# Patient Record
Sex: Female | Born: 1940 | Race: White | Hispanic: No | Smoking: Never smoker
Health system: Southern US, Community
[De-identification: ages and names within clinical notes are randomized; demographics above are authoritative.]

## PROBLEM LIST (undated history)

## (undated) DIAGNOSIS — F32A Depression, unspecified: Secondary | ICD-10-CM

## (undated) DIAGNOSIS — Z8489 Family history of other specified conditions: Secondary | ICD-10-CM

## (undated) DIAGNOSIS — Z87442 Personal history of urinary calculi: Secondary | ICD-10-CM

## (undated) DIAGNOSIS — R42 Dizziness and giddiness: Secondary | ICD-10-CM

## (undated) DIAGNOSIS — I10 Essential (primary) hypertension: Secondary | ICD-10-CM

## (undated) DIAGNOSIS — F329 Major depressive disorder, single episode, unspecified: Secondary | ICD-10-CM

## (undated) DIAGNOSIS — M1990 Unspecified osteoarthritis, unspecified site: Secondary | ICD-10-CM

## (undated) DIAGNOSIS — E785 Hyperlipidemia, unspecified: Secondary | ICD-10-CM

## (undated) DIAGNOSIS — E039 Hypothyroidism, unspecified: Secondary | ICD-10-CM

## (undated) HISTORY — DX: Essential (primary) hypertension: I10

## (undated) HISTORY — DX: Hyperlipidemia, unspecified: E78.5

---

## 2004-09-11 ENCOUNTER — Ambulatory Visit: Payer: Self-pay | Admitting: Unknown Physician Specialty

## 2005-02-19 LAB — HM COLONOSCOPY: HM Colonoscopy: NORMAL

## 2005-03-08 ENCOUNTER — Ambulatory Visit: Payer: Self-pay | Admitting: Unknown Physician Specialty

## 2005-03-15 ENCOUNTER — Ambulatory Visit: Payer: Self-pay | Admitting: Unknown Physician Specialty

## 2005-12-03 ENCOUNTER — Ambulatory Visit: Payer: Self-pay | Admitting: Unknown Physician Specialty

## 2006-06-07 ENCOUNTER — Ambulatory Visit: Payer: Self-pay | Admitting: Unknown Physician Specialty

## 2006-07-09 HISTORY — PX: HYSTEROSCOPY: SHX211

## 2007-06-18 ENCOUNTER — Ambulatory Visit: Payer: Self-pay | Admitting: Unknown Physician Specialty

## 2007-07-10 HISTORY — PX: BREAST BIOPSY: SHX20

## 2007-07-10 HISTORY — PX: BREAST SURGERY: SHX581

## 2007-07-10 HISTORY — PX: ROTATOR CUFF REPAIR: SHX139

## 2007-07-17 ENCOUNTER — Ambulatory Visit: Payer: Self-pay | Admitting: Surgery

## 2007-12-17 ENCOUNTER — Ambulatory Visit: Payer: Self-pay | Admitting: Surgery

## 2008-06-23 ENCOUNTER — Ambulatory Visit: Payer: Self-pay | Admitting: Unknown Physician Specialty

## 2009-06-29 ENCOUNTER — Ambulatory Visit: Payer: Self-pay | Admitting: Unknown Physician Specialty

## 2009-07-13 ENCOUNTER — Ambulatory Visit: Payer: Self-pay | Admitting: Unknown Physician Specialty

## 2010-07-19 ENCOUNTER — Ambulatory Visit: Payer: Self-pay | Admitting: Unknown Physician Specialty

## 2011-05-11 LAB — HM PAP SMEAR: HM Pap smear: NORMAL

## 2011-05-25 LAB — HM PAP SMEAR: HM Pap smear: NORMAL

## 2011-07-25 ENCOUNTER — Ambulatory Visit: Payer: Self-pay | Admitting: Unknown Physician Specialty

## 2011-07-25 LAB — HM MAMMOGRAPHY: HM Mammogram: NORMAL

## 2011-08-16 ENCOUNTER — Ambulatory Visit: Payer: Self-pay | Admitting: Internal Medicine

## 2012-02-20 ENCOUNTER — Ambulatory Visit (INDEPENDENT_AMBULATORY_CARE_PROVIDER_SITE_OTHER): Payer: BC Managed Care – PPO | Admitting: Internal Medicine

## 2012-02-20 ENCOUNTER — Encounter: Payer: Self-pay | Admitting: Internal Medicine

## 2012-02-20 VITALS — BP 122/66 | HR 66 | Temp 97.9°F | Resp 14 | Ht 61.0 in | Wt 161.0 lb

## 2012-02-20 DIAGNOSIS — E785 Hyperlipidemia, unspecified: Secondary | ICD-10-CM

## 2012-02-20 DIAGNOSIS — K061 Gingival enlargement: Secondary | ICD-10-CM

## 2012-02-20 DIAGNOSIS — I10 Essential (primary) hypertension: Secondary | ICD-10-CM

## 2012-02-20 DIAGNOSIS — K055 Other periodontal diseases: Secondary | ICD-10-CM

## 2012-02-20 NOTE — Progress Notes (Signed)
Patient ID: Mary Vasquez, female   DOB: December 14, 1940, 71 y.o.   MRN: [id number]   Patient Active Problem List  Diagnosis  . Hyperlipidemia  . Hypertension  . Gingival hyperplasia  . Hyperlipidemia LDL goal < 100    Subjective:  CC:   Chief Complaint  Patient presents with  . New Patient    HPI:   Mary Vasquez is a 71 y.o. female who presents as a new patient to establish primary care with the chief complaint of  gingival hyperplasia. Mary Vasquez is a delightful 71 year old white female who is Transferring  From [employer] after 20 years . She has ahHistory of hypertension and hyperlipidemia and was noted to have developed gingival hyperplasia recently by her dentist which has been attributed to amlodipine. She's been taking amlodipine for control of hypertension for the past 4 or 5 years prior trial of hydrochlorothiazide apparently caused dizziness. She continues to work part-time for [employer] in [place]. She has no other complaints today.   Past Medical History  Diagnosis Date  . Hyperlipidemia   . Hypertension     Past Surgical History  Procedure Date  . Hysteroscopy 2008  . Rotator cuff repair 2009    Ted Armour  . Breast surgery 2009    stereotactic,  left,  benign     Family History  Problem Relation Age of Onset  . Heart disease Mother 49    ami  . Heart disease Father     ami  . Diabetes Brother   . Cancer Brother     prostate ca    History   Social History  . Marital Status: Divorced    Spouse Name: N/A    Number of Children: N/A  . Years of Education: N/A   Occupational History  . Not on file.   Social History Main Topics  . Smoking status: Never Smoker   . Smokeless tobacco: Never Used  . Alcohol Use: 4.2 oz/week    7 Glasses of wine per week  . Drug Use: No  . Sexually Active: Not on file   Other Topics Concern  . Not on file   Social History Narrative  . No narrative on file    Allergies  Allergen Reactions  .  Augmentin (Amoxicillin-Pot Clavulanate) Hives    Review of Systems:   The remainder of the review of systems was negative except those addressed in the HPI.    Objective:  BP 122/66  Pulse 66  Temp 97.9 F (36.6 C) (Oral)  Resp 14  Ht 5\' 1"  (1.549 m)  Wt 161 lb (73.029 kg)  BMI 30.42 kg/m2  SpO2 96%  General appearance: alert, cooperative and appears stated age Ears: normal TM's and external ear canals both ears Throat: lips, mucosa, and tongue normal; teeth and gums normal Neck: no adenopathy, no carotid bruit, supple, symmetrical, trachea midline and thyroid not enlarged, symmetric, no tenderness/mass/nodules Back: symmetric, no curvature. ROM normal. No CVA tenderness. Lungs: clear to auscultation bilaterally Heart: regular rate and rhythm, S1, S2 normal, no murmur, click, rub or gallop Abdomen: soft, non-tender; bowel sounds normal; no masses,  no organomegaly Pulses: 2+ and symmetric Skin: Skin color, texture, turgor normal. No rashes or lesions Lymph nodes: Cervical, supraclavicular, and axillary nodes normal.  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 We have discontinued her amlodipine today. She will follow up with her dentist as directed.  Hypertension She would like to  try a three-month break from  any blood pressure medication and follow her blood pressures and resume a medication if indicated for blood pressure greater than 140/80. I have agreed to this.  Hyperlipidemia LDL goal < 100 She's been taking atorvastatin for primary prevention for years. She has no history of strokes TIAs or other disease or diabetes. She would like to suspend her medication and recheck her cholesterol in 3 months I have agreed to this.   Updated Medication List Outpatient Encounter Prescriptions as of 02/20/2012  Medication Sig Dispense Refill  . atorvastatin (LIPITOR) 10 MG tablet Take 10 mg by mouth daily.        . calcium carbonate (OS-CAL) 600 MG TABS Take 600 mg by mouth 2 (two) times daily with a meal.      . cetirizine (ZYRTEC) 10 MG tablet Take 10 mg by mouth daily.      . Cholecalciferol (VITAMIN D-3) 1000 UNITS CAPS Take by mouth 2 (two) times daily.      . Multiple Vitamin (MULTIVITAMIN) tablet Take 1 tablet by mouth daily.      Marland Kitchen DISCONTD: amLODipine (NORVASC) 5 MG tablet Take 5 mg by mouth daily.         Orders Placed This Encounter  Procedures  . HM MAMMOGRAPHY  . HM PAP SMEAR  . HM COLONOSCOPY    No Follow-up on file.

## 2012-02-20 NOTE — Patient Instructions (Addendum)
Suspend amlodipine  and atorvastatin  for 3 months (pending the urinalysis).  Strive for 30 minutes of exercise 5 days per week.   We will repeat fasting lipids,  CMET in 3 months   Consider a Low Glycemic Index Diet and eating 6 smaller meals daily .  This frequent feeding stimulates your metabolism and the lower glycemic index foods will lower your blood sugars:   This is an example of my daily  "Low GI"  Diet:  All of the foods can be found at grocery stores and in bulk at [employer]  club   7 AM Breakfast:  Low carbohydrate Protein  Shakes (I recommend the E[employer]vantEdge "Carb Control" shakes  Or the low carb shakes by Atkins.   Both are available everywhere:  In  cases at [employer]  Or in 4 packs at grocery stores and pharmacies  2.5 carbs  (Alternative is  a toasted Arnold's Sandwhich Thin w/ peanut butter, a "Bagel Thin" with cream cheese and salmon) or  a scrambled egg burrito made with a low carb tortilla .  Avoid cereal and bananas, oatmeal too unless the old fashioned kind that takes 30-40 minutes to prepare.  the rest is overly processed, has minimal fiber, and loaded with carbohydrates!   10 AM: Protein bar by Atkins (the snack size, under 200 cal.  There are many varieties , available widely again or in bulk in limited varieties at [employer])  Other so called "protein bars" tend to be loaded with carbohydrates.  Remember, in food advertising, the word "energy" is synonymous for " carbohydrate."  Lunch: sandwich of [place], (or any lunchmeat or canned tuna), fresh avocado and cheese on a lower carbohydrate pita bread, flatbread, or tortilla . Ok to use mayonnaise. The bread is the only source or carbohydrate that can be decreased (Joseph's makes a pita bread and a flat bread  Are 50 cal and 4 net carbs ; Toufayan makes a low carb flatbread 100 cal and 9 net carbs  and  Mission makes a low carb whole wheat tortilla  210 cal and 6 net carbs)  3 PM:  Mid day :  Another protein  bar,  Or a  cheese stick (100  cal, 0 carbs),  Or 1 ounce of  almonds, walnuts, pistachios, pecans, peanuts,  Macadamia nuts. Or a Dannon light n Fit greek yogurt, 80 cal 8 net carbs . Avoid "granola"; the dried cranberries and raisins are loaded with carbohydrates.    6 PM  Dinner:  "mean and green:"  Meat/chicken/fish or a high protein legume; , with a green salad, and a low GI  Veggie (broccoli, cauliflower, green beans, spinach, brussel sprouts. Lima beans) : Avoid "Low fat dressings, Reyne Dumas and [address]! They are loaded with sugar! Instead use ranch, vinagrette,  Blue cheese, etc  9 PM snack : Breyer's "low carb" fudgsicle or  ice cream bar (Carb Smart line), or  Weight Watcher's ice cream bar , or anouther "no sugar added" ice cream; or another protein shake or a serving of fresh fruit with whipped cream (Avoid bananas, pineapple, grapes  and watermelon on a regular basis because they are high in sugar)   Remember that snack Substitutions should be less than 15 to 20 carbs  Per serving. Remember to subtract fiber grams to get the "net carbs."

## 2012-02-21 ENCOUNTER — Encounter: Payer: Self-pay | Admitting: Internal Medicine

## 2012-02-21 DIAGNOSIS — E785 Hyperlipidemia, unspecified: Secondary | ICD-10-CM | POA: Insufficient documentation

## 2012-02-21 DIAGNOSIS — K061 Gingival enlargement: Secondary | ICD-10-CM | POA: Insufficient documentation

## 2012-02-21 NOTE — Assessment & Plan Note (Signed)
She's been taking atorvastatin for primary prevention for years. She has no history of strokes TIAs or other disease or diabetes. She would like to suspend her medication and recheck her cholesterol in 3 months I have agreed to this.

## 2012-02-21 NOTE — Assessment & Plan Note (Signed)
Found recently by her dentist with amlodipine cited as the cause. We have discontinued her amlodipine today. She will follow up with her dentist as directed.

## 2012-02-21 NOTE — Assessment & Plan Note (Signed)
She would like to try a three-month break from  any blood pressure medication and follow her blood pressures and resume a medication if indicated for blood pressure greater than 140/80. I have agreed to this.

## 2013-04-23 ENCOUNTER — Ambulatory Visit (INDEPENDENT_AMBULATORY_CARE_PROVIDER_SITE_OTHER): Payer: Medicare Other | Admitting: Internal Medicine

## 2013-04-23 ENCOUNTER — Encounter: Payer: Self-pay | Admitting: Internal Medicine

## 2013-04-23 ENCOUNTER — Encounter (INDEPENDENT_AMBULATORY_CARE_PROVIDER_SITE_OTHER): Payer: Self-pay

## 2013-04-23 VITALS — BP 132/72 | HR 64 | Temp 97.7°F | Resp 12 | Ht 61.5 in | Wt 157.5 lb

## 2013-04-23 DIAGNOSIS — E559 Vitamin D deficiency, unspecified: Secondary | ICD-10-CM

## 2013-04-23 DIAGNOSIS — Z1211 Encounter for screening for malignant neoplasm of colon: Secondary | ICD-10-CM

## 2013-04-23 DIAGNOSIS — I10 Essential (primary) hypertension: Secondary | ICD-10-CM

## 2013-04-23 DIAGNOSIS — E669 Obesity, unspecified: Secondary | ICD-10-CM

## 2013-04-23 DIAGNOSIS — Z23 Encounter for immunization: Secondary | ICD-10-CM

## 2013-04-23 DIAGNOSIS — A881 Epidemic vertigo: Secondary | ICD-10-CM

## 2013-04-23 DIAGNOSIS — R5381 Other malaise: Secondary | ICD-10-CM

## 2013-04-23 DIAGNOSIS — Z1239 Encounter for other screening for malignant neoplasm of breast: Secondary | ICD-10-CM

## 2013-04-23 DIAGNOSIS — M899 Disorder of bone, unspecified: Secondary | ICD-10-CM

## 2013-04-23 DIAGNOSIS — K061 Gingival enlargement: Secondary | ICD-10-CM

## 2013-04-23 DIAGNOSIS — K055 Other periodontal diseases: Secondary | ICD-10-CM

## 2013-04-23 DIAGNOSIS — M8580 Other specified disorders of bone density and structure, unspecified site: Secondary | ICD-10-CM

## 2013-04-23 DIAGNOSIS — E785 Hyperlipidemia, unspecified: Secondary | ICD-10-CM

## 2013-04-23 DIAGNOSIS — Z0000 Encounter for general adult medical examination without abnormal findings: Secondary | ICD-10-CM

## 2013-04-23 MED ORDER — ZOSTER VACCINE LIVE 19400 UNT/0.65ML ~~LOC~~ SOLR: 0.6500 mL | Freq: Once | SUBCUTANEOUS | Status: DC

## 2013-04-23 NOTE — Patient Instructions (Signed)
You had your annual Medicare wellness exam today  We will schedule your mammogram in December at [facility] with the 3d mammogram, and your DEXA scan at [facility]   I recommend taking the home fecal test  For your interim  colon CA screening test since your next colonoscopy is not due until 2014.  We will give it to you at your lab visit. .   I recommend the Shingles vaccine.  I have given you prescription for this because it will be cheaper at the health Dept or at your  local pharmacy because Medicare will not reimburse for them.   You received the pneumonia vaccine today.  Please return for fasting labs ASAP

## 2013-04-23 NOTE — Progress Notes (Signed)
Patient ID: Mary Vasquez, female   DOB: May 19, 1941, 72 y.o.   MRN: [id number]   The patient is here for annual Medicare wellness examination and management of other chronic and acute problems. 1)   1) History of obesity.  She is trying to lose weight .    Her nadir was 151 lbs in the summer but has gained back 5.  Not exercising as much due to work and family obligations and distractions.  Diet reviewed She is drinkjing Using smoothies (spinach, frozen berries, and flax seed) in ealry am.,   Then 10 am snack of nuts or berries.  Sleeps great ,  Bowels moving regularly  2( 2 recent  episodes of "vertigo" in the past 5 months.  Occurred at work , Urgent Care eval was normal .   given meclizine by Urgent Care. Cleared after 4 days.  2nd occurrence in August .  Sudden onset of room spinning .  Both episodes  occurred during time of seasonal allergies,  But takes zyrtec regularly.  no headache or vision changes associated. Due for eye exam . Sister had a brain tumor.    The risk factors are reflected in the social history.  The roster of all physicians providing medical care to patient - is listed in the Snapshot section of the chart.  Activities of daily living:  The patient is 100% independent in all ADLs: dressing, toileting, feeding as well as independent mobility  Home safety : The patient has smoke detectors in the home. They wear seatbelts.  There are no firearms at home. There is no violence in the home.   There is no risks for hepatitis, STDs or HIV. There is no   history of blood transfusion. They have no travel history to infectious disease endemic areas of the world.  The patient has seen their dentist in the last six month. They have seen their eye doctor in the last year. They admit to slight hearing difficulty with regard to whispered voices and some television programs.  They have deferred audiologic testing in the last year.  They do not  have excessive sun exposure. Discussed the need  for sun protection: hats, long sleeves and use of sunscreen if there is significant sun exposure.   Diet: the importance of a healthy diet is discussed. They do have a healthy diet.  The benefits of regular aerobic exercise were discussed. She walks 4 times per week ,  20 minutes.   Depression screen: there are no signs or vegative symptoms of depression- irritability, change in appetite, anhedonia, sadness/tearfullness.  Cognitive assessment: the patient manages all their financial and personal affairs and is actively engaged. They could relate day,date,year and events; recalled 2/3 objects at 3 minutes; performed clock-face test normally. Perfomed very well on the 1 minute naming tests   The following portions of the patient's history were reviewed and updated as appropriate: allergies, current medications, past family history, past medical history,  past surgical history, past social history  and problem list.  Visual acuity was not assessed per patient preference since she has regular follow up with her ophthalmologist. Hearing and body mass index were assessed and reviewed.   During the course of the visit the patient was educated and counseled about appropriate screening and preventive services including : fall prevention , diabetes screening, nutrition counseling, colorectal cancer screening, and recommended immunizations.    Objective:  BP 132/72  Pulse 64  Temp(Src) 97.7 F (36.5 C) (Oral)  Resp 12  Ht 5' 1.5" (1.562 m)  Wt 157 lb 8 oz (71.442 kg)  BMI 29.28 kg/m2  SpO2 98%  General Appearance:    Alert, cooperative, no distress, appears stated age  Head:    Normocephalic, without obvious abnormality, atraumatic  Eyes:    PERRL, conjunctiva/corneas clear, EOM's intact, fundi    benign, both eyes  Ears:    Normal TM's and external ear canals, both ears  Nose:   Nares normal, septum midline, mucosa normal, no drainage    or sinus tenderness  Throat:   Lips, mucosa, and  tongue normal; teeth and gums normal  Neck:   Supple, symmetrical, trachea midline, no adenopathy;    thyroid:  no enlargement/tenderness/nodules; no carotid   bruit or JVD  Back:     Symmetric, no curvature, ROM normal, no CVA tenderness  Lungs:     Clear to auscultation bilaterally, respirations unlabored  Chest Wall:    No tenderness or deformity   Heart:    Regular rate and rhythm, S1 and S2 normal, no murmur, rub   or gallop  Breast Exam:    No tenderness, masses, or nipple abnormality  Abdomen:     Soft, non-tender, bowel sounds active all four quadrants,    no masses, no organomegaly     Extremities:   Extremities normal, atraumatic, no cyanosis or edema  Pulses:   2+ and symmetric all extremities  Skin:   Skin color, texture, turgor normal, no rashes or lesions  Lymph nodes:   Cervical, supraclavicular, and axillary nodes normal  Neurologic:   CNII-XII intact, normal strength, sensation and reflexes    throughout   Assessment and Plan:  Hypertension Currently well-controlled without medications. Her home blood pressures have been averaging 134/74 without  medications which were  stopped due to gingival hyperplasia which was noted by her dentist.   Obesity, unspecified She has lost 4 pounds since her last visit over a year ago and her BMI is now 21.I have addressed  BMI and recommended a low glycemic index diet utilizing smaller more frequent meals to increase metabolism.  I have also recommended that patient start exercising with a goal of 30 minutes of aerobic exercise a minimum of 5 days per week. Screening for lipid disorders, thyroid and diabetes to be done today.    Gingival hyperplasia Improving with cessation of amlodipine for hypertensive therapy.  Hyperlipidemia LDL goal < 100 She suspended atorvastatin at last visit and is due to return for fasting lipids.'s been taking atorvastatin for primary prevention for years. She has no history of strokes TIAs or other  disease or diabetes.  Epidemic vertigo Previously described episodes appear to have been mediated by eustachian tube dysfunction and allergic rhinitis. There were no associated worrisome  symptoms of  changes in vision, chest pain or palpitations.  Encounter for preventive health examination Annual comprehensive exam was done including breast, excluding pelvic and PAP smear. All screenings have been addressed .    Updated Medication List Outpatient Encounter Prescriptions as of 04/23/2013  Medication Sig Dispense Refill  . calcium carbonate (OS-CAL) 600 MG TABS Take 600 mg by mouth 2 (two) times daily with a meal.      . cetirizine (ZYRTEC) 10 MG tablet Take 10 mg by mouth daily.      . Cholecalciferol (VITAMIN D-3) 1000 UNITS CAPS Take by mouth 2 (two) times daily.      . Multiple Vitamin (MULTIVITAMIN) tablet Take 1 tablet by mouth daily.      Marland Kitchen  zoster vaccine live, PF, (ZOSTAVAX) 16109 UNT/0.65ML injection Inject 19,400 Units into the skin once.  1 each  0  . [DISCONTINUED] atorvastatin (LIPITOR) 10 MG tablet Take 10 mg by mouth daily.       No facility-administered encounter medications on file as of 04/23/2013.

## 2013-04-23 NOTE — Assessment & Plan Note (Addendum)
Currently well-controlled without medications. Her home blood pressures have been averaging 134/74 without  medications which were  stopped due to gingival hyperplasia which was noted by her dentist.

## 2013-04-25 DIAGNOSIS — A881 Epidemic vertigo: Secondary | ICD-10-CM | POA: Insufficient documentation

## 2013-04-25 DIAGNOSIS — E663 Overweight: Secondary | ICD-10-CM | POA: Insufficient documentation

## 2013-04-25 DIAGNOSIS — E669 Obesity, unspecified: Secondary | ICD-10-CM | POA: Insufficient documentation

## 2013-04-25 DIAGNOSIS — Z01818 Encounter for other preprocedural examination: Secondary | ICD-10-CM | POA: Insufficient documentation

## 2013-04-25 NOTE — Assessment & Plan Note (Signed)
Improving with cessation of amlodipine for hypertensive therapy.

## 2013-04-25 NOTE — Assessment & Plan Note (Signed)
Previously described episodes appear to have been mediated by eustachian tube dysfunction and allergic rhinitis. There were no associated worrisome  symptoms of  changes in vision, chest pain or palpitations.

## 2013-04-25 NOTE — Assessment & Plan Note (Signed)
She has lost 4 pounds since her last visit over a year ago and her BMI is now 30.I have addressed  BMI and recommended a low glycemic index diet utilizing smaller more frequent meals to increase metabolism.  I have also recommended that patient start exercising with a goal of 30 minutes of aerobic exercise a minimum of 5 days per week. Screening for lipid disorders, thyroid and diabetes to be done today.

## 2013-04-25 NOTE — Assessment & Plan Note (Signed)
Annual comprehensive exam was done including breast, excluding pelvic and PAP smear. All screenings have been addressed .  

## 2013-04-25 NOTE — Assessment & Plan Note (Signed)
She suspended atorvastatin at last visit and is due to return for fasting lipids.'s been taking atorvastatin for primary prevention for years. She has no history of strokes TIAs or other disease or diabetes.

## 2013-04-30 ENCOUNTER — Other Ambulatory Visit (INDEPENDENT_AMBULATORY_CARE_PROVIDER_SITE_OTHER): Payer: Medicare Other

## 2013-04-30 DIAGNOSIS — E559 Vitamin D deficiency, unspecified: Secondary | ICD-10-CM

## 2013-04-30 DIAGNOSIS — E785 Hyperlipidemia, unspecified: Secondary | ICD-10-CM

## 2013-04-30 DIAGNOSIS — R5381 Other malaise: Secondary | ICD-10-CM

## 2013-04-30 LAB — CBC WITH DIFFERENTIAL/PLATELET
Basophils Relative: 1.1 % (ref 0.0–3.0)
Eosinophils Absolute: 0.2 10*3/uL (ref 0.0–0.7)
Eosinophils Relative: 5.1 % — ABNORMAL HIGH (ref 0.0–5.0)
HCT: 39.2 % (ref 36.0–46.0)
Lymphs Abs: 1.5 10*3/uL (ref 0.7–4.0)
MCHC: 34.2 g/dL (ref 30.0–36.0)
MCV: 91.2 fl (ref 78.0–100.0)
Monocytes Absolute: 0.3 10*3/uL (ref 0.1–1.0)
Neutro Abs: 1.6 10*3/uL (ref 1.4–7.7)
Neutrophils Relative %: 43.4 % (ref 43.0–77.0)
Platelets: 217 10*3/uL (ref 150.0–400.0)
RDW: 14 % (ref 11.5–14.6)

## 2013-04-30 LAB — COMPREHENSIVE METABOLIC PANEL
AST: 25 U/L (ref 0–37)
Albumin: 3.7 g/dL (ref 3.5–5.2)
Alkaline Phosphatase: 58 U/L (ref 39–117)
BUN: 11 mg/dL (ref 6–23)
Creatinine, Ser: 0.8 mg/dL (ref 0.4–1.2)
GFR: 70.93 mL/min (ref >60.00)
Glucose, Bld: 98 mg/dL (ref 70–99)
Potassium: 4.8 mEq/L (ref 3.5–5.1)
Sodium: 140 mEq/L (ref 135–145)
Total Bilirubin: 0.6 mg/dL (ref 0.3–1.2)
Total Protein: 6.5 g/dL (ref 6.0–8.3)

## 2013-04-30 LAB — LIPID PANEL
Cholesterol: 235 mg/dL — ABNORMAL HIGH (ref 0–200)
HDL: 59.4 mg/dL (ref >39.00)
Total CHOL/HDL Ratio: 4
Triglycerides: 80 mg/dL (ref 0.0–149.0)
VLDL: 16 mg/dL (ref 0.0–40.0)

## 2013-04-30 LAB — TSH: TSH: 3.78 u[IU]/mL (ref 0.35–5.50)

## 2013-05-03 ENCOUNTER — Encounter: Payer: Self-pay | Admitting: Internal Medicine

## 2013-05-04 MED ORDER — ATORVASTATIN CALCIUM 40 MG PO TABS: 40.0000 mg | ORAL_TABLET | Freq: Every day | ORAL | Status: DC

## 2013-06-12 ENCOUNTER — Telehealth: Payer: Self-pay | Admitting: Internal Medicine

## 2013-06-12 NOTE — Telephone Encounter (Signed)
Pt would like you to schedule her mammogram and dexa scan Please call pt

## 2013-06-17 ENCOUNTER — Encounter: Payer: Self-pay | Admitting: Internal Medicine

## 2013-06-17 ENCOUNTER — Encounter: Payer: Self-pay | Admitting: Emergency Medicine

## 2013-09-01 ENCOUNTER — Encounter: Payer: Self-pay | Admitting: Internal Medicine

## 2013-10-16 ENCOUNTER — Ambulatory Visit: Payer: Self-pay | Admitting: Internal Medicine

## 2013-12-03 ENCOUNTER — Encounter: Payer: Self-pay | Admitting: Internal Medicine

## 2014-06-14 ENCOUNTER — Ambulatory Visit (INDEPENDENT_AMBULATORY_CARE_PROVIDER_SITE_OTHER): Payer: Self-pay | Admitting: Internal Medicine

## 2014-06-14 ENCOUNTER — Encounter: Payer: Self-pay | Admitting: Internal Medicine

## 2014-06-14 VITALS — BP 140/82 | HR 68 | Temp 98.1°F | Resp 16 | Ht 62.0 in | Wt 163.0 lb

## 2014-06-14 DIAGNOSIS — R21 Rash and other nonspecific skin eruption: Secondary | ICD-10-CM

## 2014-06-14 DIAGNOSIS — N6310 Unspecified lump in the right breast, unspecified quadrant: Secondary | ICD-10-CM | POA: Insufficient documentation

## 2014-06-14 DIAGNOSIS — E669 Obesity, unspecified: Secondary | ICD-10-CM

## 2014-06-14 DIAGNOSIS — E559 Vitamin D deficiency, unspecified: Secondary | ICD-10-CM

## 2014-06-14 DIAGNOSIS — Z1159 Encounter for screening for other viral diseases: Secondary | ICD-10-CM

## 2014-06-14 DIAGNOSIS — N63 Unspecified lump in breast: Secondary | ICD-10-CM

## 2014-06-14 DIAGNOSIS — Z0000 Encounter for general adult medical examination without abnormal findings: Secondary | ICD-10-CM

## 2014-06-14 DIAGNOSIS — E785 Hyperlipidemia, unspecified: Secondary | ICD-10-CM

## 2014-06-14 DIAGNOSIS — Z23 Encounter for immunization: Secondary | ICD-10-CM

## 2014-06-14 LAB — COMPREHENSIVE METABOLIC PANEL
ALBUMIN: 4.2 g/dL (ref 3.5–5.2)
ALK PHOS: 64 U/L (ref 39–117)
ALT: 27 U/L (ref 0–35)
AST: 36 U/L (ref 0–37)
BILIRUBIN TOTAL: 0.7 mg/dL (ref 0.2–1.2)
BUN: 16 mg/dL (ref 6–23)
CO2: 27 mEq/L (ref 19–32)
Calcium: 9.3 mg/dL (ref 8.4–10.5)
Chloride: 105 mEq/L (ref 96–112)
Creatinine, Ser: 0.9 mg/dL (ref 0.4–1.2)
GFR: 65.29 mL/min (ref >60.00)
Glucose, Bld: 114 mg/dL — ABNORMAL HIGH (ref 70–99)
POTASSIUM: 4.8 meq/L (ref 3.5–5.1)
SODIUM: 139 meq/L (ref 135–145)
Total Protein: 7 g/dL (ref 6.0–8.3)

## 2014-06-14 LAB — LIPID PANEL
CHOLESTEROL: 169 mg/dL (ref 0–200)
HDL: 63.6 mg/dL (ref >39.00)
LDL Cholesterol: 89 mg/dL (ref 0–99)
NonHDL: 105.4
TRIGLYCERIDES: 80 mg/dL (ref 0.0–149.0)
Total CHOL/HDL Ratio: 3
VLDL: 16 mg/dL (ref 0.0–40.0)

## 2014-06-14 LAB — VITAMIN D 25 HYDROXY (VIT D DEFICIENCY, FRACTURES): VITD: 32.99 ng/mL (ref 30.00–100.00)

## 2014-06-14 LAB — TSH: TSH: 3.75 u[IU]/mL (ref 0.35–4.50)

## 2014-06-14 MED ORDER — TRIAMCINOLONE ACETONIDE 0.1 % EX CREA: 1.0000 "application " | TOPICAL_CREAM | Freq: Two times a day (BID) | CUTANEOUS | Status: DC

## 2014-06-14 NOTE — Addendum Note (Signed)
Addended by: Dennie BibleAVIS, Tramaine Snell R on: 06/14/2014 10:11 AM   Modules accepted: Orders

## 2014-06-14 NOTE — Assessment & Plan Note (Signed)
She hs been inconsistent with use of lipitor,  No intolerances noted. Repeat labs due today

## 2014-06-14 NOTE — Patient Instructions (Addendum)
You had your annual Medicare wellness exam today  We will schedule your mammogram soon and refer you to Dr Kellie Moor for the recurring rash.  You can use the ointment twice daily,  And add benadryl at bedtime for the itching.  You received the pneumonia vaccine today.  We will contact you with the bloodwork results   Health Maintenance Adopting a healthy lifestyle and getting preventive care can go a long way to promote health and wellness. Talk with your health care provider about what schedule of regular examinations is right for you. This is a good chance for you to check in with your provider about disease prevention and staying healthy. In between checkups, there are plenty of things you can do on your own. Experts have done a lot of research about which lifestyle changes and preventive measures are most likely to keep you healthy. Ask your health care provider for more information. WEIGHT AND DIET  Eat a healthy diet  Be sure to include plenty of vegetables, fruits, low-fat dairy products, and lean protein.  Do not eat a lot of foods high in solid fats, added sugars, or salt.  Get regular exercise. This is one of the most important things you can do for your health.  Most adults should exercise for at least 150 minutes each week. The exercise should increase your heart rate and make you sweat (moderate-intensity exercise).  Most adults should also do strengthening exercises at least twice a week. This is in addition to the moderate-intensity exercise.  Maintain a healthy weight  Body mass index (BMI) is a measurement that can be used to identify possible weight problems. It estimates body fat based on height and weight. Your health care provider can help determine your BMI and help you achieve or maintain a healthy weight.  For females 20 years of age and older:   A BMI below 18.5 is considered underweight.  A BMI of 18.5 to 24.9 is normal.  A BMI of 25 to 29.9 is considered  overweight.  A BMI of 30 and above is considered obese.  Watch levels of cholesterol and blood lipids  You should start having your blood tested for lipids and cholesterol at 72 years of age, then have this test every 5 years.  You may need to have your cholesterol levels checked more often if:  Your lipid or cholesterol levels are high.  You are older than 73 years of age.  You are at high risk for heart disease.  CANCER SCREENING   Lung Cancer  Lung cancer screening is recommended for adults 65-48 years old who are at high risk for lung cancer because of a history of smoking.  A yearly low-dose CT scan of the lungs is recommended for people who:  Currently smoke.  Have quit within the past 15 years.  Have at least a 30-pack-year history of smoking. A pack year is smoking an average of one pack of cigarettes a day for 1 year.  Yearly screening should continue until it has been 15 years since you quit.  Yearly screening should stop if you develop a health problem that would prevent you from having lung cancer treatment.  Breast Cancer  Practice breast self-awareness. This means understanding how your breasts normally appear and feel.  It also means doing regular breast self-exams. Let your health care provider know about any changes, no matter how small.  If you are in your 20s or 30s, you should have a clinical breast exam (  care provider every 1-3 years as part of a regular health exam.  If you are 40 or older, have a CBE every year. Also consider having a breast X-ray (mammogram) every year.  If you have a family history of breast cancer, talk to your health care provider about genetic screening.  If you are at high risk for breast cancer, talk to your health care provider about having an MRI and a mammogram every year.  Breast cancer gene (BRCA) assessment is recommended for women who have family members with BRCA-related cancers. BRCA-related  cancers include:  Breast.  Ovarian.  Tubal.  Peritoneal cancers.  Results of the assessment will determine the need for genetic counseling and BRCA1 and BRCA2 testing. Cervical Cancer Routine pelvic examinations to screen for cervical cancer are no longer recommended for nonpregnant women who are considered low risk for cancer of the pelvic organs (ovaries, uterus, and vagina) and who do not have symptoms. A pelvic examination may be necessary if you have symptoms including those associated with pelvic infections. Ask your health care provider if a screening pelvic exam is right for you.   The Pap test is the screening test for cervical cancer for women who are considered at risk.  If you had a hysterectomy for a problem that was not cancer or a condition that could lead to cancer, then you no longer need Pap tests.  If you are older than 65 years, and you have had normal Pap tests for the past 10 years, you no longer need to have Pap tests.  If you have had past treatment for cervical cancer or a condition that could lead to cancer, you need Pap tests and screening for cancer for at least 20 years after your treatment.  If you no longer get a Pap test, assess your risk factors if they change (such as having a new sexual partner). This can affect whether you should start being screened again.  Some women have medical problems that increase their chance of getting cervical cancer. If this is the case for you, your health care provider may recommend more frequent screening and Pap tests.  The human papillomavirus (HPV) test is another test that may be used for cervical cancer screening. The HPV test looks for the virus that can cause cell changes in the cervix. The cells collected during the Pap test can be tested for HPV.  The HPV test can be used to screen women 30 years of age and older. Getting tested for HPV can extend the interval between normal Pap tests from three to five  years.  An HPV test also should be used to screen women of any age who have unclear Pap test results.  After 72 years of age, women should have HPV testing as often as Pap tests.  Colorectal Cancer  This type of cancer can be detected and often prevented.  Routine colorectal cancer screening usually begins at 72 years of age and continues through 72 years of age.  Your health care provider may recommend screening at an earlier age if you have risk factors for colon cancer.  Your health care provider may also recommend using home test kits to check for hidden blood in the stool.  A small camera at the end of a tube can be used to examine your colon directly (sigmoidoscopy or colonoscopy). This is done to check for the earliest forms of colorectal cancer.  Routine screening usually begins at age 50.  Direct examination of the   Direct examination of the colon should be repeated every 5-10 years through 72 years of age. However, you may need to be screened more often if early forms of precancerous polyps or small growths are found. Skin Cancer  Check your skin from head to toe regularly.  Tell your health care provider about any new moles or changes in moles, especially if there is a change in a mole's shape or color.  Also tell your health care provider if you have a mole that is larger than the size of a pencil eraser.  Always use sunscreen. Apply sunscreen liberally and repeatedly throughout the day.  Protect yourself by wearing long sleeves, pants, a wide-brimmed hat, and sunglasses whenever you are outside. HEART DISEASE, DIABETES, AND HIGH BLOOD PRESSURE   Have your blood pressure checked at least every 1-2 years. High blood pressure causes heart disease and increases the risk of stroke.  If you are between 66 years and 34 years old, ask your health care provider if you should take aspirin to prevent strokes.  Have regular diabetes screenings. This involves taking a blood sample to check your fasting  blood sugar level.  If you are at a normal weight and have a low risk for diabetes, have this test once every three years after 72 years of age.  If you are overweight and have a high risk for diabetes, consider being tested at a younger age or more often. PREVENTING INFECTION  Hepatitis B  If you have a higher risk for hepatitis B, you should be screened for this virus. You are considered at high risk for hepatitis B if:  You were born in a country where hepatitis B is common. Ask your health care provider which countries are considered high risk.  Your parents were born in a high-risk country, and you have not been immunized against hepatitis B (hepatitis B vaccine).  You have HIV or AIDS.  You use needles to inject street drugs.  You live with someone who has hepatitis B.  You have had sex with someone who has hepatitis B.  You get hemodialysis treatment.  You take certain medicines for conditions, including cancer, organ transplantation, and autoimmune conditions. Hepatitis C  Blood testing is recommended for:  Everyone born from [birth year] through 1964.  Anyone with known risk factors for hepatitis C. Sexually transmitted infections (STIs)  You should be screened for sexually transmitted infections (STIs) including gonorrhea and chlamydia if:  You are sexually active and are younger than 72 years of age.  You are older than 72 years of age and your health care provider tells you that you are at risk for this type of infection.  Your sexual activity has changed since you were last screened and you are at an increased risk for chlamydia or gonorrhea. Ask your health care provider if you are at risk.  If you do not have HIV, but are at risk, it may be recommended that you take a prescription medicine daily to prevent HIV infection. This is called pre-exposure prophylaxis (PrEP). You are considered at risk if:  You are sexually active and do not regularly use condoms or know  the HIV status of your partner(s).  You take drugs by injection.  You are sexually active with a partner who has HIV. Talk with your health care provider about whether you are at high risk of being infected with HIV. If you choose to begin PrEP, you should first be tested for HIV. You should then be  tested every 3 months for as long as you are taking PrEP.  PREGNANCY   If you are premenopausal and you may become pregnant, ask your health care provider about preconception counseling.  If you may become pregnant, take 400 to 800 micrograms (mcg) of folic acid every day.  If you want to prevent pregnancy, talk to your health care provider about birth control (contraception). OSTEOPOROSIS AND MENOPAUSE   Osteoporosis is a disease in which the bones lose minerals and strength with aging. This can result in serious bone fractures. Your risk for osteoporosis can be identified using a bone density scan.  If you are 65 years of age or older, or if you are at risk for osteoporosis and fractures, ask your health care provider if you should be screened.  Ask your health care provider whether you should take a calcium or vitamin D supplement to lower your risk for osteoporosis.  Menopause may have certain physical symptoms and risks.  Hormone replacement therapy may reduce some of these symptoms and risks. Talk to your health care provider about whether hormone replacement therapy is right for you.  HOME CARE INSTRUCTIONS   Schedule regular health, dental, and eye exams.  Stay current with your immunizations.   Do not use any tobacco products including cigarettes, chewing tobacco, or electronic cigarettes.  If you are pregnant, do not drink alcohol.  If you are breastfeeding, limit how much and how often you drink alcohol.  Limit alcohol intake to no more than 1 drink per day for nonpregnant women. One drink equals 12 ounces of beer, 5 ounces of wine, or 1 ounces of hard liquor.  Do not  use street drugs.  Do not share needles.  Ask your health care provider for help if you need support or information about quitting drugs.  Tell your health care provider if you often feel depressed.  Tell your health care provider if you have ever been abused or do not feel safe at home. Document Released: 01/08/2011 Document Revised: 11/09/2013 Document Reviewed: 05/27/2013 Exit[employer] Patient Information 2015 [employer]. This information is not intended to replace advice given to you by your health care provider. Make sure you discuss any questions you have with your health care provider.  

## 2014-06-14 NOTE — Progress Notes (Signed)
Patient ID: Mary Vasquez, female   DOB: May 05, 1941, 73 y.o.   MRN: [id number]    The patient is here for annual Medicare wellness examination and management of other chronic and acute problems.  She has been having a pruritic facial rash since last Friday. [facility] for an allergic reaction when it first occurred in September with prednisone taper and Cereve cream since the first occurrence occurred after pulling up some carpet at home.  She states that the rash never completely  Resolved.  Last Friday developed swollen lips and swelling around the eyes,  Intense itching ,  Also involving eyes, and backs of hand.    Thanksgiving week she had  a viral URI with laryngitis,  Has been taking zyrtec and mucinex.  No new medications,  Creams ,  Detergents or supplements,  No new  moisturizers.  Had hair colored on Thursday    Not taking lipitor reguarly ,  Not exercisig due to increased workload.  Working 3 days per week at [employer] .  Not exercising the other 4 days .  likes to walk but  has just gotten out of the habit .   The risk factors are reflected in the social history.  The roster of all physicians providing medical care to patient - is listed in the Snapshot section of the chart.  Activities of daily living:  The patient is 100% independent in all ADLs: dressing, toileting, feeding as well as independent mobility  Home safety : The patient has smoke detectors in the home. They wear seatbelts.  There are no firearms at home. There is no violence in the home.   There is no risks for hepatitis, STDs or HIV. There is no   history of blood transfusion. They have no travel history to infectious disease endemic areas of the world.  The patient has seen their dentist in the last six month. They have seen their eye doctor in the last year. They admit to slight hearing difficulty with regard to whispered voices and some television programs.  They have deferred audiologic testing in the  last year.  They do not  have excessive sun exposure. Discussed the need for sun protection: hats, long sleeves and use of sunscreen if there is significant sun exposure.   Diet: the importance of a healthy diet is discussed. They do have a healthy diet.  The benefits of regular aerobic exercise were discussed. She walks 4 times per week ,  20 minutes.   Depression screen: there are no signs or vegative symptoms of depression- irritability, change in appetite, anhedonia, sadness/tearfullness.  Cognitive assessment: the patient manages all their financial and personal affairs and is actively engaged. They could relate day,date,year and events; recalled 2/3 objects at 3 minutes; performed clock-face test normally.  The following portions of the patient's history were reviewed and updated as appropriate: allergies, current medications, past family history, past medical history,  past surgical history, past social history  and problem list.  Visual acuity was not assessed per patient preference since she has regular follow up with her ophthalmologist. Hearing and body mass index were assessed and reviewed.   During the course of the visit the patient was educated and counseled about appropriate screening and preventive services including : fall prevention , diabetes screening, nutrition counseling, colorectal cancer screening, and recommended immunizations.    Objective: BP 140/82 mmHg  Pulse 68  Temp(Src) 98.1 F (36.7 C) (Oral)  Resp 16  Ht 5'  2" (1.575 m)  Wt 163 lb (73.936 kg)  BMI 29.81 kg/m2  SpO2 98%   General appearance: alert, cooperative and appears stated age Head: Normocephalic, without obvious abnormality, atraumatic Eyes: conjunctivae/corneas clear. PERRL, EOM's intact. Fundi benign. Ears: normal TM's and external ear canals both ears Nose: Nares normal. Septum midline. Mucosa normal. No drainage or sinus tenderness. Throat: lips, mucosa, and tongue normal; teeth and gums  normal Neck: no adenopathy, no carotid bruit, no JVD, supple, symmetrical, trachea midline and thyroid not enlarged, symmetric, no tenderness/mass/nodules Lungs: clear to auscultation bilaterally Breasts: normal appearance, small nodule right breast upper outer quadrant. Left breast normal Heart: regular rate and rhythm, S1, S2 normal, no murmur, click, rub or gallop Abdomen: soft, non-tender; bowel sounds normal; no masses,  no organomegaly Extremities: extremities normal, atraumatic, no cyanosis or edema Pulses: 2+ and symmetric Skin: mulitple wheals on temples,  Skin flaking around eyes and lips. Petechial rash on dorsum of hands  Neurologic: Alert and oriented X 3, normal strength and tone. Normal symmetric reflexes. Normal coordination and gait.   Assessment and Plan:  Breast mass, right Right upper outer quadrant.  Prior lefdt breast biopsy was normla fibrocyustic disesa.e   Rash and nonspecific skin eruption checking CBC and ANA.   Triamcinolone ointment to use bid.  Referral to [facility]  Encounter for preventive health examination Annual Medicare wellness  exam was done as well as a comprehensive physical exam and management of acute and chronic conditions .  During the course of the visit the patient was educated and counseled about appropriate screening and preventive services including : fall prevention , diabetes screening, nutrition counseling, colorectal cancer screening, and recommended immunizations.  Printed recommendations for health maintenance screenings was given.   Hyperlipidemia with target LDL less than 100 She hs been inconsistent with use of lipitor,  No intolerances noted. Repeat labs due today    Updated Medication List Outpatient Encounter Prescriptions as of 06/14/2014  Medication Sig  . atorvastatin (LIPITOR) 40 MG tablet Take 1 tablet (40 mg total) by mouth daily.  . calcium carbonate (OS-CAL) 600 MG TABS Take 600 mg by mouth 2 (two) times  daily with a meal.  . cetirizine (ZYRTEC) 10 MG tablet Take 10 mg by mouth daily.  . Cholecalciferol (VITAMIN D-3) 1000 UNITS CAPS Take by mouth 2 (two) times daily.  . Multiple Vitamin (MULTIVITAMIN) tablet Take 1 tablet by mouth daily.  Marland Kitchen. triamcinolone cream (KENALOG) 0.1 % Apply 1 application topically 2 (two) times daily. Until rash has resolved  . zoster vaccine live, PF, (ZOSTAVAX) [id number] UNT/0.65ML injection Inject 19,400 Units into the skin once. (Patient not taking: Reported on 06/14/2014)

## 2014-06-14 NOTE — Assessment & Plan Note (Signed)

## 2014-06-14 NOTE — Assessment & Plan Note (Addendum)
checking CBC and ANA.   Triamcinolone ointment to use bid.  Referral to [facility]

## 2014-06-14 NOTE — Assessment & Plan Note (Signed)
Right upper outer quadrant.  Prior lefdt breast biopsy was normla fibrocyustic disesa.e

## 2014-06-15 LAB — HEPATITIS C ANTIBODY: HCV Ab: NEGATIVE

## 2014-06-15 LAB — ANA W/REFLEX IF POSITIVE: Anti Nuclear Antibody(ANA): NEGATIVE

## 2014-06-15 NOTE — Addendum Note (Signed)
Addended by: Jameel Quant L on: 06/15/2014 12:11 PM   Modules accepted: Level of Service  

## 2014-06-16 ENCOUNTER — Telehealth: Payer: Self-pay | Admitting: *Deleted

## 2014-06-16 ENCOUNTER — Encounter: Payer: Self-pay | Admitting: Internal Medicine

## 2014-06-16 NOTE — Telephone Encounter (Signed)
Our lab called pt cbc (purple top) hemolyzed need a re-collect, left a voice mail

## 2014-06-17 ENCOUNTER — Other Ambulatory Visit: Payer: Self-pay | Admitting: Internal Medicine

## 2014-06-17 ENCOUNTER — Other Ambulatory Visit: Payer: Commercial Managed Care - HMO

## 2014-06-17 DIAGNOSIS — N6459 Other signs and symptoms in breast: Secondary | ICD-10-CM

## 2014-06-17 DIAGNOSIS — N6310 Unspecified lump in the right breast, unspecified quadrant: Secondary | ICD-10-CM

## 2014-06-17 LAB — CBC WITH DIFFERENTIAL/PLATELET
Basophils Absolute: 0.1 10*3/uL (ref 0.0–0.1)
Basophils Relative: 0.9 % (ref 0.0–3.0)
Eosinophils Absolute: 0.2 10*3/uL (ref 0.0–0.7)
Eosinophils Relative: 2.6 % (ref 0.0–5.0)
HEMATOCRIT: 39.7 % (ref 36.0–46.0)
Hemoglobin: 13.1 g/dL (ref 12.0–15.0)
Lymphocytes Relative: 35.3 % (ref 12.0–46.0)
Lymphs Abs: 2.2 10*3/uL (ref 0.7–4.0)
MCHC: 33 g/dL (ref 30.0–36.0)
MCV: 91 fl (ref 78.0–100.0)
MONO ABS: 0.5 10*3/uL (ref 0.1–1.0)
Monocytes Relative: 8.4 % (ref 3.0–12.0)
Neutro Abs: 3.3 10*3/uL (ref 1.4–7.7)
Neutrophils Relative %: 52.8 % (ref 43.0–77.0)
PLATELETS: 223 10*3/uL (ref 150.0–400.0)
RBC: 4.36 Mil/uL (ref 3.87–5.11)
RDW: 13 % (ref 11.5–15.5)
WBC: 6.2 10*3/uL (ref 4.0–10.5)

## 2014-06-17 NOTE — Telephone Encounter (Signed)
Patient notified.  Lab appointment scheduled for today.

## 2014-06-17 NOTE — Patient Instructions (Signed)
Recollect cbc 

## 2014-06-18 ENCOUNTER — Encounter: Payer: Self-pay | Admitting: Internal Medicine

## 2014-06-25 ENCOUNTER — Ambulatory Visit: Payer: Self-pay | Admitting: Internal Medicine

## 2014-07-29 ENCOUNTER — Encounter: Payer: Self-pay | Admitting: Internal Medicine

## 2014-10-22 ENCOUNTER — Ambulatory Visit
Admit: 2014-10-22 | Disposition: A | Discharge status: Home / Self Care | Payer: Self-pay | Attending: Internal Medicine | Admitting: Internal Medicine

## 2014-10-22 LAB — HM MAMMOGRAPHY: HM Mammogram: NEGATIVE

## 2014-10-25 ENCOUNTER — Encounter: Payer: Self-pay | Admitting: *Deleted

## 2015-08-16 ENCOUNTER — Emergency Department
Admission: EM | Admit: 2015-08-16 | Discharge: 2015-08-16 | Disposition: A | Discharge status: Home / Self Care | Payer: PPO | Attending: Emergency Medicine | Admitting: Emergency Medicine

## 2015-08-16 ENCOUNTER — Encounter: Payer: Self-pay | Admitting: Emergency Medicine

## 2015-08-16 DIAGNOSIS — I10 Essential (primary) hypertension: Secondary | ICD-10-CM | POA: Diagnosis not present

## 2015-08-16 DIAGNOSIS — R110 Nausea: Secondary | ICD-10-CM

## 2015-08-16 DIAGNOSIS — R197 Diarrhea, unspecified: Secondary | ICD-10-CM | POA: Diagnosis not present

## 2015-08-16 DIAGNOSIS — R42 Dizziness and giddiness: Secondary | ICD-10-CM | POA: Diagnosis not present

## 2015-08-16 DIAGNOSIS — N390 Urinary tract infection, site not specified: Secondary | ICD-10-CM | POA: Diagnosis not present

## 2015-08-16 DIAGNOSIS — R0789 Other chest pain: Secondary | ICD-10-CM | POA: Diagnosis not present

## 2015-08-16 DIAGNOSIS — Z79899 Other long term (current) drug therapy: Secondary | ICD-10-CM | POA: Diagnosis not present

## 2015-08-16 LAB — URINALYSIS COMPLETE WITH MICROSCOPIC (ARMC ONLY)
BILIRUBIN URINE: NEGATIVE
Bacteria, UA: NONE SEEN
HGB URINE DIPSTICK: NEGATIVE
Ketones, ur: NEGATIVE mg/dL
NITRITE: NEGATIVE
Protein, ur: NEGATIVE mg/dL
SPECIFIC GRAVITY, URINE: 1.012 (ref 1.005–1.030)
pH: 5 (ref 5.0–8.0)

## 2015-08-16 LAB — BASIC METABOLIC PANEL
Anion gap: 5 (ref 5–15)
BUN: 16 mg/dL (ref 6–20)
CALCIUM: 9.6 mg/dL (ref 8.9–10.3)
CHLORIDE: 104 mmol/L (ref 101–111)
CO2: 30 mmol/L (ref 22–32)
CREATININE: 0.98 mg/dL (ref 0.44–1.00)
GFR, EST NON AFRICAN AMERICAN: 56 mL/min — AB (ref >60)
Glucose, Bld: 147 mg/dL — ABNORMAL HIGH (ref 65–99)
POTASSIUM: 3.3 mmol/L — AB (ref 3.5–5.1)
SODIUM: 139 mmol/L (ref 135–145)

## 2015-08-16 LAB — CBC
HCT: 37.6 % (ref 35.0–47.0)
Hemoglobin: 13 g/dL (ref 12.0–16.0)
MCH: 30.8 pg (ref 26.0–34.0)
MCHC: 34.6 g/dL (ref 32.0–36.0)
MCV: 88.8 fL (ref 80.0–100.0)
PLATELETS: 214 10*3/uL (ref 150–440)
RBC: 4.23 MIL/uL (ref 3.80–5.20)
RDW: 13.4 % (ref 11.5–14.5)
WBC: 5.2 10*3/uL (ref 3.6–11.0)

## 2015-08-16 LAB — TROPONIN I

## 2015-08-16 MED ORDER — MECLIZINE HCL 25 MG PO TABS: 25.0000 mg | ORAL_TABLET | Freq: Three times a day (TID) | ORAL | Status: DC | PRN

## 2015-08-16 MED ORDER — CEPHALEXIN 500 MG PO CAPS: 500.0000 mg | ORAL_CAPSULE | Freq: Three times a day (TID) | ORAL | Status: DC

## 2015-08-16 NOTE — Discharge Instructions (Signed)
Please seek medical attention for any high fevers, chest pain, shortness of breath, change in behavior, persistent vomiting, bloody stool or any other new or concerning symptoms. ° ° °Urinary Tract Infection °Urinary tract infections (UTIs) can develop anywhere along your urinary tract. Your urinary tract is your body's drainage system for removing wastes and extra water. Your urinary tract includes two kidneys, two ureters, a bladder, and a urethra. Your kidneys are a pair of bean-shaped organs. Each kidney is about the size of your fist. They are located below your ribs, one on each side of your spine. °CAUSES °Infections are caused by microbes, which are microscopic organisms, including fungi, viruses, and bacteria. These organisms are so small that they can only be seen through a microscope. Bacteria are the microbes that most commonly cause UTIs. °SYMPTOMS  °Symptoms of UTIs may vary by age and gender of the patient and by the location of the infection. Symptoms in young women typically include a frequent and intense urge to urinate and a painful, burning feeling in the bladder or urethra during urination. Older women and men are more likely to be tired, shaky, and weak and have muscle aches and abdominal pain. A fever may mean the infection is in your kidneys. Other symptoms of a kidney infection include pain in your back or sides below the ribs, nausea, and vomiting. °DIAGNOSIS °To diagnose a UTI, your caregiver will ask you about your symptoms. Your caregiver will also ask you to provide a urine sample. The urine sample will be tested for bacteria and white blood cells. White blood cells are made by your body to help fight infection. °TREATMENT  °Typically, UTIs can be treated with medication. Because most UTIs are caused by a bacterial infection, they usually can be treated with the use of antibiotics. The choice of antibiotic and length of treatment depend on your symptoms and the type of bacteria causing  your infection. °HOME CARE INSTRUCTIONS °· If you were prescribed antibiotics, take them exactly as your caregiver instructs you. Finish the medication even if you feel better after you have only taken some of the medication. °· Drink enough water and fluids to keep your urine clear or pale yellow. °· Avoid caffeine, tea, and carbonated beverages. They tend to irritate your bladder. °· Empty your bladder often. Avoid holding urine for long periods of time. °· Empty your bladder before and after sexual intercourse. °· After a bowel movement, women should cleanse from front to back. Use each tissue only once. °SEEK MEDICAL CARE IF:  °· You have back pain. °· You develop a fever. °· Your symptoms do not begin to resolve within 3 days. °SEEK IMMEDIATE MEDICAL CARE IF:  °· You have severe back pain or lower abdominal pain. °· You develop chills. °· You have nausea or vomiting. °· You have continued burning or discomfort with urination. °MAKE SURE YOU:  °· Understand these instructions. °· Will watch your condition. °· Will get help right away if you are not doing well or get worse. °  °This information is not intended to replace advice given to you by your health care provider. Make sure you discuss any questions you have with your health care provider. °  °Document Released: 04/04/2005 Document Revised: 03/16/2015 Document Reviewed: 08/03/2011 °Elsevier Interactive Patient Education ©2016 [employer] ° °

## 2015-08-16 NOTE — ED Notes (Signed)
Pt to ed with c/o dizziness today, elevated blood pressure, "heaviness in chest nover the last few weeks but denies today" pt denies sob.

## 2015-08-16 NOTE — ED Provider Notes (Signed)
[facility] Emergency Department Provider Note    ____________________________________________  Time seen: ~1730  I have reviewed the triage vital signs and the nursing notes.   HISTORY  Chief Complaint Dizziness   History limited by: Not Limited   HPI Mary Vasquez is a 75 y.o. female who comes in to the emergency department today because of concerns for nausea and dizziness. The patient states that she started feeling dizzy over the weekend. The patient also had some nausea. She had a couple episodes of diarrhea. She felt that she had a stomach bug. She tried to go to work today but still continued to have episodes of dizziness. She states the dizziness is worse when she gets up or moves around. It resolves after lying back down. She has an episode of vertigo last fall and states that this is similar. The patient denies any fevers.   She does state that for a number of weeks she has had occasional chest pressure when she wakes up. Has not had any associated shortness of breath or diaphoresis with the chest pain. None today.   Past Medical History  Diagnosis Date  . Hyperlipidemia   . Hypertension     Patient Active Problem List   Diagnosis Date Noted  . Breast mass, right 06/14/2014  . Rash and nonspecific skin eruption 06/14/2014  . Obesity, unspecified 04/25/2013  . Epidemic vertigo 04/25/2013  . Encounter for preventive health examination 04/25/2013  . 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 Hyperlipidemia with target LDL less than 100 02/21/2012  . Hypertension     Past Surgical History  Procedure Laterality Date  . Hysteroscopy  2008  . Rotator cuff repair  2009    [facility]  . Breast surgery  2009    stereotactic,  left,  benign     Current Outpatient Rx  Name  Route  Sig  Dispense  Refill  . atorvastatin (LIPITOR) 40 MG tablet   Oral   Take 1 tablet (40 mg total) by mouth daily.   90 tablet   3   . calcium carbonate (OS-CAL) 600  MG TABS   Oral   Take 600 mg by mouth 2 (two) times daily with a meal.         . cetirizine (ZYRTEC) 10 MG tablet   Oral   Take 10 mg by mouth daily.         . Cholecalciferol (VITAMIN D-3) 1000 UNITS CAPS   Oral   Take by mouth 2 (two) times daily.         . Multiple Vitamin (MULTIVITAMIN) tablet   Oral   Take 1 tablet by mouth daily.         Marland Kitchen triamcinolone cream (KENALOG) 0.1 %   Topical   Apply 1 application topically 2 (two) times daily. Until rash has resolved   30 g   2   . zoster vaccine live, PF, (ZOSTAVAX) 16109 UNT/0.65ML injection   Subcutaneous   Inject 19,400 Units into the skin once. Patient not taking: Reported on 06/14/2014   1 each   0     Allergies Augmentin  Family History  Problem Relation Age of Onset  . Heart disease Mother 54    ami  . Heart disease Father     ami  . Diabetes Brother   . Cancer Brother     prostate ca    Social History Social History  Substance Use Topics  . Smoking status: Never Smoker   .  Smokeless tobacco: Never Used  . Alcohol Use: 4.2 oz/week    7 Glasses of wine per week    Review of Systems   Constitutional: Negative for fever. Cardiovascular: Positive for occasional chest pressure. Respiratory: Negative for shortness of breath. Gastrointestinal: Negative for abdominal pain, vomiting and diarrhea. Neurological: Negative for headaches, focal weakness or numbness.  10-point ROS otherwise negative.  ____________________________________________   PHYSICAL EXAM:  VITAL SIGNS: ED Triage Vitals  Enc Vitals Group     BP 08/16/15 1540 166/86 mmHg     Pulse Rate 08/16/15 1540 73     Resp 08/16/15 1540 18     Temp 08/16/15 1540 97.4 F (36.3 C)     Temp Source 08/16/15 1540 Oral     SpO2 08/16/15 1540 98 %     Weight 08/16/15 1540 159 lb (72.122 kg)     Height 08/16/15 1540  (1.549 m)     Head Cir --      Peak Flow --      Pain Score 08/16/15 1540 0   Constitutional: Alert and  oriented. Well appearing and in no distress. Eyes: Conjunctivae are normal. PERRL. Normal extraocular movements. ENT   Head: Normocephalic and atraumatic.   Nose: No congestion/rhinnorhea.   Mouth/Throat: Mucous membranes are moist.   Neck: No stridor. Hematological/Lymphatic/Immunilogical: No cervical lymphadenopathy. Cardiovascular: Normal rate, regular rhythm.  No murmurs, rubs, or gallops. Respiratory: Normal respiratory effort without tachypnea nor retractions. Breath sounds are clear and equal bilaterally. No wheezes/rales/rhonchi. Gastrointestinal: Soft and nontender. No distention.  Genitourinary: Deferred Musculoskeletal: Normal range of motion in all extremities. No joint effusions.  No lower extremity tenderness nor edema. Neurologic:  Normal speech and language. No gross focal neurologic deficits are appreciated.  Skin:  Skin is warm, dry and intact. No rash noted. Psychiatric: Mood and affect are normal. Speech and behavior are normal. Patient exhibits appropriate insight and judgment.  ____________________________________________    LABS (pertinent positives/negatives)  Labs Reviewed  BASIC METABOLIC PANEL - Abnormal; Notable for the following:    Potassium 3.3 (*)    Glucose, Bld 147 (*)    GFR calc non Af Amer 56 (*)    All other components within normal limits  URINALYSIS COMPLETEWITH MICROSCOPIC ([facility] ONLY) - Abnormal; Notable for the following:    Color, Urine YELLOW (*)    APPearance CLEAR (*)    Glucose, UA >500 (*)    Leukocytes, UA 2+ (*)    Squamous Epithelial / LPF 0-5 (*)    All other components within normal limits  CBC  TROPONIN I  CBG MONITORING, ED     ____________________________________________   EKG  I, Phineas Semen, attending physician, personally viewed and interpreted this EKG  EKG Time: 1543 Rate: 70 Rhythm: sinus rhythm with premature supraventricular contraction Axis: normal Intervals: qtc 403 QRS: narrow ST  changes: no st elevation Impression: abnormal ekg   ____________________________________________    RADIOLOGY  None ____________________________________________   PROCEDURES  Procedure(s) performed: None  Critical Care performed: No  ____________________________________________   INITIAL IMPRESSION / ASSESSMENT AND PLAN / ED COURSE  Pertinent labs & imaging results that were available during my care of the patient were reviewed by me and considered in my medical decision making (see chart for details).  Patient presented to the emergency department today because of dizziness and nausea. Blood work without any obvious etiology of the patient's symptoms. Patient's urine was consistent with urinary tract infection. Will plan on treating however will send off  for culture as well. Furthermore patient does have history of vertigo and her symptoms are somewhat vertiginous given worsening with changing of position. Will give meclizine. Do not think dizziness and nausea be secondary to a central lesion at this point given transient nature of symptoms. Encourage primary care follow-up.  ____________________________________________   FINAL CLINICAL IMPRESSION(S) / ED DIAGNOSES  Final diagnoses:  Dizziness  Nausea  UTI (lower urinary tract infection)     Phineas Semen, MD 08/16/15 4098

## 2015-08-16 NOTE — ED Notes (Signed)

## 2015-08-18 LAB — URINE CULTURE: CULTURE: NO GROWTH

## 2015-09-09 ENCOUNTER — Ambulatory Visit (INDEPENDENT_AMBULATORY_CARE_PROVIDER_SITE_OTHER): Payer: PPO | Admitting: Internal Medicine

## 2015-09-09 ENCOUNTER — Encounter: Payer: Self-pay | Admitting: Internal Medicine

## 2015-09-09 VITALS — BP 146/92 | HR 91 | Temp 98.2°F | Resp 14 | Ht 62.0 in | Wt 158.8 lb

## 2015-09-09 DIAGNOSIS — E559 Vitamin D deficiency, unspecified: Secondary | ICD-10-CM

## 2015-09-09 DIAGNOSIS — R5383 Other fatigue: Secondary | ICD-10-CM

## 2015-09-09 DIAGNOSIS — Z0000 Encounter for general adult medical examination without abnormal findings: Secondary | ICD-10-CM

## 2015-09-09 DIAGNOSIS — E785 Hyperlipidemia, unspecified: Secondary | ICD-10-CM | POA: Diagnosis not present

## 2015-09-09 DIAGNOSIS — I10 Essential (primary) hypertension: Secondary | ICD-10-CM

## 2015-09-09 DIAGNOSIS — Z7289 Other problems related to lifestyle: Secondary | ICD-10-CM

## 2015-09-09 DIAGNOSIS — R739 Hyperglycemia, unspecified: Secondary | ICD-10-CM | POA: Diagnosis not present

## 2015-09-09 DIAGNOSIS — Z1239 Encounter for other screening for malignant neoplasm of breast: Secondary | ICD-10-CM

## 2015-09-09 LAB — LIPID PANEL
CHOL/HDL RATIO: 3
Cholesterol: 196 mg/dL (ref 0–200)
HDL: 77.3 mg/dL (ref >39.00)
LDL CALC: 96 mg/dL (ref 0–99)
NONHDL: 118.66
TRIGLYCERIDES: 114 mg/dL (ref 0.0–149.0)
VLDL: 22.8 mg/dL (ref 0.0–40.0)

## 2015-09-09 LAB — LDL CHOLESTEROL, DIRECT: Direct LDL: 91 mg/dL

## 2015-09-09 LAB — COMPREHENSIVE METABOLIC PANEL
ALT: 17 U/L (ref 0–35)
AST: 27 U/L (ref 0–37)
Albumin: 4.6 g/dL (ref 3.5–5.2)
Alkaline Phosphatase: 54 U/L (ref 39–117)
BUN: 12 mg/dL (ref 6–23)
CHLORIDE: 103 meq/L (ref 96–112)
CO2: 27 mEq/L (ref 19–32)
CREATININE: 1.16 mg/dL (ref 0.40–1.20)
Calcium: 10 mg/dL (ref 8.4–10.5)
GFR: 48.55 mL/min — ABNORMAL LOW (ref >60.00)
GLUCOSE: 112 mg/dL — AB (ref 70–99)
POTASSIUM: 4.2 meq/L (ref 3.5–5.1)
SODIUM: 140 meq/L (ref 135–145)
Total Bilirubin: 0.7 mg/dL (ref 0.2–1.2)
Total Protein: 7.5 g/dL (ref 6.0–8.3)

## 2015-09-09 LAB — VITAMIN D 25 HYDROXY (VIT D DEFICIENCY, FRACTURES): VITD: 25.29 ng/mL — ABNORMAL LOW (ref 30.00–100.00)

## 2015-09-09 LAB — HEMOGLOBIN A1C: Hgb A1c MFr Bld: 5.8 % (ref 4.6–6.5)

## 2015-09-09 LAB — TSH: TSH: 2.02 u[IU]/mL (ref 0.35–4.50)

## 2015-09-09 NOTE — Progress Notes (Signed)
Patient ID: Mary Vasquez, female    DOB: [birth year]  Age: 75 y.o. MRN: [id number]  The patient is here for annual  examination and management of other chronic and acute problems. Last seen Dec 2015  Due for colonoscopy, mammogram in April and had DEXA 2015 cologuard preferred  Thurs/fri preferred for mammogram  takes a daily probiotic  Has stopped lipitor,  Has a healthy diet.  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  Treated for UTI    The risk factors are reflected in the social history.  The roster of all physicians providing medical care to patient - is listed in the Snapshot section of the chart.  Activities of daily living:  The patient is 100% independent in all ADLs: dressing, toileting, feeding as well as independent mobility  Home safety : The patient has smoke detectors in the home. They wear seatbelts.  There are no firearms at home. There is no violence in the home.   There is no risks for hepatitis, STDs or HIV. There is no   history of blood transfusion. They have no travel history to infectious disease endemic areas of the world.  The patient has seen their dentist in the last six month. They have seen their eye doctor in the last year. They admit to slight hearing difficulty with regard to whispered voices and some television programs.  They have deferred audiologic testing in the last year.  They do not  have excessive sun exposure. Discussed the need for sun protection: hats, long sleeves and use of sunscreen if there is significant sun exposure.   Diet: the importance of a healthy diet is discussed. They do have a healthy diet.  The benefits of regular aerobic exercise were discussed. She walks 4 times per week ,  20 minutes.   Depression screen: there are no  signs or vegative symptoms of depression- irritability, change in appetite, anhedonia, sadness/tearfullness.  Cognitive assessment: the patient manages all their financial and personal affairs and is actively engaged. They could relate day,date,year and events; recalled 2/3 objects at 3 minutes; performed clock-face test normally.  The following portions of the patient's history were reviewed and updated as appropriate: allergies, current medications, past family history, past medical history,  past surgical history, past social history  and problem list.  Visual acuity was not assessed per patient preference since she has regular follow up with her ophthalmologist. Hearing and body mass index were assessed and reviewed.   During the course of the visit the patient was educated and counseled about appropriate screening and preventive services including : fall prevention , diabetes screening, nutrition counseling, colorectal cancer screening, and recommended immunizations.    CC: The primary encounter diagnosis was Hyperglycemia. Diagnoses of Other problems related to lifestyle, Vitamin D deficiency, Other fatigue, Hyperlipidemia, Breast cancer screening, Encounter for preventive health examination, Essential hypertension, and Hyperlipidemia with target LDL less than 100 were also pertinent to this visit.  History Mary Vasquez has a past medical history of Hyperlipidemia and Hypertension.   She has past surgical history that includes Hysteroscopy (2008); Rotator cuff repair (2009); and Breast surgery (2009).   Her family history includes Cancer in her brother; Diabetes in her brother; Heart disease in her father; Heart disease (age of onset:  65) in her mother.She reports that she has never smoked. She has never used smokeless tobacco. She reports that she drinks about 4.2 oz of alcohol per week. She reports that she does not use illicit drugs.  Outpatient Prescriptions Prior to Visit  Medication Sig  Dispense Refill  . atorvastatin (LIPITOR) 40 MG tablet Take 1 tablet (40 mg total) by mouth daily. 90 tablet 3  . calcium carbonate (OS-CAL) 600 MG TABS Take 600 mg by mouth 2 (two) times daily with a meal.    . cetirizine (ZYRTEC) 10 MG tablet Take 10 mg by mouth daily.    . Cholecalciferol (VITAMIN D-3) 1000 UNITS CAPS Take by mouth 2 (two) times daily.    . meclizine (ANTIVERT) 25 MG tablet Take 1 tablet (25 mg total) by mouth 3 (three) times daily as needed for dizziness. 30 tablet 0  . Multiple Vitamin (MULTIVITAMIN) tablet Take 1 tablet by mouth daily.    Marland Kitchen triamcinolone cream (KENALOG) 0.1 % Apply 1 application topically 2 (two) times daily. Until rash has resolved 30 g 2  . zoster vaccine live, PF, (ZOSTAVAX) 16109 UNT/0.65ML injection Inject 19,400 Units into the skin once. 1 each 0  . cephALEXin (KEFLEX) 500 MG capsule Take 1 capsule (500 mg total) by mouth 3 (three) times daily. 30 capsule 0   No facility-administered medications prior to visit.    Review of Systems   Patient denies headache, fevers, malaise, unintentional weight loss, skin rash, eye pain, sinus congestion and sinus pain, sore throat, dysphagia,  hemoptysis , cough, dyspnea, wheezing, chest pain, palpitations, orthopnea, edema, abdominal pain, nausea, melena, diarrhea, constipation, flank pain, dysuria, hematuria, urinary  Frequency, nocturia, numbness, tingling, seizures,  Focal weakness, Loss of consciousness,  Tremor, insomnia, depression, anxiety, and suicidal ideation.      Objective:  BP 146/92 mmHg  Pulse 91  Temp(Src) 98.2 F (36.8 C) (Oral)  Resp 14  Ht  (1.575 m)  Wt 158 lb 12.8 oz (72.031 kg)  BMI 29.04 kg/m2  SpO2 97%  Physical Exam  General appearance: alert, cooperative and appears stated age Head: Normocephalic, without obvious abnormality, atraumatic Eyes: conjunctivae/corneas clear. PERRL, EOM's intact. Fundi benign. Ears: normal TM's and external ear canals both ears Nose: Nares  normal. Septum midline. Mucosa normal. No drainage or sinus tenderness. Throat: lips, mucosa, and tongue normal; teeth and gums normal Neck: no adenopathy, no carotid bruit, no JVD, supple, symmetrical, trachea midline and thyroid not enlarged, symmetric, no tenderness/mass/nodules Lungs: clear to auscultation bilaterally Breasts: normal appearance, no masses or tenderness Heart: regular rate and rhythm, S1, S2 normal, no murmur, click, rub or gallop Abdomen: soft, non-tender; bowel sounds normal; no masses,  no organomegaly Extremities: extremities normal, atraumatic, no cyanosis or edema Pulses: 2+ and symmetric Skin: Skin color, texture, turgor normal. No rashes or lesions Neurologic: Alert and oriented X 3, normal strength and tone. Normal symmetric reflexes. Normal coordination and gait.    Assessment & Plan:   Problem List Items Addressed This Visit    Hypertension    Well controlled on current regimen. Renal function stable, no changes today.  Lab Results  Component Value Date   CREATININE 1.16 09/09/2015   Lab Results  Component Value Date   NA 140 09/09/2015   K 4.2 09/09/2015   CL 103 09/09/2015   CO2 27 09/09/2015         Hyperlipidemia with target LDL less than 100    Now managed with diet per patient preference.  Lab  Results  Component Value Date   CHOL 196 09/09/2015   HDL 77.30 09/09/2015   LDLCALC 96 09/09/2015   LDLDIRECT 91.0 09/09/2015   TRIG 114.0 09/09/2015   CHOLHDL 3 09/09/2015         Encounter for preventive health examination    Annual comprehensive preventive exam was done as well as an evaluation and management of chronic conditions .  During the course of the visit the patient was educated and counseled about appropriate screening and preventive services including :  diabetes screening, lipid analysis with projected  10 year  risk for CAD , nutrition counseling, breast, cervical and colorectal cancer screening, and recommended  immunizations.  Printed recommendations for health maintenance screenings was given.       Other Visit Diagnoses    Hyperglycemia    -  Primary    Relevant Orders    Hemoglobin A1c (Completed)    Other problems related to lifestyle        Relevant Orders    Comprehensive metabolic panel (Completed)    Hepatitis C antibody (Completed)    HIV antibody (Completed)    Vitamin D deficiency        Relevant Orders    VITAMIN D 25 Hydroxy (Vit-D Deficiency, Fractures) (Completed)    Other fatigue        Relevant Orders    Comprehensive metabolic panel (Completed)    TSH (Completed)    Hyperlipidemia        Relevant Orders    LDL cholesterol, direct (Completed)    Lipid panel (Completed)    Breast cancer screening        Relevant Orders    MM DIGITAL SCREENING BILATERAL       I have discontinued Mary Vasquez's cephALEXin. I am also having her maintain her Vitamin D-3, calcium carbonate, multivitamin, cetirizine, zoster vaccine live (PF), atorvastatin, triamcinolone cream, and meclizine.  No orders of the defined types were placed in this encounter.    Medications Discontinued During This Encounter  Medication Reason  . cephALEXin (KEFLEX) 500 MG capsule Error    Follow-up: No Follow-up on file.   Sherlene Shams, MD

## 2015-09-09 NOTE — Progress Notes (Signed)
Pre visit review using our clinic review tool, if applicable. No additional management support is needed unless otherwise documented below in the visit note. 

## 2015-09-09 NOTE — Patient Instructions (Addendum)
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  Nevertheless, your  risk of fracture in the next  10 yrs is about slightly elevated, so I recommend weight bearing exercise, a goal intake of 1200 mg calcium through diet /supplements ,  A minimum of  1000 units Vit D daily  And we will repeat your DEXA scan in 5 years . If there has been significant progression, we will discuss medical therapy   I recommend getting the majority of your calcium and Vitamin D  through dietary sources rather than supplements given the recent association of calcium supplements with increased coronary artery calcium scores    . Try the almond, soy  and cashew milks that most grocery stores  now carry  in the dairy  Section>   They are lactose and cholelsterol free   Try the mashed cauliflower and riced cauliflower dishes instead of rice and mashed potatoes  Mashed turnips are also very low carb!   Try Oikos Triple Zero [place] Yogurt in the salted caramel, and the coffee flavors  With Whipped Cream for dessert   Use carrot juice in moderation  Because of the sugar. Apple juice would be lower carb.   To make a low carb chip :  Take the Joseph[employer] Lavash or Pita bread,  Or the [place] Low carb whole wheat tortilla   Place on metal cookie sheet  Brush with olive oil  Sprinkle garlic powder (NOT garlic salt), grated parmesan cheese, mediterranean seasoning , or all of them?  Bake at 225 or 250 for 90 minutes   Cologuard and mammogram will be set up for you  Menopause is a normal process in which your reproductive ability comes to an end. This process happens gradually over a span of months to years, usually between the ages of 46 and 65. Menopause is complete when you have missed 12 consecutive menstrual periods. It is important to talk with your health care provider about some of the most common conditions that affect  postmenopausal women, such as heart disease, cancer, and bone loss (osteoporosis). Adopting a healthy lifestyle and getting preventive care can help to promote your health and wellness. Those actions can also lower your chances of developing some of these common conditions. WHAT SHOULD I KNOW ABOUT MENOPAUSE? During menopause, you may experience a number of symptoms, such as:  Moderate-to-severe hot flashes.  Night sweats.  Decrease in sex drive.  Mood swings.  Headaches.  Tiredness.  Irritability.  Memory problems.  Insomnia. Choosing to treat or not to treat menopausal changes is an individual decision that you make with your health care provider. WHAT SHOULD I KNOW ABOUT HORMONE REPLACEMENT THERAPY AND SUPPLEMENTS? Hormone therapy products are effective for treating symptoms that are associated with menopause, such as hot flashes and night sweats. Hormone replacement carries certain risks, especially as you become older. If you are thinking about using estrogen or estrogen with progestin treatments, discuss the benefits and risks with your health care provider. WHAT SHOULD I KNOW ABOUT HEART DISEASE AND STROKE? Heart disease, heart attack, and stroke become more likely as you age. This may be due, in part, to the hormonal changes that your body experiences during menopause. These can affect how your body processes dietary fats, triglycerides, and cholesterol. Heart attack and stroke are both medical emergencies. There are many things that you can do to help prevent  heart disease and stroke:  Have your blood pressure checked at least every 1-2 years. High blood pressure causes heart disease and increases the risk of stroke.  If you are 85-58 years old, ask your health care provider if you should take aspirin to prevent a heart attack or a stroke.  Do not use any tobacco products, including cigarettes, chewing tobacco, or electronic cigarettes. If you need help quitting, ask your  health care provider.  It is important to eat a healthy diet and maintain a healthy weight.  Be sure to include plenty of vegetables, fruits, low-fat dairy products, and lean protein.  Avoid eating foods that are high in solid fats, added sugars, or salt (sodium).  Get regular exercise. This is one of the most important things that you can do for your health.  Try to exercise for at least 150 minutes each week. The type of exercise that you do should increase your heart rate and make you sweat. This is known as moderate-intensity exercise.  Try to do strengthening exercises at least twice each week. Do these in addition to the moderate-intensity exercise.  Know your numbers.Ask your health care provider to check your cholesterol and your blood glucose. Continue to have your blood tested as directed by your health care provider. WHAT SHOULD I KNOW ABOUT CANCER SCREENING? There are several types of cancer. Take the following steps to reduce your risk and to catch any cancer development as early as possible. Breast Cancer  Practice breast self-awareness.  This means understanding how your breasts normally appear and feel.  It also means doing regular breast self-exams. Let your health care provider know about any changes, no matter how small.  If you are 78 or older, have a clinician do a breast exam (clinical breast exam or CBE) every year. Depending on your age, family history, and medical history, it may be recommended that you also have a yearly breast X-ray (mammogram).  If you have a family history of breast cancer, talk with your health care provider about genetic screening.  If you are at high risk for breast cancer, talk with your health care provider about having an MRI and a mammogram every year.  Breast cancer (BRCA) gene test is recommended for women who have family members with BRCA-related cancers. Results of the assessment will determine the need for genetic counseling  and BRCA1 and for BRCA2 testing. BRCA-related cancers include these types:  Breast. This occurs in males or females.  Ovarian.  Tubal. This may also be called fallopian tube cancer.  Cancer of the abdominal or pelvic lining (peritoneal cancer).  Prostate.  Pancreatic. Cervical, Uterine, and Ovarian Cancer Your health care provider may recommend that you be screened regularly for cancer of the pelvic organs. These include your ovaries, uterus, and vagina. This screening involves a pelvic exam, which includes checking for microscopic changes to the surface of your cervix (Pap test).  For women ages 21-65, health care providers may recommend a pelvic exam and a Pap test every three years. For women ages 39-65, they may recommend the Pap test and pelvic exam, combined with testing for human papilloma virus (HPV), every five years. Some types of HPV increase your risk of cervical cancer. Testing for HPV may also be done on women of any age who have unclear Pap test results.  Other health care providers may not recommend any screening for nonpregnant women who are considered low risk for pelvic cancer and have no symptoms. Ask your health  care provider if a screening pelvic exam is right for you.  If you have had past treatment for cervical cancer or a condition that could lead to cancer, you need Pap tests and screening for cancer for at least 20 years after your treatment. If Pap tests have been discontinued for you, your risk factors (such as having a new sexual partner) need to be reassessed to determine if you should start having screenings again. Some women have medical problems that increase the chance of getting cervical cancer. In these cases, your health care provider may recommend that you have screening and Pap tests more often.  If you have a family history of uterine cancer or ovarian cancer, talk with your health care provider about genetic screening.  If you have vaginal bleeding  after reaching menopause, tell your health care provider.  There are currently no reliable tests available to screen for ovarian cancer. Lung Cancer Lung cancer screening is recommended for adults 18-60 years old who are at high risk for lung cancer because of a history of smoking. A yearly low-dose CT scan of the lungs is recommended if you:  Currently smoke.  Have a history of at least 30 pack-years of smoking and you currently smoke or have quit within the past 15 years. A pack-year is smoking an average of one pack of cigarettes per day for one year. Yearly screening should:  Continue until it has been 15 years since you quit.  Stop if you develop a health problem that would prevent you from having lung cancer treatment. Colorectal Cancer  This type of cancer can be detected and can often be prevented.  Routine colorectal cancer screening usually begins at age 73 and continues through age 73.  If you have risk factors for colon cancer, your health care provider may recommend that you be screened at an earlier age.  If you have a family history of colorectal cancer, talk with your health care provider about genetic screening.  Your health care provider may also recommend using home test kits to check for hidden blood in your stool.  A small camera at the end of a tube can be used to examine your colon directly (sigmoidoscopy or colonoscopy). This is done to check for the earliest forms of colorectal cancer.  Direct examination of the colon should be repeated every 5-10 years until age 43. However, if early forms of precancerous polyps or small growths are found or if you have a family history or genetic risk for colorectal cancer, you may need to be screened more often. Skin Cancer  Check your skin from head to toe regularly.  Monitor any moles. Be sure to tell your health care provider:  About any new moles or changes in moles, especially if there is a change in a mole's shape  or color.  If you have a mole that is larger than the size of a pencil eraser.  If any of your family members has a history of skin cancer, especially at a young age, talk with your health care provider about genetic screening.  Always use sunscreen. Apply sunscreen liberally and repeatedly throughout the day.  Whenever you are outside, protect yourself by wearing long sleeves, pants, a wide-brimmed hat, and sunglasses. WHAT SHOULD I KNOW ABOUT OSTEOPOROSIS? Osteoporosis is a condition in which bone destruction happens more quickly than new bone creation. After menopause, you may be at an increased risk for osteoporosis. To help prevent osteoporosis or the bone fractures that can  happen because of osteoporosis, the following is recommended:  If you are 36-8 years old, get at least 1,000 mg of calcium and at least 600 mg of vitamin D per day.  If you are older than age 61 but younger than age 18, get at least 1,200 mg of calcium and at least 600 mg of vitamin D per day.  If you are older than age 33, get at least 1,200 mg of calcium and at least 800 mg of vitamin D per day. Smoking and excessive alcohol intake increase the risk of osteoporosis. Eat foods that are rich in calcium and vitamin D, and do weight-bearing exercises several times each week as directed by your health care provider. WHAT SHOULD I KNOW ABOUT HOW MENOPAUSE AFFECTS [place]? Depression may occur at any age, but it is more common as you become older. Common symptoms of depression include:  Low or sad mood.  Changes in sleep patterns.  Changes in appetite or eating patterns.  Feeling an overall lack of motivation or enjoyment of activities that you previously enjoyed.  Frequent crying spells. Talk with your health care provider if you think that you are experiencing depression. WHAT SHOULD I KNOW ABOUT IMMUNIZATIONS? It is important that you get and maintain your immunizations. These include:  Tetanus,  diphtheria, and pertussis (Tdap) booster vaccine.  Influenza every year before the flu season begins.  Pneumonia vaccine.  Shingles vaccine. Your health care provider may also recommend other immunizations.   This information is not intended to replace advice given to you by your health care provider. Make sure you discuss any questions you have with your health care provider.   Document Released: 08/17/2005 Document Revised: 07/16/2014 Document Reviewed: 02/25/2014 Elsevier Interactive Patient Education [employer].

## 2015-09-10 LAB — HEPATITIS C ANTIBODY: HCV AB: NEGATIVE

## 2015-09-10 LAB — HIV ANTIBODY (ROUTINE TESTING W REFLEX): HIV: NONREACTIVE

## 2015-09-10 NOTE — Assessment & Plan Note (Signed)
Now managed with diet per patient preference.  Lab Results  Component Value Date   CHOL 196 09/09/2015   HDL 77.30 09/09/2015   LDLCALC 96 09/09/2015   LDLDIRECT 91.0 09/09/2015   TRIG 114.0 09/09/2015   CHOLHDL 3 09/09/2015

## 2015-09-10 NOTE — Assessment & Plan Note (Signed)
Well controlled on current regimen. Renal function stable, no changes today.  Lab Results  Component Value Date   CREATININE 1.16 09/09/2015   Lab Results  Component Value Date   NA 140 09/09/2015   K 4.2 09/09/2015   CL 103 09/09/2015   CO2 27 09/09/2015

## 2015-09-10 NOTE — Assessment & Plan Note (Signed)
Annual comprehensive preventive exam was done as well as an evaluation and management of chronic conditions .  During the course of the visit the patient was educated and counseled about appropriate screening and preventive services including :  diabetes screening, lipid analysis with projected  10 year  risk for CAD , nutrition counseling, breast, cervical and colorectal cancer screening, and recommended immunizations.  Printed recommendations for health maintenance screenings was given 

## 2015-09-12 ENCOUNTER — Encounter: Payer: Self-pay | Admitting: Internal Medicine

## 2015-09-12 NOTE — Progress Notes (Signed)
Cologuard ordered

## 2015-09-12 NOTE — Addendum Note (Signed)
Addended by: Dennie BibleAVIS, Sayid Moll R on: 09/12/2015 06:48 PM   Modules accepted: Kipp BroodSmartSet

## 2015-09-30 DIAGNOSIS — Z1212 Encounter for screening for malignant neoplasm of rectum: Secondary | ICD-10-CM | POA: Diagnosis not present

## 2015-09-30 DIAGNOSIS — Z1211 Encounter for screening for malignant neoplasm of colon: Secondary | ICD-10-CM | POA: Diagnosis not present

## 2015-09-30 LAB — COLOGUARD: COLOGUARD: NEGATIVE

## 2015-10-07 ENCOUNTER — Encounter: Payer: Self-pay | Admitting: Internal Medicine

## 2015-10-07 ENCOUNTER — Ambulatory Visit (INDEPENDENT_AMBULATORY_CARE_PROVIDER_SITE_OTHER): Payer: PPO | Admitting: Family Medicine

## 2015-10-07 ENCOUNTER — Encounter: Payer: Self-pay | Admitting: Family Medicine

## 2015-10-07 VITALS — BP 132/86 | HR 73 | Temp 97.7°F | Ht 62.0 in | Wt 163.4 lb

## 2015-10-07 DIAGNOSIS — J0100 Acute maxillary sinusitis, unspecified: Secondary | ICD-10-CM

## 2015-10-07 MED ORDER — DOXYCYCLINE HYCLATE 100 MG PO TABS: 100.0000 mg | ORAL_TABLET | Freq: Two times a day (BID) | ORAL | Status: DC

## 2015-10-07 NOTE — Assessment & Plan Note (Signed)
Sinus pressure and congestion with ear congestion and vertigo likely consistent with sinusitis. Suspect given duration and lack of improvement this is bacterial in nature. Patient's vertigo was reproduced on Dix-Hallpike maneuver and she did have horizontal nystagmus on the left making one wonder if BPPV could be playing a role in this. She had minimal imbalance on Romberg though is able to maintain her position. Doubt central nervous system lesion. We'll treat sinus infection with doxycycline given Augmentin allergy. She will take a probiotic while on this. I provided her with Epley maneuvers to do at home for her vertigo. She will continue Zyrtec and Flonase. She'll continue meclizine as needed. If vertigo does not improve with treatment of sinus infection and persists despite improvement in other symptoms would consider further workup of vertigo. She's given return precautions.

## 2015-10-07 NOTE — Progress Notes (Signed)
Patient ID: Mary Vasquez, female   DOB: [birth year], 75 y.o.   MRN: [id number]  Marikay Alar, MD Phone: [phone]  Mary Vasquez is a 75 y.o. female who presents today for same-day visit.  Patient notes has had left maxillary sinus pressure and congestion for about the last week. She notes her nose feels dry though she has maxillary sinus pressure. She does note some ear congestion on the left side as well. The sinus pressure and congestion is not improving. She also notes having vertigo. She intermittently gets a spinning sensation. Mostly this occurs when changing positions though can occur when sitting still. She has a history of vertigo in the past. Notes her last episode was in February. She notes then she was evaluated in the emergency room and treated for UTI and placed on meclizine. She notes she rested and got better though the vertigo still mildly persisted. She denies numbness, weakness, and vision changes. No chest pain or shortness of breath or palpitations. She's been taking Zyrtec and Flonase and meclizine. The meclizine is helpful. She does state her sister had a brain tumor many years ago.  PMH: nonsmoker.   ROS see history of present illness  Objective  Physical Exam Filed Vitals:   10/07/15 1428  BP: 132/86  Pulse: 73  Temp: 97.7 F (36.5 C)    BP Readings from Last 3 Encounters:  10/07/15 132/86  09/09/15 146/92  08/16/15 152/58   Wt Readings from Last 3 Encounters:  10/07/15 163 lb 6.4 oz (74.118 kg)  09/09/15 158 lb 12.8 oz (72.031 kg)  08/16/15 159 lb (72.122 kg)    Physical Exam  Constitutional: She is well-developed, well-nourished, and in no distress.  HENT:  Head: Normocephalic and atraumatic.  Right Ear: External ear normal.  Left Ear: External ear normal.  Mouth/Throat: Oropharynx is clear and moist. No oropharyngeal exudate.  Left maxillary sinus mildly tender to percussion, TMs with decreased light reflex, though no purulent fluid behind  the TMs  Eyes: Conjunctivae are normal. Pupils are equal, round, and reactive to light.  Neck: Neck supple.  Cardiovascular: Normal rate, regular rhythm and normal heart sounds.  Exam reveals no gallop and no friction rub.   No murmur heard. Pulmonary/Chest: Effort normal and breath sounds normal. No respiratory distress. She has no wheezes. She has no rales.  Lymphadenopathy:    She has no cervical adenopathy.  Neurological: She is alert.  CN 2-12 intact, 5/5 strength in bilateral biceps, triceps, grip, quads, hamstrings, plantar and dorsiflexion, sensation to light touch intact in bilateral UE and LE, normal gait, 2+ patellar reflexes, minimal imbalance on Romberg though was able to maintain her balance, no pronator drift, positive Dix-Hallpike on the left  Skin: Skin is warm and dry. She is not diaphoretic.     Assessment/Plan: Please see individual problem list.  Sinusitis, acute maxillary Sinus pressure and congestion with ear congestion and vertigo likely consistent with sinusitis. Suspect given duration and lack of improvement this is bacterial in nature. Patient's vertigo was reproduced on Dix-Hallpike maneuver and she did have horizontal nystagmus on the left making one wonder if BPPV could be playing a role in this. She had minimal imbalance on Romberg though is able to maintain her position. Doubt central nervous system lesion. We'll treat sinus infection with doxycycline given Augmentin allergy. She will take a probiotic while on this. I provided her with Epley maneuvers to do at home for her vertigo. She will continue Zyrtec and Flonase. She'll continue  meclizine as needed. If vertigo does not improve with treatment of sinus infection and persists despite improvement in other symptoms would consider further workup of vertigo. She's given return precautions.    No orders of the defined types were placed in this encounter.    Meds ordered this encounter  Medications  .  doxycycline (VIBRA-TABS) 100 MG tablet    Sig: Take 1 tablet (100 mg total) by mouth 2 (two) times daily.    Dispense:  14 tablet    Refill:  0    Marikay AlarEric Makyla Bye, MD [facility]

## 2015-10-07 NOTE — Progress Notes (Signed)
Pre visit review using our clinic review tool, if applicable. No additional management support is needed unless otherwise documented below in the visit note. 

## 2015-10-07 NOTE — Patient Instructions (Addendum)
Nice to meet you. Your vertigo is likely related to a in her ear issue. Could be related to a sinus infection. We will treat her for a sinus infection with doxycycline. Please continue the Zyrtec, Flonase, and meclizine. Please do the provided exercises as well. If you develop persistent dizziness, numbness, weakness, fevers, or any new or changing symptoms please seek medical attention.

## 2015-10-18 ENCOUNTER — Telehealth: Payer: Self-pay | Admitting: Internal Medicine

## 2015-10-18 NOTE — Telephone Encounter (Signed)
Cologuard negative in yellow folder.

## 2015-11-17 ENCOUNTER — Ambulatory Visit
Admission: RE | Admit: 2015-11-17 | Discharge: 2015-11-17 | Disposition: A | Discharge status: Home / Self Care | Payer: PPO | Source: Ambulatory Visit | Attending: Internal Medicine | Admitting: Internal Medicine

## 2015-11-17 ENCOUNTER — Other Ambulatory Visit: Payer: Self-pay | Admitting: Internal Medicine

## 2015-11-17 DIAGNOSIS — Z1231 Encounter for screening mammogram for malignant neoplasm of breast: Secondary | ICD-10-CM | POA: Diagnosis not present

## 2015-11-17 DIAGNOSIS — Z1239 Encounter for other screening for malignant neoplasm of breast: Secondary | ICD-10-CM

## 2015-11-19 ENCOUNTER — Encounter: Payer: Self-pay | Admitting: Internal Medicine

## 2015-11-28 ENCOUNTER — Ambulatory Visit (INDEPENDENT_AMBULATORY_CARE_PROVIDER_SITE_OTHER): Payer: PPO

## 2015-11-28 VITALS — BP 130/70 | HR 62 | Temp 97.4°F | Resp 14 | Ht 61.0 in | Wt 162.8 lb

## 2015-11-28 DIAGNOSIS — Z0000 Encounter for general adult medical examination without abnormal findings: Secondary | ICD-10-CM | POA: Diagnosis not present

## 2015-11-28 NOTE — Progress Notes (Signed)
  I have reviewed the above information and agree with above.   Zaylah Blecha, MD 

## 2015-11-28 NOTE — Patient Instructions (Signed)
  Ms. Mary Vasquez , Thank you for taking time to come for your Medicare Wellness Visit. I appreciate your ongoing commitment to your health goals. Please review the following plan we discussed and let me know if I can assist you in the future.   Follow up with Dr. Darrick Huntsmanullo as needed.   This is a list of the screening recommended for you and due dates:  Health Maintenance  Topic Date Due  . Shingles Vaccine  10/16/2001  . Colon Cancer Screening  02/20/2015  . Flu Shot  02/07/2016  . Mammogram  11/16/2016  . Tetanus Vaccine  07/10/2019  . DEXA scan (bone density measurement)  Completed  . Pneumonia vaccines  Completed

## 2015-11-28 NOTE — Progress Notes (Signed)
Subjective:   Mary Vasquez is a 75 y.o. female who presents for Medicare Annual (Subsequent) preventive examination.  Review of Systems:  No ROS.  Medicare Wellness Visit.  Cardiac Risk Factors include: advanced age (>3men, >58 women);hypertension     Objective:     Vitals: BP 130/70 mmHg  Pulse 62  Temp(Src) 97.4 F (36.3 C) (Oral)  Resp 14  Ht  (1.549 m)  Wt 162 lb 12.8 oz (73.846 kg)  BMI 30.78 kg/m2  SpO2 98%  Body mass index is 30.78 kg/(m^2).   Tobacco History  Smoking status  . Never Smoker   Smokeless tobacco  . Never Used     Counseling given: Not Answered   Past Medical History  Diagnosis Date  . Hyperlipidemia   . Hypertension    Past Surgical History  Procedure Laterality Date  . Hysteroscopy  2008  . Rotator cuff repair  2009    [facility]  . Breast surgery  2009    stereotactic,  left,  benign   . Breast biopsy Left 2009    CORE - NEG   Family History  Problem Relation Age of Onset  . Heart disease Mother 61    ami  . Heart disease Father     ami  . Diabetes Brother   . Cancer Brother     prostate ca  . Breast cancer Paternal Aunt     50'S  . Breast cancer Cousin    History  Sexual Activity  . Sexual Activity: Not Currently    Outpatient Encounter Prescriptions as of 11/28/2015  Medication Sig  . cetirizine (ZYRTEC) 10 MG tablet Take 10 mg by mouth daily.  . Cholecalciferol (VITAMIN D-3) 1000 UNITS CAPS Take by mouth 2 (two) times daily.  . [DISCONTINUED] calcium carbonate (OS-CAL) 600 MG TABS Take 600 mg by mouth 2 (two) times daily with a meal.  . [DISCONTINUED] doxycycline (VIBRA-TABS) 100 MG tablet Take 1 tablet (100 mg total) by mouth 2 (two) times daily.  . [DISCONTINUED] Multiple Vitamin (MULTIVITAMIN) tablet Take 1 tablet by mouth daily.  . [DISCONTINUED] triamcinolone cream (KENALOG) 0.1 % Apply 1 application topically 2 (two) times daily. Until rash has resolved  . atorvastatin (LIPITOR) 40 MG tablet Take 1  tablet (40 mg total) by mouth daily. (Patient not taking: Reported on 11/28/2015)  . meclizine (ANTIVERT) 25 MG tablet Take 1 tablet (25 mg total) by mouth 3 (three) times daily as needed for dizziness. (Patient not taking: Reported on 11/28/2015)  . zoster vaccine live, PF, (ZOSTAVAX) 16109 UNT/0.65ML injection Inject 19,400 Units into the skin once. (Patient not taking: Reported on 11/28/2015)   No facility-administered encounter medications on file as of 11/28/2015.    Activities of Daily Living In your present state of health, do you have any difficulty performing the following activities: 11/28/2015  Hearing? N  Vision? N  Difficulty concentrating or making decisions? N  Walking or climbing stairs? N  Dressing or bathing? N  Doing errands, shopping? N  Preparing Food and eating ? N  Using the Toilet? N  In the past six months, have you accidently leaked urine? N  Do you have problems with loss of bowel control? N  Managing your Medications? N  Managing your Finances? N  Housekeeping or managing your Housekeeping? N    Patient Care Team: Sherlene Shams, MD as PCP - General (Internal Medicine)    Assessment:   This is a routine wellness examination for Mary Vasquez. The  goal of the wellness visit is to assist the patient how to close the gaps in care and create a preventative care plan for the patient.   Taking VIT D3 as appropriate/Osteoporosis risk reviewed..   Medications reviewed; taking without issues or barriers.  Safety issues reviewed; smoke detectors in the home. No firearms in the home. Wears seatbelts when driving or riding with others. No violence in the home.  No identified risk were noted; The patient was oriented x 3; appropriate in dress and manner and no objective failures at ADL's or IADL's.   Pre-obese; discussed the benefit of a healthy diet combined with exercise.  ZOSTAVAX vaccine postponed, per patient request.  Patient Concerns:  None at this time.   Follow up with PCP as needed.  Exercise Activities and Dietary recommendations Current Exercise Habits: The patient does not participate in regular exercise at present  Goals    . Healthy Lifestyle     Stay hydrated and continue to drink plenty of fluids.  Great job on your water intake! Low carb diet.  Lean meats and vegetables.  Educational material provided. Stay active and [facility] for 30 minute session, increase as tolerated to 2 days, then 3 days and as tolerated.      Fall Risk Fall Risk  11/28/2015 04/23/2013  Falls in the past year? No No   Depression Screen PHQ 2/9 Scores 11/28/2015 04/23/2013  PHQ - 2 Score 0 0     Cognitive Testing MMSE - Mini Mental State Exam 11/28/2015  Orientation to time 5  Orientation to Place 5  Registration 3  Attention/ Calculation 5  Recall 3  Language- name 2 objects 2  Language- repeat 1  Language- follow 3 step command 3  Language- read & follow direction 1  Write a sentence 1  Copy design 1  Total score 30    Immunization History  Administered Date(s) Administered  . Influenza Nasal 04/11/2015  . Influenza-Unspecified 04/20/2013, 04/16/2014  . Pneumococcal Conjugate-13 04/23/2013  . Pneumococcal Polysaccharide-23 04/23/2006, 06/14/2014  . Tdap 07/09/2009   Screening Tests Health Maintenance  Topic Date Due  . ZOSTAVAX  11/28/2016 (Originally 10/16/2001)  . INFLUENZA VACCINE  02/07/2016  . MAMMOGRAM  11/16/2016  . TETANUS/TDAP  07/10/2019  . COLONOSCOPY  09/29/2025  . DEXA SCAN  Completed  . PNA vac Low Risk Adult  Completed      Plan:   End of life planning; Advance aging; Advanced directives discussed. No HCPOA/Living Will.  Educational material provided to help her start the conversation with her family.  Copy of short forms requested upon completion.  Time spent discussing HCPOA/Living Will is 20 minutes.    During the course of the visit the patient was educated and  counseled about the following appropriate screening and preventive services:   Vaccines to include Pneumoccal, Influenza, Hepatitis B, Td, Zostavax, HCV  Electrocardiogram  Cardiovascular Disease  Colorectal cancer screening  Bone density screening  Diabetes screening  Glaucoma screening  Mammography/PAP  Nutrition counseling   Patient Instructions (the written plan) was given to the patient.   Ashok PallOBrien-Blaney, Ayslin Kundert L, LPN  [id number]/52/84135/22/2017

## 2015-11-29 NOTE — Progress Notes (Signed)
  I have reviewed the above information and agree with above.   Lacrystal Barbe, MD 

## 2015-12-21 NOTE — Telephone Encounter (Signed)
Mailed unread message to patient, thanks 

## 2016-05-28 ENCOUNTER — Ambulatory Visit (INDEPENDENT_AMBULATORY_CARE_PROVIDER_SITE_OTHER): Payer: PPO

## 2016-05-28 DIAGNOSIS — Z23 Encounter for immunization: Secondary | ICD-10-CM

## 2016-08-27 ENCOUNTER — Ambulatory Visit (INDEPENDENT_AMBULATORY_CARE_PROVIDER_SITE_OTHER): Payer: PPO | Admitting: Internal Medicine

## 2016-08-27 ENCOUNTER — Ambulatory Visit (INDEPENDENT_AMBULATORY_CARE_PROVIDER_SITE_OTHER): Payer: PPO

## 2016-08-27 ENCOUNTER — Encounter: Payer: Self-pay | Admitting: Internal Medicine

## 2016-08-27 VITALS — BP 150/88 | HR 68 | Resp 16 | Wt 163.0 lb

## 2016-08-27 DIAGNOSIS — G8929 Other chronic pain: Secondary | ICD-10-CM

## 2016-08-27 DIAGNOSIS — E785 Hyperlipidemia, unspecified: Secondary | ICD-10-CM

## 2016-08-27 DIAGNOSIS — M1611 Unilateral primary osteoarthritis, right hip: Secondary | ICD-10-CM | POA: Diagnosis not present

## 2016-08-27 DIAGNOSIS — M25551 Pain in right hip: Secondary | ICD-10-CM | POA: Diagnosis not present

## 2016-08-27 DIAGNOSIS — M48061 Spinal stenosis, lumbar region without neurogenic claudication: Secondary | ICD-10-CM | POA: Diagnosis not present

## 2016-08-27 DIAGNOSIS — I10 Essential (primary) hypertension: Secondary | ICD-10-CM

## 2016-08-27 NOTE — Progress Notes (Signed)
Subjective:  Patient ID: Mary Vasquez, female    DOB: September 30, 1940  Age: 76 y.o. MRN: [id number]  CC: The primary encounter diagnosis was Chronic hip pain, right. Diagnoses of Essential hypertension and Hyperlipidemia with target LDL less than 100 were also pertinent to this visit.  HPI [name] presents for right hip pain .  Last seen march 2017  Her hip pain has been present for months,  First noticed it April 2017  . Has Sharp pain in the upper thigh/groin if she moves the leg a certain way .  Very occasional . Now mostly has a dull ache and stiffness,  Will occasionally have dull lateral hip  pain that will radiate to knee  But not below .  Entire leg will ache if she stands for more than 15 minutes , will ache and feel somewhat numb. ,  No history  Of falls .  Has been using tylenol ES 1-2 daily   Denies back pain but does note morning  stiffness.  Feels she has a leg length discrepancy   Has not been able to walk for more than 15 minutes . Has not tried water aerobics or bicycling.  Does not belong to a gym.  .    Outpatient Medications Prior to Visit  Medication Sig Dispense Refill  . atorvastatin (LIPITOR) 40 MG tablet Take 1 tablet (40 mg total) by mouth daily. 90 tablet 3  . cetirizine (ZYRTEC) 10 MG tablet Take 10 mg by mouth daily.    . Cholecalciferol (VITAMIN D-3) 1000 UNITS CAPS Take by mouth 2 (two) times daily.    . meclizine (ANTIVERT) 25 MG tablet Take 1 tablet (25 mg total) by mouth 3 (three) times daily as needed for dizziness. 30 tablet 0  . zoster vaccine live, PF, (ZOSTAVAX) 11914 UNT/0.65ML injection Inject 19,400 Units into the skin once. 1 each 0   No facility-administered medications prior to visit.     Review of Systems;  Patient denies headache, fevers, malaise, unintentional weight loss, skin rash, eye pain, sinus congestion and sinus pain, sore throat, dysphagia,  hemoptysis , cough, dyspnea, wheezing, chest pain, palpitations, orthopnea, edema,  abdominal pain, nausea, melena, diarrhea, constipation, flank pain, dysuria, hematuria, urinary  Frequency, nocturia, numbness, tingling, seizures,  Focal weakness, Loss of consciousness,  Tremor, insomnia, depression, anxiety, and suicidal ideation.      Objective:  BP (!) 150/88   Pulse 68   Resp 16   Wt 163 lb (73.9 kg)   SpO2 97%   BMI 30.80 kg/m   BP Readings from Last 3 Encounters:  08/27/16 (!) 150/88  11/28/15 130/70  10/07/15 132/86    Wt Readings from Last 3 Encounters:  08/27/16 163 lb (73.9 kg)  11/28/15 162 lb 12.8 oz (73.8 kg)  10/07/15 163 lb 6.4 oz (74.1 kg)    General appearance: alert, cooperative and appears stated age Back: symmetric, no curvature. ROM normal. No CVA or spinal  tenderness. Lungs: clear to auscultation bilaterally Heart: regular rate and rhythm, S1, S2 normal, no murmur, click, rub or gallop Abdomen: soft, non-tender; bowel sounds normal; no masses,  no organomegaly Pulses: 2+ and symmetric Skin: Skin color, texture, turgor normal. No rashes or lesions Lymph nodes: Cervical, supraclavicular, and axillary nodes normal. Ext: right hip ROM limited by pain w/r/t abduction and external rotation.  Straight leg lift negative.   Lab Results  Component Value Date   HGBA1C 5.8 09/09/2015    Lab Results  Component Value Date  CREATININE 1.16 09/09/2015   CREATININE 0.98 08/16/2015   CREATININE 0.9 06/14/2014    Lab Results  Component Value Date   WBC 5.2 08/16/2015   HGB 13.0 08/16/2015   HCT 37.6 08/16/2015   PLT 214 08/16/2015   GLUCOSE 112 (H) 09/09/2015   CHOL 196 09/09/2015   TRIG 114.0 09/09/2015   HDL 77.30 09/09/2015   LDLDIRECT 91.0 09/09/2015   LDLCALC 96 09/09/2015   ALT 17 09/09/2015   AST 27 09/09/2015   NA 140 09/09/2015   K 4.2 09/09/2015   CL 103 09/09/2015   CREATININE 1.16 09/09/2015   BUN 12 09/09/2015   CO2 27 09/09/2015   TSH 2.02 09/09/2015   HGBA1C 5.8 09/09/2015    Mm Screening Breast Tomo  Bilateral  Result Date: 11/18/2015 CLINICAL DATA:  Screening. EXAM: 2D DIGITAL SCREENING BILATERAL MAMMOGRAM WITH CAD AND ADJUNCT TOMO COMPARISON:  Previous exam(s). ACR Breast Density Category c: The breast tissue is heterogeneously dense, which may obscure small masses. FINDINGS: There are no findings suspicious for malignancy. Images were processed with CAD. IMPRESSION: No mammographic evidence of malignancy. A result letter of this screening mammogram will be mailed directly to the patient. RECOMMENDATION: Screening mammogram in one year. (Code:SM-B-01Y) BI-RADS CATEGORY  1: Negative. Electronically Signed   By: Edwin CapJennifer  Jarosz M.D.   On: 11/18/2015 08:24    Assessment & Plan:   Problem List Items Addressed This Visit    Chronic hip pain, right - Primary    Plain films of lumbar spine and right hip point to the hip joint as the source of her pain.  Marked degenerative change with subchondral cyst formation and spurring, with only mild degenerative changes of the lumbar spine .  She defers analgesics ,  Has been advised to use motrin/tylenol for now.  Orthopedics referral when ready       Relevant Orders   DG HIP UNILAT WITH PELVIS 2-3 VIEWS RIGHT (Completed)   DG Lumbar Spine Complete (Completed)   CBC with Differential/Platelet   Hyperlipidemia with target LDL less than 100   Relevant Orders   TSH   Lipid panel   Hypertension   Relevant Orders   Comprehensive metabolic panel      I am having Ms. Fye maintain her Vitamin D-3, cetirizine, zoster vaccine live (PF), atorvastatin, and meclizine.  No orders of the defined types were placed in this encounter.   There are no discontinued medications.  Follow-up: No Follow-up on file.   Sherlene ShamsULLO, Michol Emory L, MD

## 2016-08-27 NOTE — Progress Notes (Signed)
Pre visit review using our clinic review tool, if applicable. No additional management support is needed unless otherwise documented below in the visit note. 

## 2016-08-27 NOTE — Patient Instructions (Signed)
I have ordered plain films of right hip and lumbar spine  If the hip joint shows bone spurring and loss of joint space,  You have "degenerative joint disease" and may benefit from  Osteo biflex (glucosamine) Tumeric (natural anti inflammatory) OR motrin

## 2016-08-28 ENCOUNTER — Encounter: Payer: Self-pay | Admitting: Internal Medicine

## 2016-08-28 DIAGNOSIS — M25551 Pain in right hip: Principal | ICD-10-CM

## 2016-08-28 DIAGNOSIS — G8929 Other chronic pain: Secondary | ICD-10-CM | POA: Insufficient documentation

## 2016-08-28 NOTE — Assessment & Plan Note (Addendum)
Plain films of lumbar spine and right hip point to the hip joint as the source of her pain.  Marked degenerative change with subchondral cyst formation and spurring, with only mild degenerative changes of the lumbar spine .  She defers analgesics ,  Has been advised to use motrin/tylenol for now.  Orthopedics referral when ready

## 2016-08-30 ENCOUNTER — Telehealth: Payer: Self-pay | Admitting: *Deleted

## 2016-08-30 NOTE — Telephone Encounter (Signed)
Pt informed of below.    08/28/16 9:45 AM   Your lumbar spin films show mild degenerative changes, But your hip films show "Marked" degenerative changes of the hip joint , so the hip joint is the culprit! I recommend seeing an orthopedist when you get tired of being in pain.   Regards,    Duncan Dulleresa Tullo, MD

## 2016-08-30 NOTE — Telephone Encounter (Signed)
PLEAS SEE  MYCHART MESSAGE SENT ON FEB 20

## 2016-08-30 NOTE — Telephone Encounter (Signed)
Pt requested  Xray results Pt contact [phone]

## 2016-10-29 ENCOUNTER — Other Ambulatory Visit: Payer: Self-pay | Admitting: Internal Medicine

## 2016-11-27 ENCOUNTER — Ambulatory Visit: Payer: PPO

## 2016-11-28 ENCOUNTER — Other Ambulatory Visit: Payer: Self-pay | Admitting: Internal Medicine

## 2016-11-28 DIAGNOSIS — Z1231 Encounter for screening mammogram for malignant neoplasm of breast: Secondary | ICD-10-CM

## 2016-12-06 ENCOUNTER — Ambulatory Visit (INDEPENDENT_AMBULATORY_CARE_PROVIDER_SITE_OTHER): Payer: PPO | Admitting: Internal Medicine

## 2016-12-06 ENCOUNTER — Encounter: Payer: Self-pay | Admitting: Internal Medicine

## 2016-12-06 VITALS — BP 138/78 | HR 70 | Temp 98.1°F | Resp 14 | Ht 61.0 in | Wt 160.6 lb

## 2016-12-06 DIAGNOSIS — E559 Vitamin D deficiency, unspecified: Secondary | ICD-10-CM

## 2016-12-06 DIAGNOSIS — R5383 Other fatigue: Secondary | ICD-10-CM

## 2016-12-06 DIAGNOSIS — M25551 Pain in right hip: Secondary | ICD-10-CM

## 2016-12-06 DIAGNOSIS — G8929 Other chronic pain: Secondary | ICD-10-CM | POA: Diagnosis not present

## 2016-12-06 DIAGNOSIS — F4321 Adjustment disorder with depressed mood: Secondary | ICD-10-CM

## 2016-12-06 DIAGNOSIS — Z0000 Encounter for general adult medical examination without abnormal findings: Secondary | ICD-10-CM

## 2016-12-06 DIAGNOSIS — E785 Hyperlipidemia, unspecified: Secondary | ICD-10-CM

## 2016-12-06 DIAGNOSIS — I10 Essential (primary) hypertension: Secondary | ICD-10-CM | POA: Diagnosis not present

## 2016-12-06 DIAGNOSIS — Z0001 Encounter for general adult medical examination with abnormal findings: Secondary | ICD-10-CM

## 2016-12-06 LAB — CBC WITH DIFFERENTIAL/PLATELET
BASOS PCT: 1.7 % (ref 0.0–3.0)
Basophils Absolute: 0.1 10*3/uL (ref 0.0–0.1)
EOS ABS: 0.1 10*3/uL (ref 0.0–0.7)
Eosinophils Relative: 2.4 % (ref 0.0–5.0)
HCT: 42 % (ref 36.0–46.0)
HEMOGLOBIN: 14 g/dL (ref 12.0–15.0)
Lymphocytes Relative: 37.5 % (ref 12.0–46.0)
Lymphs Abs: 1.7 10*3/uL (ref 0.7–4.0)
MCHC: 33.3 g/dL (ref 30.0–36.0)
MCV: 91.1 fl (ref 78.0–100.0)
MONO ABS: 0.4 10*3/uL (ref 0.1–1.0)
Monocytes Relative: 7.7 % (ref 3.0–12.0)
NEUTROS PCT: 50.7 % (ref 43.0–77.0)
Neutro Abs: 2.3 10*3/uL (ref 1.4–7.7)
PLATELETS: 240 10*3/uL (ref 150.0–400.0)
RBC: 4.61 Mil/uL (ref 3.87–5.11)
RDW: 14.7 % (ref 11.5–15.5)
WBC: 4.6 10*3/uL (ref 4.0–10.5)

## 2016-12-06 LAB — TSH: TSH: 8.41 u[IU]/mL — ABNORMAL HIGH (ref 0.35–4.50)

## 2016-12-06 LAB — LIPID PANEL
Cholesterol: 258 mg/dL — ABNORMAL HIGH (ref 0–200)
HDL: 58.3 mg/dL (ref >39.00)
LDL CALC: 162 mg/dL — AB (ref 0–99)
NonHDL: 199.2
Total CHOL/HDL Ratio: 4
Triglycerides: 188 mg/dL — ABNORMAL HIGH (ref 0.0–149.0)
VLDL: 37.6 mg/dL (ref 0.0–40.0)

## 2016-12-06 LAB — COMPREHENSIVE METABOLIC PANEL
ALT: 17 U/L (ref 0–35)
AST: 29 U/L (ref 0–37)
Albumin: 4.4 g/dL (ref 3.5–5.2)
Alkaline Phosphatase: 61 U/L (ref 39–117)
BUN: 15 mg/dL (ref 6–23)
CO2: 29 meq/L (ref 19–32)
CREATININE: 1.17 mg/dL (ref 0.40–1.20)
Calcium: 9.8 mg/dL (ref 8.4–10.5)
Chloride: 104 mEq/L (ref 96–112)
GFR: 47.91 mL/min — ABNORMAL LOW (ref >60.00)
Glucose, Bld: 109 mg/dL — ABNORMAL HIGH (ref 70–99)
Potassium: 4.3 mEq/L (ref 3.5–5.1)
SODIUM: 139 meq/L (ref 135–145)
Total Bilirubin: 0.7 mg/dL (ref 0.2–1.2)
Total Protein: 7.6 g/dL (ref 6.0–8.3)

## 2016-12-06 LAB — VITAMIN B12: Vitamin B-12: 316 pg/mL (ref 211–911)

## 2016-12-06 LAB — VITAMIN D 25 HYDROXY (VIT D DEFICIENCY, FRACTURES): VITD: 40.02 ng/mL (ref 30.00–100.00)

## 2016-12-06 MED ORDER — CELECOXIB 200 MG PO CAPS: 200.0000 mg | ORAL_CAPSULE | Freq: Two times a day (BID) | ORAL | 3 refills | Status: DC

## 2016-12-06 NOTE — Progress Notes (Signed)
Patient ID: Mary Vasquez, female    DOB: 1941/05/31  Age: 76 y.o. MRN: [id number]  The patient is here for  Follow up  On chronic and acute problems, including hyperlipidemia,  Prediabetes   colonoscopy  Done March 2017 Mammogram may 2017 scheduled  The risk factors are reflected in the social history.  The roster of all physicians providing medical care to patient - is listed in the Snapshot section of the chart.  Activities of daily living:  The patient is 100% independent in all ADLs: dressing, toileting, feeding as well as independent mobility  Home safety : The patient has smoke detectors in the home. They wear seatbelts.  There are no firearms at home. There is no violence in the home.   There is no risks for hepatitis, STDs or HIV. There is no   history of blood transfusion. They have no travel history to infectious disease endemic areas of the world.  The patient has seen their dentist in the last six month. They have seen their eye doctor in the last year. They admit to slight hearing difficulty with regard to whispered voices and some television programs.  They have deferred audiologic testing in the last year.  They do not  have excessive sun exposure. Discussed the need for sun protection: hats, long sleeves and use of sunscreen if there is significant sun exposure.   Diet: the importance of a healthy diet is discussed. They do have a healthy diet.  The benefits of regular aerobic exercise were discussed. She walks 4 times per week ,  20 minutes.   Depression screen: there are no signs or vegative symptoms of depression- irritability, change in appetite, anhedonia, sadness/tearfullness.  Cognitive assessment: the patient manages all their financial and personal affairs and is actively engaged. They could relate day,date,year and events; recalled 2/3 objects at 3 minutes; performed clock-face test normally.  The following portions of the patient's history were reviewed and  updated as appropriate: allergies, current medications, past family history, past medical history,  past surgical history, past social history  and problem list.  Visual acuity was not assessed per patient preference since she has regular follow up with her ophthalmologist. Hearing and body mass index were assessed and reviewed.   During the course of the visit the patient was educated and counseled about appropriate screening and preventive services including : fall prevention , diabetes screening, nutrition counseling, colorectal cancer screening, and recommended immunizations.    CC: The primary encounter diagnosis was Vitamin D deficiency. Diagnoses of Fatigue, unspecified type, Essential hypertension, Hyperlipidemia with target LDL less than 100, Encounter for preventive health examination, Chronic hip pain, right, and Adjustment disorder with depressed mood were also pertinent to this visit. Mood disorder: suffering from grief,  Loneliness, lack of purpose. Her last remaining sister died in 2022/07/27.  Thought she was managing her grief well until the [facility] remembrance memorial last week, the service  was emotionally devastating for her. 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    Feels very restless,  Lonely, also finally retired a year ago and having  trouble figuring out her purpose without anyone to care for.     Also feels that the continued poor weather as well as her chronic riht hip pain are both contributors as well o her mood disorder..   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 Can tolerate standing but has  not tried walking in weeks .     History Mary Vasquez has a past medical history of Hyperlipidemia and Hypertension.   She has a past surgical history that includes Hysteroscopy (2008); Rotator cuff repair (2009); Breast surgery (2009);  and Breast biopsy (Left, 2009).   Her family history includes Breast cancer in her cousin and paternal aunt; Cancer in her brother; Diabetes in her brother; Heart disease in her father; Heart disease (age of onset: 24) in her mother.She reports that she has never smoked. She has never used smokeless tobacco. She reports that she drinks about 4.2 oz of alcohol per week . She reports that she does not use drugs.  Outpatient Medications Prior to Visit  Medication Sig Dispense Refill  . cetirizine (ZYRTEC) 10 MG tablet Take 10 mg by mouth daily.    . meclizine (ANTIVERT) 25 MG tablet Take 1 tablet (25 mg total) by mouth 3 (three) times daily as needed for dizziness. 30 tablet 0  . atorvastatin (LIPITOR) 40 MG tablet Take 1 tablet (40 mg total) by mouth daily. (Patient not taking: Reported on 12/06/2016) 90 tablet 3  . Cholecalciferol (VITAMIN D-3) 1000 UNITS CAPS Take by mouth 2 (two) times daily.    Marland Kitchen zoster vaccine live, PF, (ZOSTAVAX) 82993 UNT/0.65ML injection Inject 19,400 Units into the skin once. (Patient not taking: Reported on 12/06/2016) 1 each 0   No facility-administered medications prior to visit.     Review of Systems  Patient denies headache, fevers, malaise, unintentional weight loss, skin rash, eye pain, sinus congestion and sinus pain, sore throat, dysphagia,  hemoptysis , cough, dyspnea, wheezing, chest pain, palpitations, orthopnea, edema, abdominal pain, nausea, melena, diarrhea, constipation, flank pain, dysuria, hematuria, urinary  Frequency, nocturia, numbness, tingling, seizures,  Focal weakness, Loss of consciousness,  Tremor, insomnia, , anxiety, and suicidal ideation.      Objective:  BP 138/78 (BP Location: Left Arm, Patient Position: Sitting, Cuff Size: Normal)   Pulse 70   Temp 98.1 F (36.7 C) (Oral)   Resp 14   Ht '5\' 1"'$  (1.549 m)   Wt 160 lb 9.6 oz (72.8 kg)   SpO2 96%   BMI 30.35 kg/m   Physical Exam   General appearance: alert, cooperative and appears  stated age Head: Normocephalic, without obvious abnormality, atraumatic Eyes: conjunctivae/corneas clear. PERRL, EOM's intact. Fundi benign. Ears: normal TM's and external ear canals both ears Nose: Nares normal. Septum midline. Mucosa normal. No drainage or sinus tenderness. Throat: lips, mucosa, and tongue normal; teeth and gums normal Neck: no adenopathy, no carotid bruit, no JVD, supple, symmetrical, trachea midline and thyroid not enlarged, symmetric, no tenderness/mass/nodules Lungs: clear to auscultation bilaterally Breasts: normal appearance, no masses or tenderness Heart: regular rate and rhythm, S1, S2 normal, no murmur, click, rub or gallop Abdomen: soft, non-tender; bowel sounds normal; no masses,  no organomegaly Extremities: extremities normal, atraumatic, no cyanosis or edema Pulses: 2+ and symmetric Skin: Skin color, texture, turgor normal. No rashes or lesions Neurologic: Alert and oriented X 3, normal strength and tone. Normal symmetric reflexes. Normal coordination and gait.      Assessment & Plan:   Problem List Items Addressed This Visit    Hypertension    Well controlled on current regimen. Renal function stable, no changes today.  Lab Results  Component Value Date   CREATININE 1.17 12/06/2016   Lab Results  Component Value Date   NA 139 12/06/2016  K 4.3 12/06/2016   CL 104 12/06/2016   CO2 29 12/06/2016         Relevant Orders   Comp Met (CMET) (Completed)   Hyperlipidemia with target LDL less than 100   Relevant Orders   Lipid panel (Completed)   Microalbumin / creatinine urine ratio   Encounter for preventive health examination    Annual comprehensive preventive exam was done as well as an evaluation and management of chronic conditions .  During the course of the visit the patient was educated and counseled about appropriate screening and preventive services including :  diabetes screening, lipid analysis with projected  10 year  risk for CAD ,  nutrition counseling, breast, cervical and colorectal cancer screening, and recommended immunizations.  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   Marked degenerative change with subchondral cyst formation and spurring, with only mild degenerative changes of the lumbar spine .  She defers analgesics ,  Has been advised to use motrin/tylenol for now.  Orthopedics referral when ready. celebrex prescried.       Adjustment disorder    Secondary to prolonged grief. Grief counseling advised.        Other Visit Diagnoses    Vitamin D deficiency    -  Primary   Relevant Orders   VITAMIN D 25 Hydroxy (Vit-D Deficiency, Fractures) (Completed)   Fatigue, unspecified type       Relevant Orders   B12 (Completed)   TSH (Completed)   CBC with Differential/Platelet (Completed)    A total of 40 minutes was spent with patient more than half of which was spent in counseling patient on the above mentioned issues , reviewing and explaining recent labs and imaging studies done, and coordination of care.   I have discontinued Ms. Greenwalt's Vitamin D-3, zoster vaccine live (PF), and atorvastatin. I am also having her start on celecoxib. Additionally, I am having her maintain her cetirizine, meclizine, and Misc Natural Products (OSTEO BI-FLEX TRIPLE STRENGTH PO).  Meds ordered this encounter  Medications  . Misc Natural Products (OSTEO BI-FLEX TRIPLE STRENGTH PO)    Sig: Take 2 tablets by mouth daily.  . celecoxib (CELEBREX) 200 MG capsule    Sig: Take 1 capsule (200 mg total) by mouth 2 (two) times daily.    Dispense:  60 capsule    Refill:  3    Medications Discontinued During This Encounter  Medication Reason  . atorvastatin (LIPITOR) 40 MG tablet Patient has not taken in last 30 days  . Cholecalciferol (VITAMIN D-3) 1000 UNITS CAPS Patient has not taken in last 30 days  . zoster vaccine live, PF,  (ZOSTAVAX) 42103 UNT/0.65ML injection Patient has not taken in last 30 days    Follow-up: No Follow-up on file.   Crecencio Mc, MD

## 2016-12-06 NOTE — Patient Instructions (Addendum)
The new goals for optimal blood pressure management are 130/80.  Please check your blood pressure a few times at home over the next 3 months and send me the readings so I can determine if you need to start a medication   I recommend starting celebrex ONCE DAILY with food for your hip pain You can also take up to 2000 mg of tylenol in divided doses.  As an addition to the celebrex  If you want ot see a grief counsellor,  I recommend Trey Paula,  She is part of [facility]'s group in [place] amd I will make a referral  You can try using melatonin if needed for insomnia.  Up to 5 mg daily dose   All other "sleep aids" contain  older generation antihistamines and can cause dry mouth and constipation   \The ShingRx vaccine will be available locally at pharmacies  and IS ADVISED for all interested adults over 50 to prevent shingles     Health Maintenance for Postmenopausal Women Menopause is a normal process in which your reproductive ability comes to an end. This process happens gradually over a span of months to years, usually between the ages of 56 and 35. Menopause is complete when you have missed 12 consecutive menstrual periods. It is important to talk with your health care provider about some of the most common conditions that affect postmenopausal women, such as heart disease, cancer, and bone loss (osteoporosis). Adopting a healthy lifestyle and getting preventive care can help to promote your health and wellness. Those actions can also lower your chances of developing some of these common conditions. What should I know about menopause? During menopause, you may experience a number of symptoms, such as:  Moderate-to-severe hot flashes.  Night sweats.  Decrease in sex drive.  Mood swings.  Headaches.  Tiredness.  Irritability.  Memory problems.  Insomnia.  Choosing to treat or not to treat menopausal changes is an individual decision that you make with your health care  provider. What should I know about hormone replacement therapy and supplements? Hormone therapy products are effective for treating symptoms that are associated with menopause, such as hot flashes and night sweats. Hormone replacement carries certain risks, especially as you become older. If you are thinking about using estrogen or estrogen with progestin treatments, discuss the benefits and risks with your health care provider. What should I know about heart disease and stroke? Heart disease, heart attack, and stroke become more likely as you age. This may be due, in part, to the hormonal changes that your body experiences during menopause. These can affect how your body processes dietary fats, triglycerides, and cholesterol. Heart attack and stroke are both medical emergencies. There are many things that you can do to help prevent heart disease and stroke:  Have your blood pressure checked at least every 1-2 years. High blood pressure causes heart disease and increases the risk of stroke.  If you are 61-66 years old, ask your health care provider if you should take aspirin to prevent a heart attack or a stroke.  Do not use any tobacco products, including cigarettes, chewing tobacco, or electronic cigarettes. If you need help quitting, ask your health care provider.  It is important to eat a healthy diet and maintain a healthy weight. ? Be sure to include plenty of vegetables, fruits, low-fat dairy products, and lean protein. ? Avoid eating foods that are high in solid fats, added sugars, or salt (sodium).  Get regular exercise. This is one  of the most important things that you can do for your health. ? Try to exercise for at least 150 minutes each week. The type of exercise that you do should increase your heart rate and make you sweat. This is known as moderate-intensity exercise. ? Try to do strengthening exercises at least twice each week. Do these in addition to the moderate-intensity  exercise.  Know your numbers.Ask your health care provider to check your cholesterol and your blood glucose. Continue to have your blood tested as directed by your health care provider.  What should I know about cancer screening? There are several types of cancer. Take the following steps to reduce your risk and to catch any cancer development as early as possible. Breast Cancer  Practice breast self-awareness. ? This means understanding how your breasts normally appear and feel. ? It also means doing regular breast self-exams. Let your health care provider know about any changes, no matter how small.  If you are 58 or older, have a clinician do a breast exam (clinical breast exam or CBE) every year. Depending on your age, family history, and medical history, it may be recommended that you also have a yearly breast X-ray (mammogram).  If you have a family history of breast cancer, talk with your health care provider about genetic screening.  If you are at high risk for breast cancer, talk with your health care provider about having an MRI and a mammogram every year.  Breast cancer (BRCA) gene test is recommended for women who have family members with BRCA-related cancers. Results of the assessment will determine the need for genetic counseling and BRCA1 and for BRCA2 testing. BRCA-related cancers include these types: ? Breast. This occurs in males or females. ? Ovarian. ? Tubal. This may also be called fallopian tube cancer. ? Cancer of the abdominal or pelvic lining (peritoneal cancer). ? Prostate. ? Pancreatic.  Cervical, Uterine, and Ovarian Cancer Your health care provider may recommend that you be screened regularly for cancer of the pelvic organs. These include your ovaries, uterus, and vagina. This screening involves a pelvic exam, which includes checking for microscopic changes to the surface of your cervix (Pap test).  For women ages 21-65, health care providers may recommend a  pelvic exam and a Pap test every three years. For women ages 23-65, they may recommend the Pap test and pelvic exam, combined with testing for human papilloma virus (HPV), every five years. Some types of HPV increase your risk of cervical cancer. Testing for HPV may also be done on women of any age who have unclear Pap test results.  Other health care providers may not recommend any screening for nonpregnant women who are considered low risk for pelvic cancer and have no symptoms. Ask your health care provider if a screening pelvic exam is right for you.  If you have had past treatment for cervical cancer or a condition that could lead to cancer, you need Pap tests and screening for cancer for at least 20 years after your treatment. If Pap tests have been discontinued for you, your risk factors (such as having a new sexual partner) need to be reassessed to determine if you should start having screenings again. Some women have medical problems that increase the chance of getting cervical cancer. In these cases, your health care provider may recommend that you have screening and Pap tests more often.  If you have a family history of uterine cancer or ovarian cancer, talk with your health care  provider about genetic screening.  If you have vaginal bleeding after reaching menopause, tell your health care provider.  There are currently no reliable tests available to screen for ovarian cancer.  Lung Cancer Lung cancer screening is recommended for adults 73-102 years old who are at high risk for lung cancer because of a history of smoking. A yearly low-dose CT scan of the lungs is recommended if you:  Currently smoke.  Have a history of at least 30 pack-years of smoking and you currently smoke or have quit within the past 15 years. A pack-year is smoking an average of one pack of cigarettes per day for one year.  Yearly screening should:  Continue until it has been 15 years since you quit.  Stop if  you develop a health problem that would prevent you from having lung cancer treatment.  Colorectal Cancer  This type of cancer can be detected and can often be prevented.  Routine colorectal cancer screening usually begins at age 32 and continues through age 2.  If you have risk factors for colon cancer, your health care provider may recommend that you be screened at an earlier age.  If you have a family history of colorectal cancer, talk with your health care provider about genetic screening.  Your health care provider may also recommend using home test kits to check for hidden blood in your stool.  A small camera at the end of a tube can be used to examine your colon directly (sigmoidoscopy or colonoscopy). This is done to check for the earliest forms of colorectal cancer.  Direct examination of the colon should be repeated every 5-10 years until age 55. However, if early forms of precancerous polyps or small growths are found or if you have a family history or genetic risk for colorectal cancer, you may need to be screened more often.  Skin Cancer  Check your skin from head to toe regularly.  Monitor any moles. Be sure to tell your health care provider: ? About any new moles or changes in moles, especially if there is a change in a mole's shape or color. ? If you have a mole that is larger than the size of a pencil eraser.  If any of your family members has a history of skin cancer, especially at a young age, talk with your health care provider about genetic screening.  Always use sunscreen. Apply sunscreen liberally and repeatedly throughout the day.  Whenever you are outside, protect yourself by wearing long sleeves, pants, a wide-brimmed hat, and sunglasses.  What should I know about osteoporosis? Osteoporosis is a condition in which bone destruction happens more quickly than new bone creation. After menopause, you may be at an increased risk for osteoporosis. To help prevent  osteoporosis or the bone fractures that can happen because of osteoporosis, the following is recommended:  If you are 79-104 years old, get at least 1,000 mg of calcium and at least 600 mg of vitamin D per day.  If you are older than age 58 but younger than age 53, get at least 1,200 mg of calcium and at least 600 mg of vitamin D per day.  If you are older than age 11, get at least 1,200 mg of calcium and at least 800 mg of vitamin D per day.  Smoking and excessive alcohol intake increase the risk of osteoporosis. Eat foods that are rich in calcium and vitamin D, and do weight-bearing exercises several times each week as directed by your  health care provider. What should I know about how menopause affects my mental health? Depression may occur at any age, but it is more common as you become older. Common symptoms of depression include:  Low or sad mood.  Changes in sleep patterns.  Changes in appetite or eating patterns.  Feeling an overall lack of motivation or enjoyment of activities that you previously enjoyed.  Frequent crying spells.  Talk with your health care provider if you think that you are experiencing depression. What should I know about immunizations? It is important that you get and maintain your immunizations. These include:  Tetanus, diphtheria, and pertussis (Tdap) booster vaccine.  Influenza every year before the flu season begins.  Pneumonia vaccine.  Shingles vaccine.  Your health care provider may also recommend other immunizations. This information is not intended to replace advice given to you by your health care provider. Make sure you discuss any questions you have with your health care provider. Document Released: 08/17/2005 Document Revised: 01/13/2016 Document Reviewed: 03/29/2015 Elsevier Interactive Patient Education  2018 [employer].

## 2016-12-08 DIAGNOSIS — F4320 Adjustment disorder, unspecified: Secondary | ICD-10-CM | POA: Insufficient documentation

## 2016-12-08 NOTE — Assessment & Plan Note (Signed)
Secondary to prolonged grief. Grief counseling advised.

## 2016-12-08 NOTE — Assessment & Plan Note (Signed)
Annual comprehensive preventive exam was done as well as an evaluation and management of chronic conditions .  During the course of the visit the patient was educated and counseled about appropriate screening and preventive services including :  diabetes screening, lipid analysis with projected  10 year  risk for CAD , nutrition counseling, breast, cervical and colorectal cancer screening, and recommended immunizations.  Printed recommendations for health maintenance screenings was given 

## 2016-12-08 NOTE — Assessment & Plan Note (Signed)
Well controlled on current regimen. Renal function stable, no changes today.  Lab Results  Component Value Date   CREATININE 1.17 12/06/2016   Lab Results  Component Value Date   NA 139 12/06/2016   K 4.3 12/06/2016   CL 104 12/06/2016   CO2 29 12/06/2016

## 2016-12-08 NOTE — Assessment & Plan Note (Signed)
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   Marked degenerative change with subchondral cyst formation and spurring, with only mild degenerative changes of the lumbar spine .  She defers analgesics ,  Has been advised to use motrin/tylenol for now.  Orthopedics referral when ready. celebrex prescried.

## 2016-12-09 ENCOUNTER — Encounter: Payer: Self-pay | Admitting: Internal Medicine

## 2016-12-09 ENCOUNTER — Other Ambulatory Visit: Payer: Self-pay | Admitting: Internal Medicine

## 2016-12-09 DIAGNOSIS — E039 Hypothyroidism, unspecified: Secondary | ICD-10-CM

## 2016-12-09 DIAGNOSIS — E7800 Pure hypercholesterolemia, unspecified: Secondary | ICD-10-CM

## 2016-12-09 DIAGNOSIS — R7301 Impaired fasting glucose: Secondary | ICD-10-CM

## 2016-12-09 MED ORDER — LEVOTHYROXINE SODIUM 25 MCG PO TABS: 25.0000 ug | ORAL_TABLET | Freq: Every day | ORAL | 0 refills | Status: DC

## 2016-12-09 NOTE — Progress Notes (Signed)
Levothyroxine

## 2016-12-13 ENCOUNTER — Ambulatory Visit
Admission: RE | Admit: 2016-12-13 | Discharge: 2016-12-13 | Disposition: A | Discharge status: Home / Self Care | Payer: PPO | Source: Ambulatory Visit | Attending: Internal Medicine | Admitting: Internal Medicine

## 2016-12-13 DIAGNOSIS — Z1231 Encounter for screening mammogram for malignant neoplasm of breast: Secondary | ICD-10-CM

## 2016-12-24 NOTE — Telephone Encounter (Signed)
Mailed unread message to patient.  

## 2017-01-12 ENCOUNTER — Telehealth: Payer: Self-pay | Admitting: Internal Medicine

## 2017-01-12 NOTE — Telephone Encounter (Signed)
Left pt message asking to call Allison back directly at [phone] to schedule AWV. Thanks! °

## 2017-01-24 ENCOUNTER — Other Ambulatory Visit (INDEPENDENT_AMBULATORY_CARE_PROVIDER_SITE_OTHER): Payer: PPO

## 2017-01-24 DIAGNOSIS — E7800 Pure hypercholesterolemia, unspecified: Secondary | ICD-10-CM | POA: Diagnosis not present

## 2017-01-24 DIAGNOSIS — R7301 Impaired fasting glucose: Secondary | ICD-10-CM

## 2017-01-24 DIAGNOSIS — E039 Hypothyroidism, unspecified: Secondary | ICD-10-CM | POA: Diagnosis not present

## 2017-01-24 LAB — LIPID PANEL
Cholesterol: 249 mg/dL — ABNORMAL HIGH (ref 0–200)
HDL: 62.2 mg/dL (ref >39.00)
LDL Cholesterol: 155 mg/dL — ABNORMAL HIGH (ref 0–99)
NONHDL: 186.46
TRIGLYCERIDES: 155 mg/dL — AB (ref 0.0–149.0)
Total CHOL/HDL Ratio: 4
VLDL: 31 mg/dL (ref 0.0–40.0)

## 2017-01-24 LAB — TSH: TSH: 5.79 u[IU]/mL — AB (ref 0.35–4.50)

## 2017-01-24 LAB — T4: T4 TOTAL: 7.1 ug/dL (ref 4.5–12.0)

## 2017-01-24 LAB — HEMOGLOBIN A1C: Hgb A1c MFr Bld: 5.8 % (ref 4.6–6.5)

## 2017-01-24 NOTE — Addendum Note (Signed)
Addended by: Penne LashWIGGINS, Domonic Kimball N on: 01/24/2017 08:52 AM   Modules accepted: Orders

## 2017-01-25 LAB — THYROID PEROXIDASE ANTIBODY: Thyroperoxidase Ab SerPl-aCnc: 2 IU/mL (ref <9)

## 2017-01-26 ENCOUNTER — Encounter: Payer: Self-pay | Admitting: Internal Medicine

## 2017-01-26 ENCOUNTER — Other Ambulatory Visit: Payer: Self-pay | Admitting: Internal Medicine

## 2017-01-26 MED ORDER — LEVOTHYROXINE SODIUM 25 MCG PO TABS: 25.0000 ug | ORAL_TABLET | Freq: Every day | ORAL | 0 refills | Status: DC

## 2017-01-28 ENCOUNTER — Telehealth: Payer: Self-pay

## 2017-01-28 ENCOUNTER — Telehealth: Payer: Self-pay | Admitting: Internal Medicine

## 2017-01-28 DIAGNOSIS — Z79899 Other long term (current) drug therapy: Secondary | ICD-10-CM

## 2017-01-28 MED ORDER — RED YEAST RICE 600 MG PO CAPS: 1.0000 | ORAL_CAPSULE | Freq: Two times a day (BID) | ORAL | 1 refills | Status: DC

## 2017-01-28 NOTE — Telephone Encounter (Signed)
Spoke with pt and she stated that she no longer uses mychart. I am going to have someone show me tomorrow how to deactivate a mychart account. But the pt stated that she would like to try the red yeast rice first and see if that helps her.

## 2017-01-28 NOTE — Telephone Encounter (Signed)
rx sent,  For RYR.  cmet 6 weeks  Ordered.

## 2017-01-28 NOTE — Telephone Encounter (Signed)
Pt called to get lab results from last Thursday. Please advise?  Call pt @ [phone]. Thank you!

## 2017-01-28 NOTE — Telephone Encounter (Signed)
Error

## 2017-01-29 NOTE — Telephone Encounter (Signed)
Notified pt and scheduled the pt a lab appt. Pt is aware of appt date and time.

## 2017-02-11 NOTE — Telephone Encounter (Signed)
Mailed unread message to patient, thanks 

## 2017-02-15 NOTE — Telephone Encounter (Signed)
Declined

## 2017-02-27 ENCOUNTER — Other Ambulatory Visit: Payer: Self-pay | Admitting: Internal Medicine

## 2017-03-12 ENCOUNTER — Other Ambulatory Visit (INDEPENDENT_AMBULATORY_CARE_PROVIDER_SITE_OTHER): Payer: PPO

## 2017-03-12 ENCOUNTER — Telehealth: Payer: Self-pay | Admitting: *Deleted

## 2017-03-12 DIAGNOSIS — Z79899 Other long term (current) drug therapy: Secondary | ICD-10-CM

## 2017-03-12 DIAGNOSIS — E785 Hyperlipidemia, unspecified: Secondary | ICD-10-CM

## 2017-03-12 DIAGNOSIS — E039 Hypothyroidism, unspecified: Secondary | ICD-10-CM

## 2017-03-12 LAB — MICROALBUMIN / CREATININE URINE RATIO
Creatinine,U: 59.7 mg/dL
MICROALB UR: 5.1 mg/dL — AB (ref 0.0–1.9)
Microalb Creat Ratio: 8.5 mg/g (ref 0.0–30.0)

## 2017-03-12 LAB — COMPREHENSIVE METABOLIC PANEL
ALBUMIN: 4 g/dL (ref 3.5–5.2)
ALT: 20 U/L (ref 0–35)
AST: 28 U/L (ref 0–37)
Alkaline Phosphatase: 53 U/L (ref 39–117)
BILIRUBIN TOTAL: 0.5 mg/dL (ref 0.2–1.2)
BUN: 17 mg/dL (ref 6–23)
CALCIUM: 9.6 mg/dL (ref 8.4–10.5)
CO2: 26 mEq/L (ref 19–32)
Chloride: 98 mEq/L (ref 96–112)
Creatinine, Ser: 1.07 mg/dL (ref 0.40–1.20)
GFR: 53.08 mL/min — AB (ref >60.00)
Glucose, Bld: 110 mg/dL — ABNORMAL HIGH (ref 70–99)
Potassium: 4.4 mEq/L (ref 3.5–5.1)
Sodium: 132 mEq/L — ABNORMAL LOW (ref 135–145)
TOTAL PROTEIN: 6.8 g/dL (ref 6.0–8.3)

## 2017-03-12 NOTE — Telephone Encounter (Signed)
Pt came in today for labs. A CMP was ordered but patient thought she was suppose to have TSH rechecked since her meds were changed 6 weeks ago. It is noted in a phone note to have the CMP done in 6 weeks but I need to know if TSH needs to be added (see mychart from 01/26/17)

## 2017-03-12 NOTE — Addendum Note (Signed)
Addended by: Warden FillersWRIGHT, LATOYA S on: 03/12/2017 02:49 PM   Modules accepted: Orders

## 2017-03-13 ENCOUNTER — Other Ambulatory Visit (INDEPENDENT_AMBULATORY_CARE_PROVIDER_SITE_OTHER): Payer: PPO

## 2017-03-13 DIAGNOSIS — E039 Hypothyroidism, unspecified: Secondary | ICD-10-CM | POA: Diagnosis not present

## 2017-03-13 LAB — TSH: TSH: 3 u[IU]/mL (ref 0.35–4.50)

## 2017-03-13 NOTE — Telephone Encounter (Signed)
Add on sheet faxed to lab 

## 2017-03-13 NOTE — Telephone Encounter (Signed)
Yes tsh  Should have been ordered,  I added the order,  Thanks

## 2017-03-18 ENCOUNTER — Telehealth: Payer: Self-pay | Admitting: Internal Medicine

## 2017-03-18 NOTE — Telephone Encounter (Signed)
Pt called to report her BP readings. 03/16/17 Am 08:30 138/70                Pm 04:30 132/76   03/17/17 Am   08:00 128/75     Pm 05:00 130/68  03/18/17 Am 09:00  132/68  Thank you!

## 2017-03-18 NOTE — Telephone Encounter (Signed)
bp's better no changes today

## 2017-03-18 NOTE — Telephone Encounter (Signed)
Please advise, thanks.

## 2017-03-19 NOTE — Telephone Encounter (Signed)
Spoke with patient and advised of your statement. thanks

## 2017-05-02 ENCOUNTER — Other Ambulatory Visit: Payer: Self-pay | Admitting: Internal Medicine

## 2017-06-08 ENCOUNTER — Other Ambulatory Visit: Payer: Self-pay | Admitting: Internal Medicine

## 2017-06-10 NOTE — Telephone Encounter (Signed)
Refilled: 12/06/2016 Last OV: 12/06/2016 Next OV: not scheduled

## 2017-07-15 ENCOUNTER — Telehealth: Payer: Self-pay | Admitting: Internal Medicine

## 2017-07-15 ENCOUNTER — Other Ambulatory Visit: Payer: Self-pay

## 2017-07-15 MED ORDER — LEVOTHYROXINE SODIUM 25 MCG PO TABS: 25.0000 ug | ORAL_TABLET | Freq: Every day | ORAL | 0 refills | Status: DC

## 2017-07-15 NOTE — Telephone Encounter (Signed)
Copied from CRM [id number]. Topic: General - Other >> Jul 15, 2017  2:57 PM Lelon FrohlichGolden, Tashia, [place] wrote: Reason for CRM: pt would like a call concerning her medication levothyroxine  and celebrex Please call pt [phone]

## 2017-07-15 NOTE — Telephone Encounter (Signed)
See other encounter.

## 2017-07-15 NOTE — Progress Notes (Unsigned)
Patient was needing new script of levothyroxine sent in stating that she should be taking 1 tablet all days except Sunday/Thursday she should take 2. I have sent this in to [facility]. Patient also says that she had only been taking 1 celebrex a day but feels as if she may need to start taking 2. Patient is also wondering if it would be okay for her to take Turmeric powder as a natural anti-inflammatory. She knows someone that has had good results and was wondering if she could take it with the medications she is on.

## 2017-07-16 NOTE — Progress Notes (Signed)
Patient is aware 

## 2017-07-16 NOTE — Progress Notes (Unsigned)
The celebrex dosing of 200 mg twice daily is only for acute pain,  She should not increase the dose from 200 mg daily.  She can add turmeric as a trial.

## 2017-10-14 ENCOUNTER — Telehealth: Payer: Self-pay

## 2017-10-14 DIAGNOSIS — M25551 Pain in right hip: Principal | ICD-10-CM

## 2017-10-14 DIAGNOSIS — G8929 Other chronic pain: Secondary | ICD-10-CM

## 2017-10-14 NOTE — Telephone Encounter (Signed)
Please advise 

## 2017-10-14 NOTE — Telephone Encounter (Signed)
It depends on where [place] wants to fo ([place]  [place] [facility] [place]   [place]

## 2017-10-14 NOTE — Telephone Encounter (Signed)
Copied from CRM [id number]. Topic: Referral - Request >> Oct 14, 2017  9:19 AM Diana EvesHoyt, Maryann B wrote: Reason for CRM: pt is requesting a referral to ortho for hip pain. She is wanting Dr. Melina Schoolsullo's opinion on who would be the best to go to.

## 2017-10-16 NOTE — Telephone Encounter (Signed)
  Your referral is in process as requested to dr Martha ClanKrasinski Our referral coordinator will call you when the appointment has been made.  If you do not hear [facility] in our office in a week,  Please call us back

## 2017-10-16 NOTE — Addendum Note (Signed)
Addended by: Sherlene ShamsULLO, TERESA L on: 10/16/2017 10:45 AM   Modules accepted: Orders

## 2017-10-16 NOTE — Telephone Encounter (Signed)
Patient stated she prefers to go where, PCP feels is the best choice.

## 2017-10-23 DIAGNOSIS — M1611 Unilateral primary osteoarthritis, right hip: Secondary | ICD-10-CM | POA: Diagnosis not present

## 2017-10-23 DIAGNOSIS — M5136 Other intervertebral disc degeneration, lumbar region: Secondary | ICD-10-CM | POA: Diagnosis not present

## 2017-10-27 ENCOUNTER — Other Ambulatory Visit: Payer: Self-pay | Admitting: Internal Medicine

## 2017-10-27 DIAGNOSIS — E559 Vitamin D deficiency, unspecified: Secondary | ICD-10-CM

## 2017-10-27 DIAGNOSIS — E039 Hypothyroidism, unspecified: Secondary | ICD-10-CM

## 2017-10-27 DIAGNOSIS — E785 Hyperlipidemia, unspecified: Secondary | ICD-10-CM

## 2017-10-27 DIAGNOSIS — I10 Essential (primary) hypertension: Secondary | ICD-10-CM

## 2017-10-27 DIAGNOSIS — R7301 Impaired fasting glucose: Secondary | ICD-10-CM

## 2017-10-28 NOTE — Telephone Encounter (Signed)
Refilled: 06/13/2017 Last OV: 12/06/2016 Next OV: not scheduled Abnormal Kidney function on 03/12/2017

## 2017-10-30 NOTE — Telephone Encounter (Signed)
Thanks.  Reviewed labs.  Ok to continue but needs BP check and BMET in next 30 days

## 2017-11-01 NOTE — Telephone Encounter (Signed)
Pt stated that since she is due for her yearly appt with you that she would like to just wait and have her blood pressure checked then and have her lab work done with all the other lab work.   Labs ordered:  Lipid Microalbumin/creatine A1C Vitamin D

## 2017-11-07 DIAGNOSIS — R2689 Other abnormalities of gait and mobility: Secondary | ICD-10-CM | POA: Diagnosis not present

## 2017-11-07 DIAGNOSIS — M25551 Pain in right hip: Secondary | ICD-10-CM | POA: Diagnosis not present

## 2017-11-20 DIAGNOSIS — M25551 Pain in right hip: Secondary | ICD-10-CM | POA: Diagnosis not present

## 2017-11-20 DIAGNOSIS — M25651 Stiffness of right hip, not elsewhere classified: Secondary | ICD-10-CM | POA: Diagnosis not present

## 2017-11-25 ENCOUNTER — Other Ambulatory Visit: Payer: Self-pay | Admitting: Internal Medicine

## 2017-11-25 DIAGNOSIS — Z1231 Encounter for screening mammogram for malignant neoplasm of breast: Secondary | ICD-10-CM

## 2017-11-28 DIAGNOSIS — M25651 Stiffness of right hip, not elsewhere classified: Secondary | ICD-10-CM | POA: Diagnosis not present

## 2017-11-28 DIAGNOSIS — M25551 Pain in right hip: Secondary | ICD-10-CM | POA: Diagnosis not present

## 2017-12-05 DIAGNOSIS — M25551 Pain in right hip: Secondary | ICD-10-CM | POA: Diagnosis not present

## 2017-12-05 DIAGNOSIS — M25651 Stiffness of right hip, not elsewhere classified: Secondary | ICD-10-CM | POA: Diagnosis not present

## 2017-12-06 ENCOUNTER — Other Ambulatory Visit (INDEPENDENT_AMBULATORY_CARE_PROVIDER_SITE_OTHER): Payer: PPO

## 2017-12-06 DIAGNOSIS — E039 Hypothyroidism, unspecified: Secondary | ICD-10-CM

## 2017-12-06 DIAGNOSIS — I10 Essential (primary) hypertension: Secondary | ICD-10-CM | POA: Diagnosis not present

## 2017-12-06 DIAGNOSIS — R7301 Impaired fasting glucose: Secondary | ICD-10-CM | POA: Diagnosis not present

## 2017-12-06 DIAGNOSIS — E785 Hyperlipidemia, unspecified: Secondary | ICD-10-CM | POA: Diagnosis not present

## 2017-12-06 DIAGNOSIS — E559 Vitamin D deficiency, unspecified: Secondary | ICD-10-CM | POA: Diagnosis not present

## 2017-12-06 LAB — BASIC METABOLIC PANEL
BUN: 11 mg/dL (ref 6–23)
CALCIUM: 9.8 mg/dL (ref 8.4–10.5)
CHLORIDE: 100 meq/L (ref 96–112)
CO2: 26 mEq/L (ref 19–32)
Creatinine, Ser: 1.09 mg/dL (ref 0.40–1.20)
GFR: 51.85 mL/min — AB (ref >60.00)
Glucose, Bld: 110 mg/dL — ABNORMAL HIGH (ref 70–99)
Potassium: 4.2 mEq/L (ref 3.5–5.1)
Sodium: 134 mEq/L — ABNORMAL LOW (ref 135–145)

## 2017-12-06 LAB — LIPID PANEL
CHOL/HDL RATIO: 4
Cholesterol: 244 mg/dL — ABNORMAL HIGH (ref 0–200)
HDL: 62.4 mg/dL (ref >39.00)
LDL CALC: 155 mg/dL — AB (ref 0–99)
NONHDL: 181.48
Triglycerides: 134 mg/dL (ref 0.0–149.0)
VLDL: 26.8 mg/dL (ref 0.0–40.0)

## 2017-12-06 LAB — TSH: TSH: 2.8 u[IU]/mL (ref 0.35–4.50)

## 2017-12-06 LAB — HEMOGLOBIN A1C: HEMOGLOBIN A1C: 5.7 % (ref 4.6–6.5)

## 2017-12-06 LAB — VITAMIN D 25 HYDROXY (VIT D DEFICIENCY, FRACTURES): VITD: 47.16 ng/mL (ref 30.00–100.00)

## 2017-12-10 ENCOUNTER — Encounter: Payer: Self-pay | Admitting: Internal Medicine

## 2017-12-10 ENCOUNTER — Ambulatory Visit (INDEPENDENT_AMBULATORY_CARE_PROVIDER_SITE_OTHER): Payer: PPO | Admitting: Internal Medicine

## 2017-12-10 VITALS — BP 140/82 | HR 60 | Temp 98.2°F | Resp 15 | Ht 61.0 in | Wt 164.4 lb

## 2017-12-10 DIAGNOSIS — G8929 Other chronic pain: Secondary | ICD-10-CM

## 2017-12-10 DIAGNOSIS — E785 Hyperlipidemia, unspecified: Secondary | ICD-10-CM

## 2017-12-10 DIAGNOSIS — M25551 Pain in right hip: Secondary | ICD-10-CM | POA: Diagnosis not present

## 2017-12-10 DIAGNOSIS — R030 Elevated blood-pressure reading, without diagnosis of hypertension: Secondary | ICD-10-CM

## 2017-12-10 DIAGNOSIS — E039 Hypothyroidism, unspecified: Secondary | ICD-10-CM | POA: Diagnosis not present

## 2017-12-10 DIAGNOSIS — I10 Essential (primary) hypertension: Secondary | ICD-10-CM

## 2017-12-10 DIAGNOSIS — Z0000 Encounter for general adult medical examination without abnormal findings: Secondary | ICD-10-CM

## 2017-12-10 DIAGNOSIS — Z0001 Encounter for general adult medical examination with abnormal findings: Secondary | ICD-10-CM | POA: Diagnosis not present

## 2017-12-10 LAB — MICROALBUMIN / CREATININE URINE RATIO
Creatinine,U: 48.9 mg/dL
Microalb Creat Ratio: 1.4 mg/g (ref 0.0–30.0)

## 2017-12-10 MED ORDER — ZOSTER VAC RECOMB ADJUVANTED 50 MCG/0.5ML IM SUSR: 0.5000 mL | Freq: Once | INTRAMUSCULAR | 1 refills | Status: AC

## 2017-12-10 NOTE — Progress Notes (Signed)
Patient ID: Mary Vasquez, female    DOB: 24-Aug-1940  Age: 77 y.o. MRN: [id number]  The patient is here for annual preventive examination and management of other chronic and acute problems including hypertension,  Hypothyroidism ad hyperlipidemia.   The risk factors are reflected in the social history.  The roster of all physicians providing medical care to patient - is listed in the Snapshot section of the chart.  Activities of daily living:  The patient is 100% independent in all ADLs: dressing, toileting, feeding as well as independent mobility  Home safety : The patient has smoke detectors in the home. They wear seatbelts.  There are no firearms at home. There is no violence in the home.   There is no risks for hepatitis, STDs or HIV. There is no   history of blood transfusion. They have no travel history to infectious disease endemic areas of the world.  The patient has seen their dentist in the last six month. They have seen their eye doctor in the last year. They admit to slight hearing difficulty with regard to whispered voices and some television programs.  They have deferred audiologic testing in the last year.  They do not  have excessive sun exposure. Discussed the need for sun protection: hats, long sleeves and use of sunscreen if there is significant sun exposure.   Diet: the importance of a healthy diet is discussed. They do have a healthy diet.  The benefits of regular aerobic exercise were discussed. She walks 4 times per week ,  20 minutes.   Depression screen: there are no signs or vegative symptoms of depression- irritability, change in appetite, anhedonia, sadness/tearfullness.  Cognitive assessment: the patient manages all their financial and personal affairs and is actively engaged. They could relate day,date,year and events; recalled 2/3 objects at 3 minutes; performed clock-face test normally.  The following portions of the patient's history were reviewed and updated as  appropriate: allergies, current medications, past family history, past medical history,  past surgical history, past social history  and problem list.  Visual acuity was not assessed per patient preference since she has regular follow up with her ophthalmologist. Hearing and body mass index were assessed and reviewed.   During the course of the visit the patient was educated and counseled about appropriate screening and preventive services including : fall prevention , diabetes screening, nutrition counseling, colorectal cancer screening, and recommended immunizations.    CC: Diagnoses of Essential hypertension, Hyperlipidemia with target LDL less than 100, Encounter for preventive health examination, Acquired hypothyroidism, Chronic hip pain, right, and Elevated blood pressure reading in office without diagnosis of hypertension were pertinent to this visit. DJD right hip (severe) Hip pain has seen improvement  with PT for the past month  Was worse during the winter,  Not taking tylenol on a daily basis.  Taking celebrex  Sleeping a lot after a full day of activity . averaging 9 hours of sleep per night.  Lives alone. No snoring. No falls .  No trouble with bowels or bladder.  Drinks green tea instead of coffee.    History Mary Vasquez has a past medical history of Hyperlipidemia and Hypertension.   She has a past surgical history that includes Hysteroscopy (2008); Rotator cuff repair (2009); Breast surgery (2009); and Breast biopsy (Left, 2009).   Her family history includes Breast cancer in her cousin and paternal aunt; Cancer in her brother; Diabetes in her brother; Heart disease in her father; Heart disease (age of onset:  65) in her mother.She reports that she has never smoked. She has never used smokeless tobacco. She reports that she drinks about 4.2 oz of alcohol per week. She reports that she does not use drugs.  Outpatient Medications Prior to Visit  Medication Sig Dispense Refill  .  celecoxib (CELEBREX) 200 MG capsule TAKE 1 CAPSULE (200 MG TOTAL) BY MOUTH 2 (TWO) TIMES DAILY. 60 capsule 1  . cetirizine (ZYRTEC) 10 MG tablet Take 10 mg by mouth daily.    Marland Kitchen levothyroxine (SYNTHROID, LEVOTHROID) 25 MCG tablet Take 1 tablet (25 mcg total) by mouth daily before breakfast. 2 on Sunday and Thursday 114 tablet 0  . meclizine (ANTIVERT) 25 MG tablet Take 1 tablet (25 mg total) by mouth 3 (three) times daily as needed for dizziness. 30 tablet 0  . Misc Natural Products (OSTEO BI-FLEX TRIPLE STRENGTH PO) Take 2 tablets by mouth daily.    . Red Yeast Rice 600 MG CAPS Take 1 capsule (600 mg total) by mouth 2 (two) times daily. 180 capsule 1  . TURMERIC PO Take by mouth.     No facility-administered medications prior to visit.     Review of Systems   Patient denies headache, fevers, malaise, unintentional weight loss, skin rash, eye pain, sinus congestion and sinus pain, sore throat, dysphagia,  hemoptysis , cough, dyspnea, wheezing, chest pain, palpitations, orthopnea, edema, abdominal pain, nausea, melena, diarrhea, constipation, flank pain, dysuria, hematuria, urinary  Frequency, nocturia, numbness, tingling, seizures,  Focal weakness, Loss of consciousness,  Tremor, insomnia, depression, anxiety, and suicidal ideation.      Objective:  BP 140/82 (BP Location: Left Arm, Patient Position: Sitting, Cuff Size: Normal)   Pulse 60   Temp 98.2 F (36.8 C) (Oral)   Resp 15   Ht 5\' 1"  (1.549 m)   Wt 164 lb 6.4 oz (74.6 kg)   SpO2 98%   BMI 31.06 kg/m   Physical Exam   General appearance: alert, cooperative and appears stated age Head: Normocephalic, without obvious abnormality, atraumatic Eyes: conjunctivae/corneas clear. PERRL, EOM's intact. Fundi benign. Ears: normal TM's and external ear canals both ears Nose: Nares normal. Septum midline. Mucosa normal. No drainage or sinus tenderness. Throat: lips, mucosa, and tongue normal; teeth and gums normal Neck: no adenopathy, no  carotid bruit, no JVD, supple, symmetrical, trachea midline and thyroid not enlarged, symmetric, no tenderness/mass/nodules Lungs: clear to auscultation bilaterally Breasts: normal appearance, no masses or tenderness Heart: regular rate and rhythm, S1, S2 normal, no murmur, click, rub or gallop Abdomen: soft, non-tender; bowel sounds normal; no masses,  no organomegaly Extremities: extremities normal, atraumatic, no cyanosis or edema Pulses: 2+ and symmetric Skin: Skin color, texture, turgor normal. No rashes or lesions Neurologic: Alert and oriented X 3, normal strength and tone. Normal symmetric reflexes. Normal coordination and gait.      Assessment & Plan:   Problem List Items Addressed This Visit    Hypothyroidism    Thyroid function is WNL on current dose.  No current changes needed.   Lab Results  Component Value Date   TSH 2.80 12/06/2017         Hyperlipidemia with target LDL less than 100    Prefers to avoid statins. She is  taking RYR 1200 mg daily instead of 600 mg bid. Will recheck in 3 months  Lab Results  Component Value Date   CHOL 244 (H) 12/06/2017   HDL 62.40 12/06/2017   LDLCALC 155 (H) 12/06/2017   LDLDIRECT 91.0 09/09/2015  TRIG 134.0 12/06/2017   CHOLHDL 4 12/06/2017         Relevant Orders   Lipid panel   Encounter for preventive health examination    Annual comprehensive preventive exam was done as well as an evaluation and management of chronic conditions .  During the course of the visit the patient was educated and counseled about appropriate screening and preventive services including :  diabetes screening, lipid analysis with projected  10 year  risk for CAD , nutrition counseling, breast, cervical and colorectal cancer screening, and recommended immunizations.  Printed recommendations for health maintenance screenings was given      Elevated blood pressure reading in office without diagnosis of hypertension    Elevated today.  she  has  been asked to check his BP at work and  submit readings for evaluation. Renal function will be checked today.  Lab Results  Component Value Date   CREATININE 1.09 12/06/2017   Lab Results  Component Value Date   NA 134 (L) 12/06/2017   K 4.2 12/06/2017   CL 100 12/06/2017   CO2 26 12/06/2017         Chronic hip pain, right    Secondary to severe DJD by plain films .   Marked degenerative change with subchondral cyst formation and spurring, with only mild degenerative changes of the lumbar spine .  Improved with PT.  She defers analgesics ,  Using celebrex /tylenol for now.  Orthopedics referral when ready.          I am having Mary Vasquez start on Zoster Vaccine Adjuvanted. I am also having her maintain her cetirizine, meclizine, Misc Natural Products (OSTEO BI-FLEX TRIPLE STRENGTH PO), Red Yeast Rice, levothyroxine, celecoxib, and TURMERIC PO.  Meds ordered this encounter  Medications  . Zoster Vaccine Adjuvanted [facility](SHINGRIX) injection    Sig: Inject 0.5 mLs into the muscle once for 1 dose.    Dispense:  1 each    Refill:  1    There are no discontinued medications.  Follow-up: No follow-ups on file.   Sherlene Shamseresa L Illianna Paschal, MD

## 2017-12-10 NOTE — Assessment & Plan Note (Addendum)
Prefers to avoid statins. She is  taking RYR 1200 mg daily instead of 600 mg bid. Will recheck in 3 months  Lab Results  Component Value Date   CHOL 244 (H) 12/06/2017   HDL 62.40 12/06/2017   LDLCALC 155 (H) 12/06/2017   LDLDIRECT 91.0 09/09/2015   TRIG 134.0 12/06/2017   CHOLHDL 4 12/06/2017

## 2017-12-10 NOTE — Patient Instructions (Addendum)
The new goals for optimal blood pressure management are 120/70.  I am aiming for 130/80 in patients your age   Please check your blood pressure a few times at your pharmacy  and send me the readings so I can determine if you need a change in medication    Your cholesterol is still mildly elevated.  Please split your Red Yeast Rice into two daily doses .    We can repeat the panel in 3 months   I recommend taking 1000 mg of tylenol prior to your coliseum climb  On Saturday    The ShingRx vaccine is now available in local pharmacies and is much more protective thant Zostavaxs,  It is therefore ADVISED for all interested adults over 50 to prevent shingles   Health Maintenance for Postmenopausal Women Menopause is a normal process in which your reproductive ability comes to an end. This process happens gradually over a span of months to years, usually between the ages of 63 and 12. Menopause is complete when you have missed 12 consecutive menstrual periods. It is important to talk with your health care provider about some of the most common conditions that affect postmenopausal women, such as heart disease, cancer, and bone loss (osteoporosis). Adopting a healthy lifestyle and getting preventive care can help to promote your health and wellness. Those actions can also lower your chances of developing some of these common conditions. What should I know about menopause? During menopause, you may experience a number of symptoms, such as:  Moderate-to-severe hot flashes.  Night sweats.  Decrease in sex drive.  Mood swings.  Headaches.  Tiredness.  Irritability.  Memory problems.  Insomnia.  Choosing to treat or not to treat menopausal changes is an individual decision that you make with your health care provider. What should I know about hormone replacement therapy and supplements? Hormone therapy products are effective for treating symptoms that are associated with menopause, such  as hot flashes and night sweats. Hormone replacement carries certain risks, especially as you become older. If you are thinking about using estrogen or estrogen with progestin treatments, discuss the benefits and risks with your health care provider. What should I know about heart disease and stroke? Heart disease, heart attack, and stroke become more likely as you age. This may be due, in part, to the hormonal changes that your body experiences during menopause. These can affect how your body processes dietary fats, triglycerides, and cholesterol. Heart attack and stroke are both medical emergencies. There are many things that you can do to help prevent heart disease and stroke:  Have your blood pressure checked at least every 1-2 years. High blood pressure causes heart disease and increases the risk of stroke.  If you are 17-41 years old, ask your health care provider if you should take aspirin to prevent a heart attack or a stroke.  Do not use any tobacco products, including cigarettes, chewing tobacco, or electronic cigarettes. If you need help quitting, ask your health care provider.  It is important to eat a healthy diet and maintain a healthy weight. ? Be sure to include plenty of vegetables, fruits, low-fat dairy products, and lean protein. ? Avoid eating foods that are high in solid fats, added sugars, or salt (sodium).  Get regular exercise. This is one of the most important things that you can do for your health. ? Try to exercise for at least 150 minutes each week. The type of exercise that you do should increase your heart  rate and make you sweat. This is known as moderate-intensity exercise. ? Try to do strengthening exercises at least twice each week. Do these in addition to the moderate-intensity exercise.  Know your numbers.Ask your health care provider to check your cholesterol and your blood glucose. Continue to have your blood tested as directed by your health care  provider.  What should I know about cancer screening? There are several types of cancer. Take the following steps to reduce your risk and to catch any cancer development as early as possible. Breast Cancer  Practice breast self-awareness. ? This means understanding how your breasts normally appear and feel. ? It also means doing regular breast self-exams. Let your health care provider know about any changes, no matter how small.  If you are 49 or older, have a clinician do a breast exam (clinical breast exam or CBE) every year. Depending on your age, family history, and medical history, it may be recommended that you also have a yearly breast X-ray (mammogram).  If you have a family history of breast cancer, talk with your health care provider about genetic screening.  If you are at high risk for breast cancer, talk with your health care provider about having an MRI and a mammogram every year.  Breast cancer (BRCA) gene test is recommended for women who have family members with BRCA-related cancers. Results of the assessment will determine the need for genetic counseling and BRCA1 and for BRCA2 testing. BRCA-related cancers include these types: ? Breast. This occurs in males or females. ? Ovarian. ? Tubal. This may also be called fallopian tube cancer. ? Cancer of the abdominal or pelvic lining (peritoneal cancer). ? Prostate. ? Pancreatic.  Cervical, Uterine, and Ovarian Cancer Your health care provider may recommend that you be screened regularly for cancer of the pelvic organs. These include your ovaries, uterus, and vagina. This screening involves a pelvic exam, which includes checking for microscopic changes to the surface of your cervix (Pap test).  For women ages 21-65, health care providers may recommend a pelvic exam and a Pap test every three years. For women ages 2-65, they may recommend the Pap test and pelvic exam, combined with testing for human papilloma virus (HPV), every  five years. Some types of HPV increase your risk of cervical cancer. Testing for HPV may also be done on women of any age who have unclear Pap test results.  Other health care providers may not recommend any screening for nonpregnant women who are considered low risk for pelvic cancer and have no symptoms. Ask your health care provider if a screening pelvic exam is right for you.  If you have had past treatment for cervical cancer or a condition that could lead to cancer, you need Pap tests and screening for cancer for at least 20 years after your treatment. If Pap tests have been discontinued for you, your risk factors (such as having a new sexual partner) need to be reassessed to determine if you should start having screenings again. Some women have medical problems that increase the chance of getting cervical cancer. In these cases, your health care provider may recommend that you have screening and Pap tests more often.  If you have a family history of uterine cancer or ovarian cancer, talk with your health care provider about genetic screening.  If you have vaginal bleeding after reaching menopause, tell your health care provider.  There are currently no reliable tests available to screen for ovarian cancer.  Lung Cancer  Lung cancer screening is recommended for adults 26-64 years old who are at high risk for lung cancer because of a history of smoking. A yearly low-dose CT scan of the lungs is recommended if you:  Currently smoke.  Have a history of at least 30 pack-years of smoking and you currently smoke or have quit within the past 15 years. A pack-year is smoking an average of one pack of cigarettes per day for one year.  Yearly screening should:  Continue until it has been 15 years since you quit.  Stop if you develop a health problem that would prevent you from having lung cancer treatment.  Colorectal Cancer  This type of cancer can be detected and can often be  prevented.  Routine colorectal cancer screening usually begins at age 91 and continues through age 75.  If you have risk factors for colon cancer, your health care provider may recommend that you be screened at an earlier age.  If you have a family history of colorectal cancer, talk with your health care provider about genetic screening.  Your health care provider may also recommend using home test kits to check for hidden blood in your stool.  A small camera at the end of a tube can be used to examine your colon directly (sigmoidoscopy or colonoscopy). This is done to check for the earliest forms of colorectal cancer.  Direct examination of the colon should be repeated every 5-10 years until age 65. However, if early forms of precancerous polyps or small growths are found or if you have a family history or genetic risk for colorectal cancer, you may need to be screened more often.  Skin Cancer  Check your skin from head to toe regularly.  Monitor any moles. Be sure to tell your health care provider: ? About any new moles or changes in moles, especially if there is a change in a mole's shape or color. ? If you have a mole that is larger than the size of a pencil eraser.  If any of your family members has a history of skin cancer, especially at a young age, talk with your health care provider about genetic screening.  Always use sunscreen. Apply sunscreen liberally and repeatedly throughout the day.  Whenever you are outside, protect yourself by wearing long sleeves, pants, a wide-brimmed hat, and sunglasses.  What should I know about osteoporosis? Osteoporosis is a condition in which bone destruction happens more quickly than new bone creation. After menopause, you may be at an increased risk for osteoporosis. To help prevent osteoporosis or the bone fractures that can happen because of osteoporosis, the following is recommended:  If you are 70-33 years old, get at least 1,000 mg of  calcium and at least 600 mg of vitamin D per day.  If you are older than age 12 but younger than age 66, get at least 1,200 mg of calcium and at least 600 mg of vitamin D per day.  If you are older than age 73, get at least 1,200 mg of calcium and at least 800 mg of vitamin D per day.  Smoking and excessive alcohol intake increase the risk of osteoporosis. Eat foods that are rich in calcium and vitamin D, and do weight-bearing exercises several times each week as directed by your health care provider. What should I know about how menopause affects my mental health? Depression may occur at any age, but it is more common as you become older. Common symptoms of depression include:  Low or sad mood.  Changes in sleep patterns.  Changes in appetite or eating patterns.  Feeling an overall lack of motivation or enjoyment of activities that you previously enjoyed.  Frequent crying spells.  Talk with your health care provider if you think that you are experiencing depression. What should I know about immunizations? It is important that you get and maintain your immunizations. These include:  Tetanus, diphtheria, and pertussis (Tdap) booster vaccine.  Influenza every year before the flu season begins.  Pneumonia vaccine.  Shingles vaccine.  Your health care provider may also recommend other immunizations. This information is not intended to replace advice given to you by your health care provider. Make sure you discuss any questions you have with your health care provider. Document Released: 08/17/2005 Document Revised: 01/13/2016 Document Reviewed: 03/29/2015 Elsevier Interactive Patient Education  2018 [employer].

## 2017-12-11 NOTE — Assessment & Plan Note (Signed)
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   Marked degenerative change with subchondral cyst formation and spurring, with only mild degenerative changes of the lumbar spine .  Improved with PT.  She defers analgesics ,  Using celebrex /tylenol for now.  Orthopedics referral when ready.

## 2017-12-11 NOTE — Assessment & Plan Note (Signed)
Annual comprehensive preventive exam was done as well as an evaluation and management of chronic conditions .  During the course of the visit the patient was educated and counseled about appropriate screening and preventive services including :  diabetes screening, lipid analysis with projected  10 year  risk for CAD , nutrition counseling, breast, cervical and colorectal cancer screening, and recommended immunizations.  Printed recommendations for health maintenance screenings was given 

## 2017-12-11 NOTE — Assessment & Plan Note (Addendum)
Elevated today.  she  has been asked to check his BP at work and  submit readings for evaluation. Renal function will be checked today.  Lab Results  Component Value Date   CREATININE 1.09 12/06/2017   Lab Results  Component Value Date   NA 134 (L) 12/06/2017   K 4.2 12/06/2017   CL 100 12/06/2017   CO2 26 12/06/2017

## 2017-12-11 NOTE — Assessment & Plan Note (Signed)
Thyroid function is WNL on current dose.  No current changes needed.   Lab Results  Component Value Date   TSH 2.80 12/06/2017

## 2017-12-12 ENCOUNTER — Telehealth: Payer: Self-pay | Admitting: Internal Medicine

## 2017-12-12 DIAGNOSIS — M25651 Stiffness of right hip, not elsewhere classified: Secondary | ICD-10-CM | POA: Diagnosis not present

## 2017-12-12 DIAGNOSIS — R2689 Other abnormalities of gait and mobility: Secondary | ICD-10-CM | POA: Diagnosis not present

## 2017-12-12 DIAGNOSIS — M25551 Pain in right hip: Secondary | ICD-10-CM | POA: Diagnosis not present

## 2017-12-12 NOTE — Telephone Encounter (Signed)
Lab results

## 2017-12-12 NOTE — Telephone Encounter (Signed)
Copied from CRM [id number]. Topic: Quick Communication - Lab Results >> Dec 12, 2017  3:25 PM Sandy SalaamWoodward, Jessica, CMA wrote: Called patient to inform them of 12/11/2017 lab results. When patient returns call, triage nurse may disclose results.  [facility] may speak with pt.  >> Dec 12, 2017  3:37 PM Darletta MollLander, Lumin L wrote: No [facility] RN free for results.

## 2017-12-12 NOTE — Telephone Encounter (Signed)
Charted in result notes. 

## 2018-01-03 ENCOUNTER — Ambulatory Visit
Admission: RE | Admit: 2018-01-03 | Discharge: 2018-01-03 | Disposition: A | Discharge status: Home / Self Care | Payer: PPO | Source: Ambulatory Visit | Attending: Internal Medicine | Admitting: Internal Medicine

## 2018-01-03 DIAGNOSIS — Z1231 Encounter for screening mammogram for malignant neoplasm of breast: Secondary | ICD-10-CM | POA: Diagnosis not present

## 2018-01-29 ENCOUNTER — Telehealth: Payer: Self-pay | Admitting: Internal Medicine

## 2018-01-29 MED ORDER — AMLODIPINE BESYLATE 2.5 MG PO TABS: 2.5000 mg | ORAL_TABLET | Freq: Every day | ORAL | 3 refills | Status: DC

## 2018-01-29 NOTE — Telephone Encounter (Signed)
FYI

## 2018-01-29 NOTE — Telephone Encounter (Signed)
Copied from CRM [id number]. Topic: General - Other >> Jan 29, 2018  2:41 PM Gerrianne ScalePayne, Leith Hedlund L wrote: Reason for CRM: pt calling with BP readings from wrist cuff  all was resting sitting on recliner July 9 140/80 63P   132/74 P56 July 10 135/76 62P 132/69 P57  148/88 p58 July 11120/71 p 67    July 12  130/77 63p     124/74 p55 July 13160/86     154/85 p58 July 14 149/82 p60   144/81 p62

## 2018-01-29 NOTE — Telephone Encounter (Signed)
bp readings reviewed,  I recommend a trial of amlodipine 2.5 mg once daily .  This medication is a calcium channel blocker, has no effect on heart rate or renal function or electrolytes .     Will send to pharmacy

## 2018-01-30 NOTE — Telephone Encounter (Signed)
Patient aware and will start amlodipine and monitor pressures.

## 2018-02-02 ENCOUNTER — Other Ambulatory Visit: Payer: Self-pay | Admitting: Internal Medicine

## 2018-02-27 IMAGING — MG MM DIGITAL SCREENING BILAT W/ TOMO W/ CAD
8 of 12 series · 8 of 28 positions shown · non-contrast
Comparison: Previous exam(s).

CLINICAL DATA: Screening.

EXAM:
2D DIGITAL SCREENING BILATERAL MAMMOGRAM WITH CAD AND ADJUNCT TOMO

[R CC]
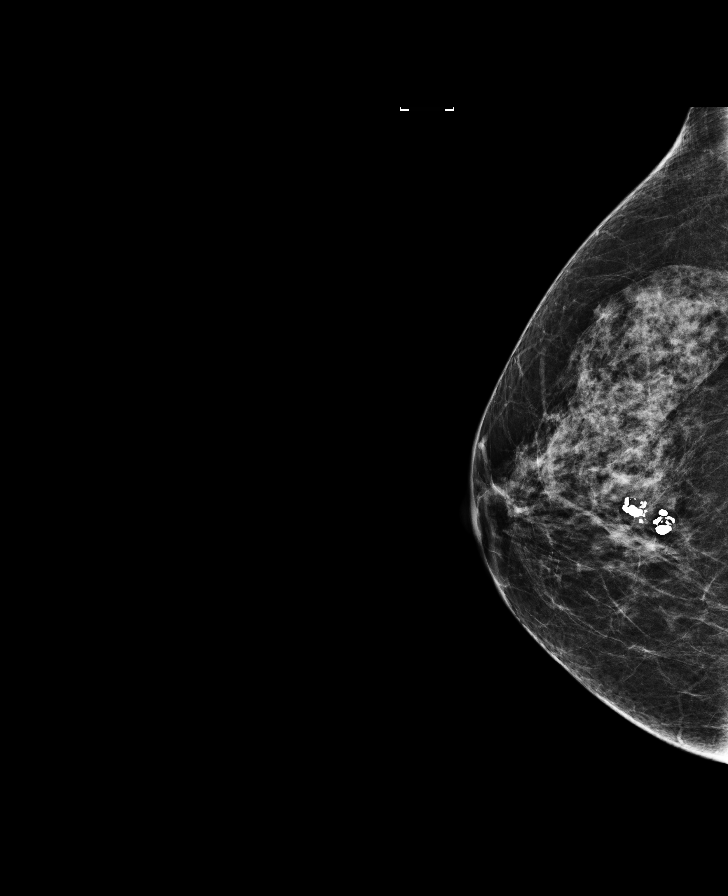

[R MLO synth-2D]
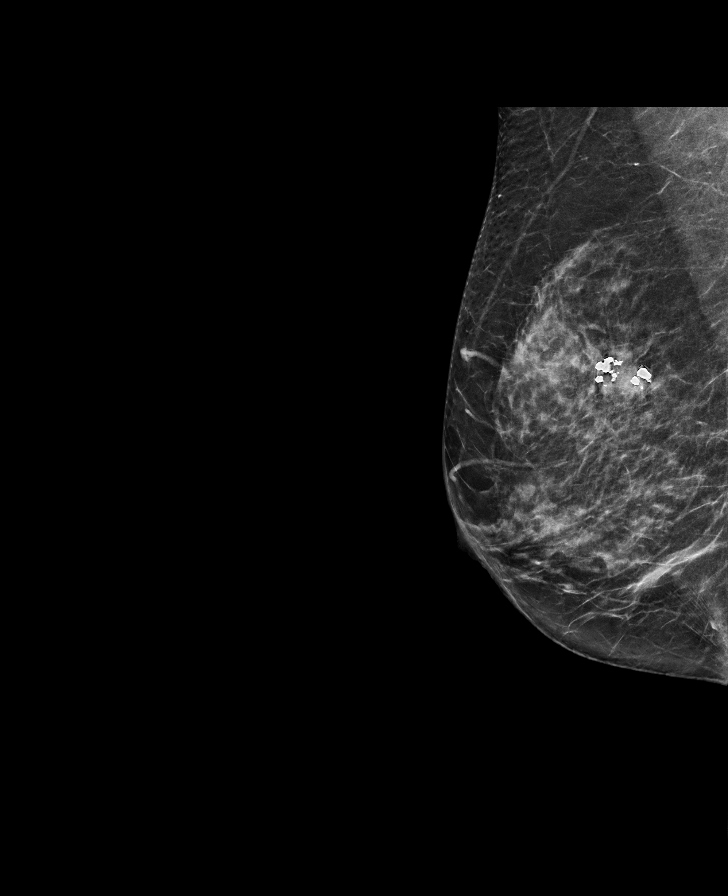

[L CC synth-2D]
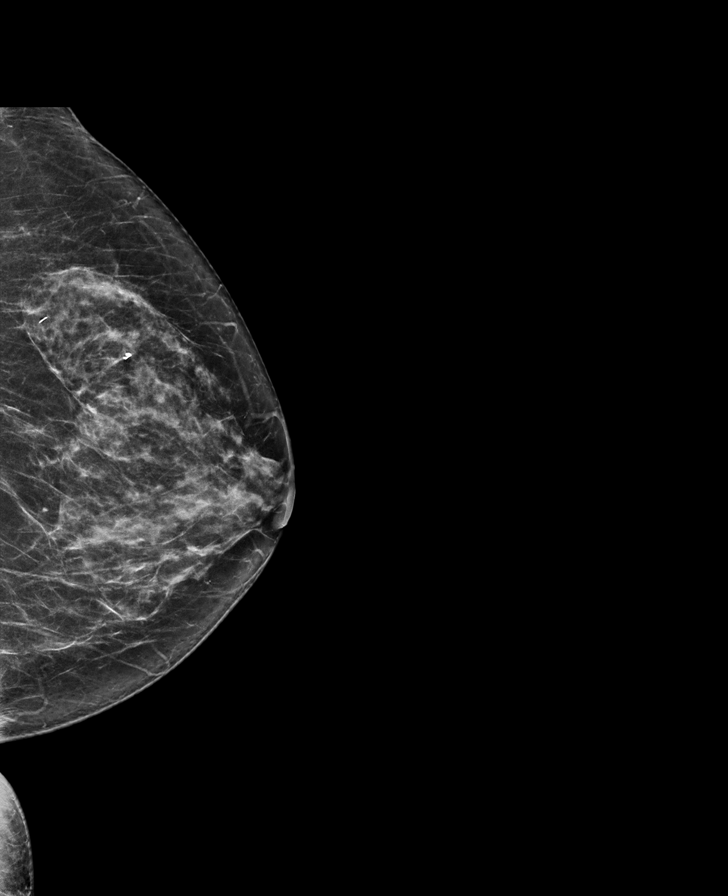

[L MLO synth-2D]
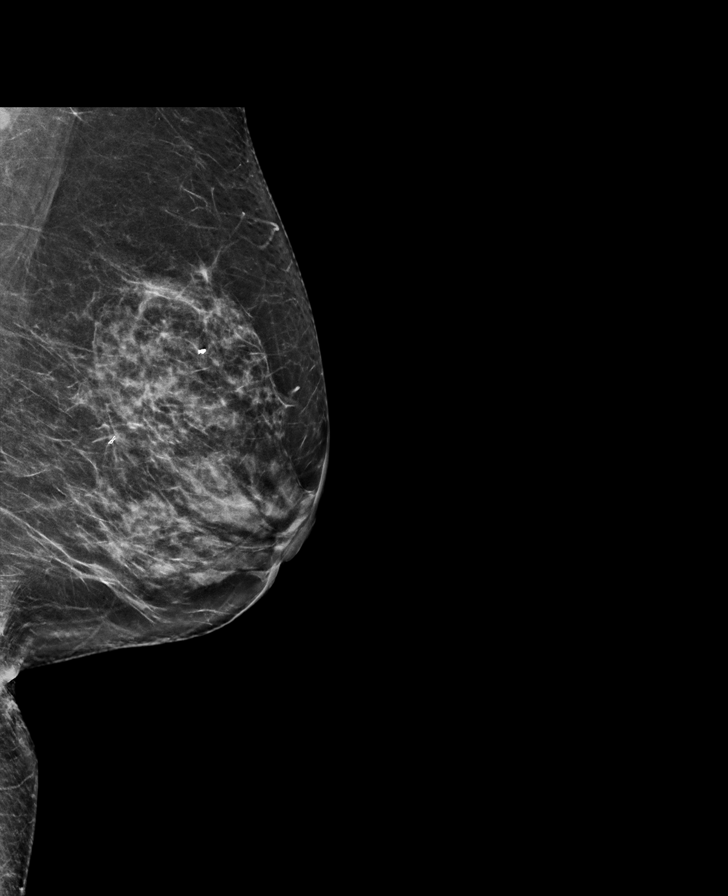

[R MLO]
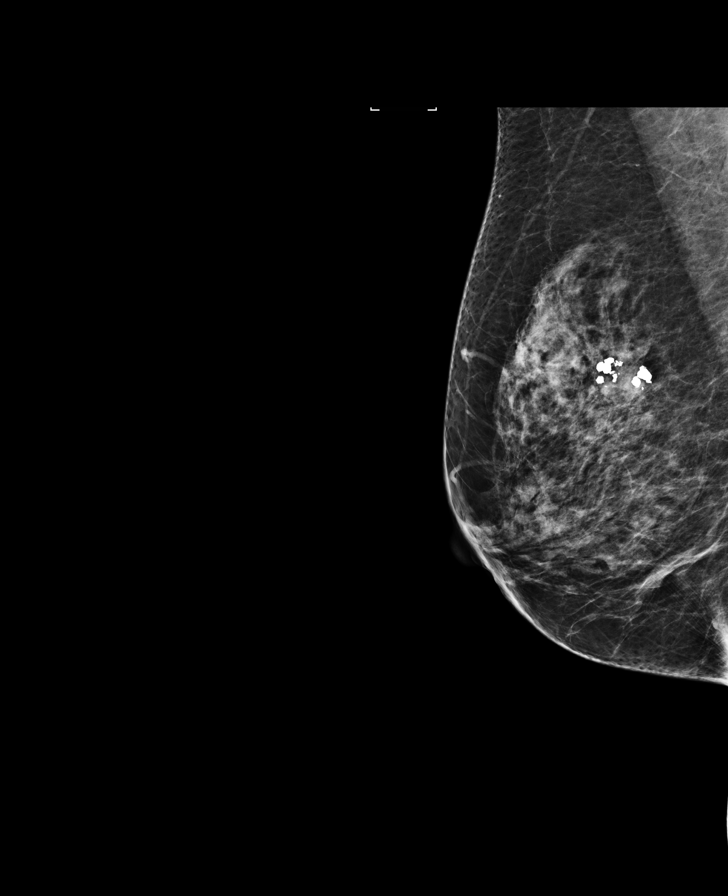

[L CC]
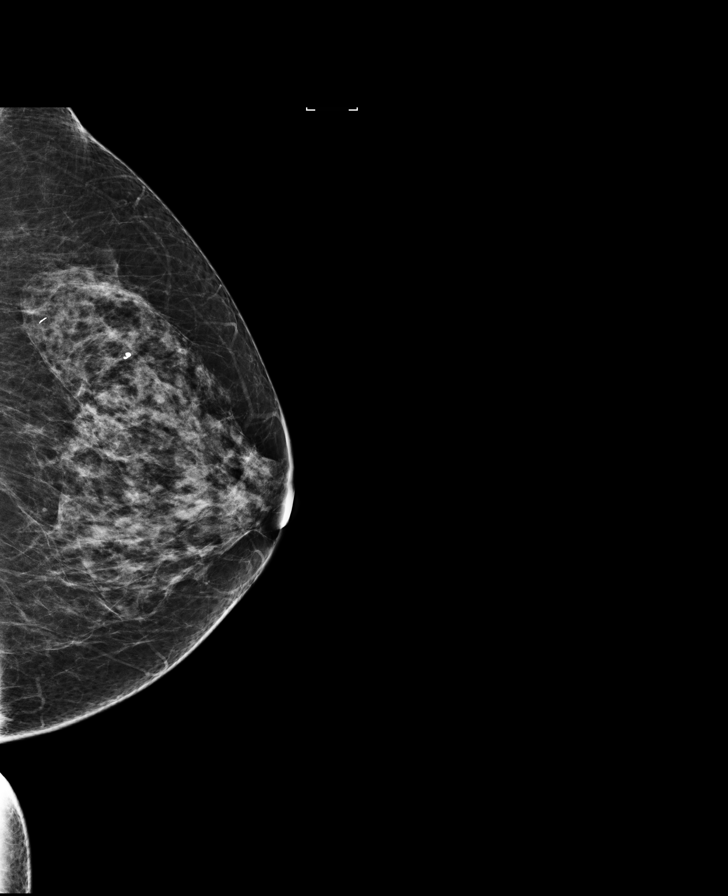

[L MLO]
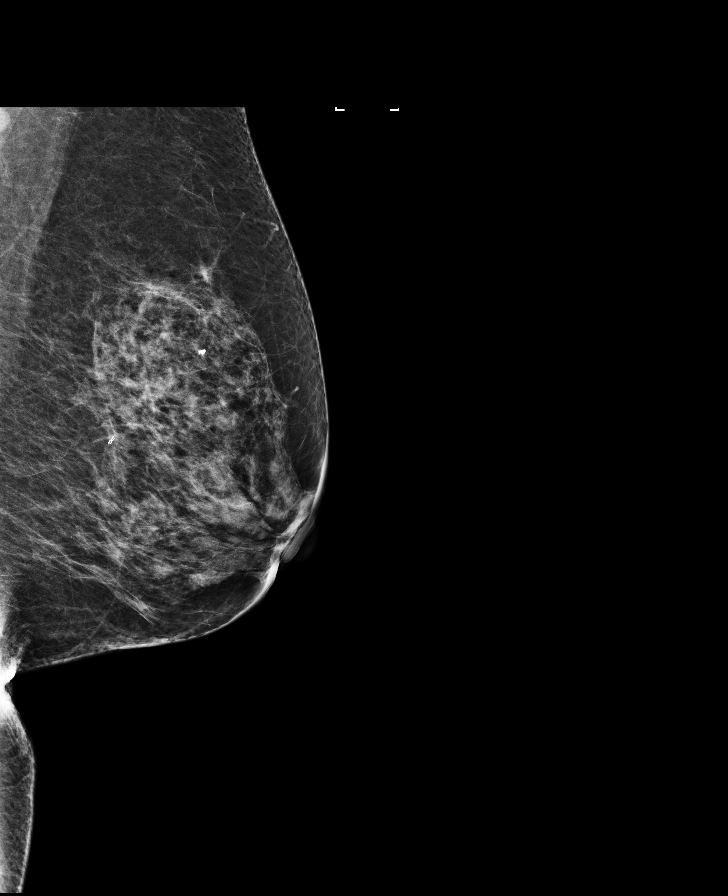

[R CC synth-2D]
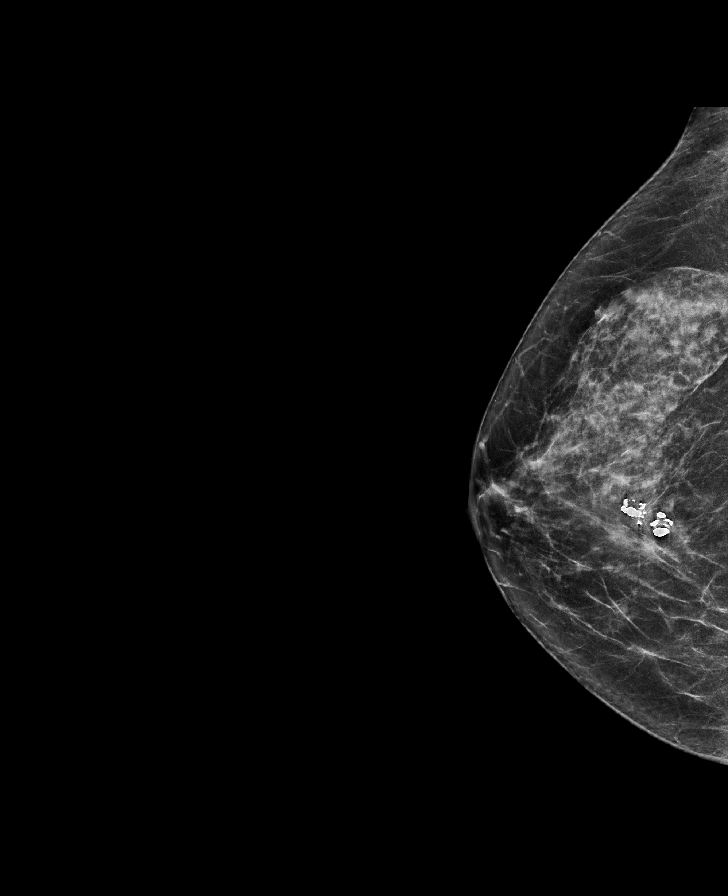

[8 of 28 positions shown; findings below may reference images not displayed]

ACR Breast Density Category c: The breast tissue is heterogeneously
dense, which may obscure small masses.
FINDINGS: There are no findings suspicious for malignancy. Images were
processed with CAD.
IMPRESSION: No mammographic evidence of malignancy. A result letter of this
screening mammogram will be mailed directly to the patient.

RECOMMENDATION:
Screening mammogram in one year. (Code:TN-[id number])

BI-RADS CATEGORY  1: Negative.

## 2018-03-02 ENCOUNTER — Other Ambulatory Visit: Payer: Self-pay | Admitting: Internal Medicine

## 2018-03-12 ENCOUNTER — Other Ambulatory Visit (INDEPENDENT_AMBULATORY_CARE_PROVIDER_SITE_OTHER): Payer: PPO

## 2018-03-12 DIAGNOSIS — E785 Hyperlipidemia, unspecified: Secondary | ICD-10-CM | POA: Diagnosis not present

## 2018-03-12 LAB — LIPID PANEL
CHOL/HDL RATIO: 4
Cholesterol: 230 mg/dL — ABNORMAL HIGH (ref 0–200)
HDL: 63.7 mg/dL (ref >39.00)
LDL Cholesterol: 143 mg/dL — ABNORMAL HIGH (ref 0–99)
NonHDL: 166.45
TRIGLYCERIDES: 118 mg/dL (ref 0.0–149.0)
VLDL: 23.6 mg/dL (ref 0.0–40.0)

## 2018-04-09 DIAGNOSIS — M1611 Unilateral primary osteoarthritis, right hip: Secondary | ICD-10-CM | POA: Diagnosis not present

## 2018-05-19 ENCOUNTER — Ambulatory Visit: Payer: Self-pay | Admitting: *Deleted

## 2018-05-19 ENCOUNTER — Encounter: Payer: Self-pay | Admitting: Family Medicine

## 2018-05-19 ENCOUNTER — Ambulatory Visit (INDEPENDENT_AMBULATORY_CARE_PROVIDER_SITE_OTHER): Payer: PPO | Admitting: Family Medicine

## 2018-05-19 VITALS — BP 154/88 | HR 81 | Temp 98.3°F | Ht 61.0 in | Wt 160.8 lb

## 2018-05-19 DIAGNOSIS — I10 Essential (primary) hypertension: Secondary | ICD-10-CM

## 2018-05-19 DIAGNOSIS — R21 Rash and other nonspecific skin eruption: Secondary | ICD-10-CM

## 2018-05-19 LAB — COMPREHENSIVE METABOLIC PANEL
ALBUMIN: 4.5 g/dL (ref 3.5–5.2)
ALT: 14 U/L (ref 0–35)
AST: 25 U/L (ref 0–37)
Alkaline Phosphatase: 64 U/L (ref 39–117)
BILIRUBIN TOTAL: 0.5 mg/dL (ref 0.2–1.2)
BUN: 14 mg/dL (ref 6–23)
CO2: 31 mEq/L (ref 19–32)
CREATININE: 1.22 mg/dL — AB (ref 0.40–1.20)
Calcium: 10.3 mg/dL (ref 8.4–10.5)
Chloride: 100 mEq/L (ref 96–112)
GFR: 45.47 mL/min — ABNORMAL LOW (ref >60.00)
Glucose, Bld: 110 mg/dL — ABNORMAL HIGH (ref 70–99)
Potassium: 4.4 mEq/L (ref 3.5–5.1)
Sodium: 137 mEq/L (ref 135–145)
Total Protein: 7.6 g/dL (ref 6.0–8.3)

## 2018-05-19 LAB — CBC WITH DIFFERENTIAL/PLATELET
BASOS PCT: 0.9 % (ref 0.0–3.0)
Basophils Absolute: 0.1 10*3/uL (ref 0.0–0.1)
Eosinophils Absolute: 0 10*3/uL (ref 0.0–0.7)
Eosinophils Relative: 0.6 % (ref 0.0–5.0)
HEMATOCRIT: 38.8 % (ref 36.0–46.0)
Hemoglobin: 13.4 g/dL (ref 12.0–15.0)
LYMPHS ABS: 1.9 10*3/uL (ref 0.7–4.0)
LYMPHS PCT: 27.8 % (ref 12.0–46.0)
MCHC: 34.7 g/dL (ref 30.0–36.0)
MCV: 92.7 fl (ref 78.0–100.0)
MONOS PCT: 7.4 % (ref 3.0–12.0)
Monocytes Absolute: 0.5 10*3/uL (ref 0.1–1.0)
NEUTROS ABS: 4.4 10*3/uL (ref 1.4–7.7)
Neutrophils Relative %: 63.3 % (ref 43.0–77.0)
PLATELETS: 252 10*3/uL (ref 150.0–400.0)
RBC: 4.18 Mil/uL (ref 3.87–5.11)
RDW: 13.2 % (ref 11.5–15.5)
WBC: 6.9 10*3/uL (ref 4.0–10.5)

## 2018-05-19 MED ORDER — TRIAMCINOLONE ACETONIDE 0.025 % EX OINT: 1.0000 "application " | TOPICAL_OINTMENT | Freq: Two times a day (BID) | CUTANEOUS | 0 refills | Status: DC

## 2018-05-19 MED ORDER — AMLODIPINE BESYLATE 5 MG PO TABS: 5.0000 mg | ORAL_TABLET | Freq: Every day | ORAL | 0 refills | Status: DC

## 2018-05-19 NOTE — Telephone Encounter (Signed)
Pt reports facial "Puffiness" and rash x 1 week, worsening this past weekend. States mild swelling around eyes, worse under eyes, corners of mouth. Also reports generalized blotchy rash "Different places around my face." States had been itchy, "Much better today" denies pain, tenderness. Denies any dietary or environmental changes. States no new medications or lotions, cosmetics. No fever. Reports had "Something similar 2-3 years ago."  Saw dermatologist but unsure of cause. Also reports mild dizziness, nausea over weekend, not presently. Appt made for today with L. Guse; care advise given per protocol

## 2018-05-19 NOTE — Telephone Encounter (Signed)
Just a FYI

## 2018-05-19 NOTE — Progress Notes (Signed)
Subjective:    Patient ID: Mary Vasquez, female    DOB: 09-22-1940, 77 y.o.   MRN: [id number]  HPI   Presents to clinic c/o swelling under eyes and "blotchiness" of face with dry skin for 1 week.  Patient denies any new soaps, new lotions, new detergents.  Patient states that something similar to this happened to her about 4 years ago, she ended up on a triamcinolone ointment and saw dermatology.  Patient also does take Zyrtec every day, states she is usually very sensitive to environmental allergens.  Patient denies any fever or chills.  Denies any sinus pressure or congestion.  Denies any headache, pain in ears, sore throat.  Patient is also concerned about her blood pressure.  She has been monitoring it, and it has been slightly elevated over the past few months; currently she is taking amlodipine 2.5 mg once daily.  Today her BP reading is 160/90.  I will re-check reading.  Patient Active Problem List   Diagnosis Date Noted  . Hypothyroidism 12/09/2016  . Adjustment disorder 12/08/2016  . Chronic hip pain, right 08/28/2016  . Breast mass, right 06/14/2014  . Rash and nonspecific skin eruption 06/14/2014  . Obesity, unspecified 04/25/2013  . Epidemic vertigo 04/25/2013  . Encounter for preventive health examination 04/25/2013  . 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 Hyperlipidemia with target LDL less than 100 02/21/2012  . Elevated blood pressure reading in office without diagnosis of hypertension    Social History   Tobacco Use  . Smoking status: Never Smoker  . Smokeless tobacco: Never Used  Substance Use Topics  . Alcohol use: Yes    Alcohol/week: 7.0 standard drinks    Types: 7 Glasses of wine per week    Review of Systems  Constitutional: Negative for chills, fatigue and fever.  HENT: +swelling under eyes, redness of face Eyes: Negative.   Respiratory: Negative for cough, shortness of breath and wheezing.   Cardiovascular: Negative for chest pain, palpitations and  leg swelling.  Gastrointestinal: Negative for abdominal pain, diarrhea, nausea and vomiting.  Genitourinary: Negative for dysuria, frequency and urgency.  Musculoskeletal: Negative for arthralgias and myalgias.  Skin: Negative for color change, pallor and rash.  Neurological: Negative for syncope, light-headedness and headaches.  Psychiatric/Behavioral: The patient is not nervous/anxious.       Objective:   Physical Exam  Constitutional: She is oriented to person, place, and time. She appears well-nourished. No distress.  HENT:  Head: Atraumatic.  Right Ear: Tympanic membrane, external ear and ear canal normal.  Left Ear: Tympanic membrane, external ear and ear canal normal.  Nose: No mucosal edema or rhinorrhea. Right sinus exhibits no maxillary sinus tenderness and no frontal sinus tenderness. Left sinus exhibits no maxillary sinus tenderness and no frontal sinus tenderness.  Mouth/Throat: Uvula is midline, oropharynx is clear and moist and mucous membranes are normal.  Lips are normal. Tongue is normal.   Eyes: Conjunctivae and EOM are normal. No scleral icterus.  Neck: Neck supple. No JVD present. No tracheal deviation present.  Cardiovascular: Normal rate and regular rhythm.  Pulmonary/Chest: Effort normal and breath sounds normal. No stridor. No respiratory distress. She has no wheezes. She has no rales.  Musculoskeletal: She exhibits no edema.  Gait normal.   Lymphadenopathy:    She has no cervical adenopathy.  Neurological: She is alert and oriented to person, place, and time. No cranial nerve deficit.  Skin: Skin is warm and dry. Capillary refill takes less than 2 seconds. She  is not diaphoretic. No pallor.  Facial skin does feel dry. Some minor swelling seen under both eyes. Faint redness of both cheeks of face with dry skin.   Psychiatric: She has a normal mood and affect. Her behavior is normal.  Nursing note and vitals reviewed.  Vitals:   05/19/18 1319 05/19/18 1335    BP: (!) 160/90 (!) 154/88  Pulse: 81   Temp: 98.3 F (36.8 C)   SpO2: 98%       Assessment & Plan:    Rash/nonspecific skin eruption - we will do triamcinolone ointment, patient advised to apply thin layer to skin on face twice a day.  If rash does not improve over the course the next week, we discussed possibility of a referral to dermatology.  Advised to monitor for any things in her environment or products she uses on face that could possibly be culprit of skin irritation.  Essential hypertension- due to patient's recent elevated BP readings, we will trial increasing amlodipine from 2.5 mg daily to 5 mg daily.  Patient will return to clinic in 2 weeks for recheck of blood pressure with nurse.  CBC and CMP collected in lab today.  If BP is improved with increased amlodipine dosing, we will plan to stay on that dose.  Patient advised that she can return to clinic sooner than the 2-week mark if any issues arise or if current symptoms persist or worsen.

## 2018-05-19 NOTE — Telephone Encounter (Signed)
Pt reports facial "Puffiness" and rash x 1 week, worsening this past weekend. States mild swelling around eyes, worse under eyes, corners of mouth. Also reports generalized blotchy rash "Different places around my face." States had been itchy, "Much better today" denies pain, tenderness. Denies any dietary or environmental changes. States no new medications or lotions, cosmetics. No fever. Reports had "Something similar 2-3 years ago."  Saw dermatologist but unsure of cause. Also reports mild dizziness, nausea over weekend, not presently. Appt made for today with L. Guse; care advise given per protocol.  Reason for Disposition . [1] MILD eyelid swelling (puffiness) AND [2] sinus pain or pressure    No tenderness but rash present  Answer Assessment - Initial Assessment Questions 1. ONSET: "When did the swelling start?" (e.g., minutes, hours, days)     1 week ago, worsening over weekend 2. LOCATION: "What part of the eyelids is swollen?"     Around and under eyes, corners of mouth 3. SEVERITY: "How swollen is it?"     "Puffy" 4. ITCHING: "Is there any itching?" If so, ask: "How much?"   (Scale 1-10; mild, moderate or severe)     Was earlier, "Much better." 5. PAIN: "Is the swelling painful to touch?" If so, ask: "How painful is it?"   (Scale 1-10; mild, moderate or severe)     No but any type of lotion burns 6. FEVER: "Do you have a fever?" If so, ask: "What is it, how was it measured, and when did it start?"      no 7. CAUSE: "What do you think is causing the swelling?"     Unsure, no dietary or environmental changes 8. RECURRENT SYMPTOM: "Have you had eyelid swelling before?" If so, ask: "When was the last time?" "What happened that time?"     9. OTHER SYMPTOMS: "Do you have any other symptoms?" (e.g., blurred vision, eye discharge, rash, runny nose)     RAsh  Protocols used: EYE - [facility]

## 2018-05-20 DIAGNOSIS — M1611 Unilateral primary osteoarthritis, right hip: Secondary | ICD-10-CM | POA: Diagnosis not present

## 2018-05-25 ENCOUNTER — Telehealth: Payer: Self-pay | Admitting: Internal Medicine

## 2018-05-25 NOTE — Telephone Encounter (Signed)
Preoperative clearance request received.  Needs to be seen, since last visit was June

## 2018-05-26 NOTE — Telephone Encounter (Signed)
Spoke with pt and scheduled her an appt for 06/27/2018 @ 11:30am. Pt is aware of appt date and time.   Form is in red folder.

## 2018-05-28 ENCOUNTER — Ambulatory Visit: Payer: Self-pay | Admitting: Orthopedic Surgery

## 2018-06-02 ENCOUNTER — Ambulatory Visit (INDEPENDENT_AMBULATORY_CARE_PROVIDER_SITE_OTHER): Payer: PPO | Admitting: *Deleted

## 2018-06-02 VITALS — BP 138/70 | HR 68 | Resp 16

## 2018-06-02 DIAGNOSIS — Z23 Encounter for immunization: Secondary | ICD-10-CM | POA: Diagnosis not present

## 2018-06-02 DIAGNOSIS — I10 Essential (primary) hypertension: Secondary | ICD-10-CM

## 2018-06-02 NOTE — Progress Notes (Signed)
Patient here for nurse visit BP check per order from 05/19/18  Patient reports compliance with prescribed BP medications: yes  Last dose of BP medication:   BP Readings from Last 3 Encounters:  06/02/18 140/70  05/19/18 (!) 154/88  12/10/17 140/82   Pulse Readings from Last 3 Encounters:  05/19/18 81  12/10/17 60  12/06/16 70      Henrene PastorKathy Davis, LPN

## 2018-06-03 NOTE — Progress Notes (Signed)
BP improved from last visit

## 2018-06-08 ENCOUNTER — Other Ambulatory Visit: Payer: Self-pay | Admitting: Family Medicine

## 2018-06-08 DIAGNOSIS — I10 Essential (primary) hypertension: Secondary | ICD-10-CM

## 2018-06-16 ENCOUNTER — Telehealth: Payer: Self-pay | Admitting: Internal Medicine

## 2018-06-16 ENCOUNTER — Other Ambulatory Visit: Payer: Self-pay

## 2018-06-16 MED ORDER — CELECOXIB 200 MG PO CAPS: 200.0000 mg | ORAL_CAPSULE | Freq: Two times a day (BID) | ORAL | 0 refills | Status: DC

## 2018-06-16 NOTE — Telephone Encounter (Signed)
Yes she can resume once daily celebrex.  She can also take  up to 2000 mg of acetominophen (tylenol) every day safely  In divided doses (500 mg every 6 hours  Or 1000 mg every 12 hours.)

## 2018-06-16 NOTE — Telephone Encounter (Signed)
Copied from CRM [id number]. Topic: Quick Communication - See Telephone Encounter >> Jun 16, 2018 10:20 AM Windy KalataMichael, Giovonni Poirier L, NT wrote: CRM for notification. See Telephone encounter for: 06/16/18.  Patient is calling and states that her hip replacement surgery has been moved to 08/13/18. She states she would like to know if she can go back on celecoxib (CELEBREX) 200 MG capsule until her appointment. She states the prescription was wrote to take 2 a day but she was told by Dr. Darrick Huntsmanullo to just take 1. She would like a call back with advice.

## 2018-06-16 NOTE — Telephone Encounter (Signed)
Please advise 

## 2018-06-16 NOTE — Telephone Encounter (Signed)
I have spoken with patient & sent her prescription of celebrex to [facility].

## 2018-06-27 ENCOUNTER — Ambulatory Visit: Payer: PPO | Admitting: Internal Medicine

## 2018-07-09 ENCOUNTER — Other Ambulatory Visit: Payer: Self-pay | Admitting: Internal Medicine

## 2018-07-09 DIAGNOSIS — I10 Essential (primary) hypertension: Secondary | ICD-10-CM

## 2018-07-17 ENCOUNTER — Other Ambulatory Visit: Payer: Self-pay | Admitting: Internal Medicine

## 2018-07-17 ENCOUNTER — Other Ambulatory Visit: Payer: Self-pay

## 2018-07-17 DIAGNOSIS — M25651 Stiffness of right hip, not elsewhere classified: Secondary | ICD-10-CM | POA: Diagnosis not present

## 2018-07-17 DIAGNOSIS — Z01818 Encounter for other preprocedural examination: Secondary | ICD-10-CM | POA: Diagnosis not present

## 2018-07-17 DIAGNOSIS — M1611 Unilateral primary osteoarthritis, right hip: Secondary | ICD-10-CM | POA: Diagnosis not present

## 2018-07-17 DIAGNOSIS — M25551 Pain in right hip: Secondary | ICD-10-CM | POA: Diagnosis not present

## 2018-07-17 MED ORDER — CELECOXIB 200 MG PO CAPS: 200.0000 mg | ORAL_CAPSULE | Freq: Two times a day (BID) | ORAL | 1 refills | Status: DC

## 2018-07-28 ENCOUNTER — Ambulatory Visit (INDEPENDENT_AMBULATORY_CARE_PROVIDER_SITE_OTHER): Payer: PPO | Admitting: Internal Medicine

## 2018-07-28 ENCOUNTER — Ambulatory Visit (INDEPENDENT_AMBULATORY_CARE_PROVIDER_SITE_OTHER): Payer: PPO

## 2018-07-28 ENCOUNTER — Encounter: Payer: Self-pay | Admitting: Internal Medicine

## 2018-07-28 VITALS — BP 138/72 | HR 85 | Temp 98.2°F | Resp 16 | Ht 61.0 in | Wt 160.6 lb

## 2018-07-28 DIAGNOSIS — Z01818 Encounter for other preprocedural examination: Secondary | ICD-10-CM | POA: Diagnosis not present

## 2018-07-28 DIAGNOSIS — N183 Chronic kidney disease, stage 3 unspecified: Secondary | ICD-10-CM

## 2018-07-28 DIAGNOSIS — M25551 Pain in right hip: Secondary | ICD-10-CM | POA: Diagnosis not present

## 2018-07-28 DIAGNOSIS — G8929 Other chronic pain: Secondary | ICD-10-CM | POA: Diagnosis not present

## 2018-07-28 DIAGNOSIS — Z01811 Encounter for preprocedural respiratory examination: Secondary | ICD-10-CM | POA: Diagnosis not present

## 2018-07-28 LAB — COMPREHENSIVE METABOLIC PANEL
ALT: 15 U/L (ref 0–35)
AST: 27 U/L (ref 0–37)
Albumin: 4.3 g/dL (ref 3.5–5.2)
Alkaline Phosphatase: 63 U/L (ref 39–117)
BUN: 18 mg/dL (ref 6–23)
CO2: 29 mEq/L (ref 19–32)
CREATININE: 1.13 mg/dL (ref 0.40–1.20)
Calcium: 9.8 mg/dL (ref 8.4–10.5)
Chloride: 99 mEq/L (ref 96–112)
GFR: 46.72 mL/min — AB (ref >60.00)
Glucose, Bld: 105 mg/dL — ABNORMAL HIGH (ref 70–99)
Potassium: 3.7 mEq/L (ref 3.5–5.1)
Sodium: 137 mEq/L (ref 135–145)
Total Bilirubin: 0.4 mg/dL (ref 0.2–1.2)
Total Protein: 7.5 g/dL (ref 6.0–8.3)

## 2018-07-28 LAB — MAGNESIUM: Magnesium: 1.9 mg/dL (ref 1.5–2.5)

## 2018-07-28 MED ORDER — TRAMADOL HCL 50 MG PO TABS: 50.0000 mg | ORAL_TABLET | Freq: Three times a day (TID) | ORAL | 0 refills | Status: DC | PRN

## 2018-07-28 NOTE — Patient Instructions (Signed)
I am prescribing tramadol for you to try BEFORE your surgery so we know if you tolerate it  Continue Celebrex.    You can take up to 2000 mg of acetominophen (tylenol) every day safely  In divided doses (500 mg every 6 hours  Or 1000 mg every 12 hours.)

## 2018-07-28 NOTE — Progress Notes (Signed)
Subjective:  Patient ID: Mary Vasquez, female    DOB: August 04, 1940  Age: 78 y.o. MRN: [id number]  CC: The primary encounter diagnosis was Preoperative evaluation to rule out surgical contraindication. Diagnoses of Chronic hip pain, right, CKD (chronic kidney disease) stage 3, GFR 30-59 ml/min (HCC), and Preoperative evaluation of a medical condition to rule out surgical contraindications (TAR required) were also pertinent to this visit.  HPI Mary Vasquez presents for Preoperative medical clearance, requested by her orthopedist, Cassell SmilesJames Bowers,  for elective THA right hip to [facility] under general anesthesia  [facility] Aug 13 2018 .  No rehab stay is planned.  ..   She lives in a two story  townhouse  that she is currently managing with a cane and ascension leading with the left foot.  She is anxious to have the surgery and whises she had done it a year ago. She lives alone but her daughter will be staying with her after the surgery.  She is taking celebrex once daily and tylenol 1000 mg daily for pain control .  No history of tramadol use.  Gets nauseated with Vicodin .  dicussed trial of tramadol preoperatively .  She has a history of essential hypertension and hypothyroidism.  She denies any recent episodes of chest pain , cough or shortness of breath.  She has no history of DM oor anemia.  She has mild stable CKD with GFR  Of  50 ml/min .  Marland Kitchen.   Her brother , who is [id number]  Is currently hospitallized with 3 vessel CAD awaiting surgery. He has a history of Type 2 DM.        Outpatient Medications Prior to Visit  Medication Sig Dispense Refill  . acetaminophen (TYLENOL) 500 MG tablet Take 1,000 mg by mouth every 6 (six) hours as needed for moderate pain or headache.    Marland Kitchen. amLODipine (NORVASC) 5 MG tablet TAKE 1 TABLET BY MOUTH EVERY DAY (Patient taking differently: Take 5 mg by mouth daily. ) 30 tablet 0  . celecoxib (CELEBREX) 200 MG capsule Take 1 capsule (200 mg total) by mouth 2 (two) times  daily. (Patient taking differently: Take 200 mg by mouth daily. ) 180 capsule 1  . Cholecalciferol (VITAMIN D3) 25 MCG (1000 UT) CAPS Take 1,000 Units by mouth 2 (two) times daily.    Marland Kitchen. levothyroxine (SYNTHROID, LEVOTHROID) 25 MCG tablet TAKE 1 TABLET BY MOUTH DAILY BEFORE BREAKFAST EXCEPT ON SUNDAY AND THURSDAY TAKE 2 TABS (Patient taking differently: Take 25-50 mcg by mouth See admin instructions. Take 50 mcg by mouth daily on Sunday and Thursday. Take 25 mcg by mouth daily on all other days) 114 tablet 1  . Red Yeast Rice 600 MG CAPS Take 1 capsule (600 mg total) by mouth 2 (two) times daily. 180 capsule 1  . TURMERIC PO Take 1 capsule by mouth daily.      No facility-administered medications prior to visit.     Review of Systems;  Patient denies headache, fevers, malaise, unintentional weight loss, skin rash, eye pain, sinus congestion and sinus pain, sore throat, dysphagia,  hemoptysis , cough, dyspnea, wheezing, chest pain, palpitations, orthopnea, edema, abdominal pain, nausea, melena, diarrhea, constipation, flank pain, dysuria, hematuria, urinary  Frequency, nocturia, numbness, tingling, seizures,  Focal weakness, Loss of consciousness,  Tremor, insomnia, depression, anxiety, and suicidal ideation.      Objective:  BP 138/72 (BP Location: Left Arm, Patient Position: Sitting, Cuff Size: Normal)   Pulse 85  Temp 98.2 F (36.8 C) (Oral)   Resp 16   Ht 5\' 1"  (1.549 m)   Wt 160 lb 9.6 oz (72.8 kg)   SpO2 97%   BMI 30.35 kg/m   BP Readings from Last 3 Encounters:  07/28/18 138/72  06/02/18 138/70  05/19/18 (!) 154/88    Wt Readings from Last 3 Encounters:  07/28/18 160 lb 9.6 oz (72.8 kg)  05/19/18 160 lb 12.8 oz (72.9 kg)  12/10/17 164 lb 6.4 oz (74.6 kg)    General appearance: alert, cooperative and appears stated age Ears: normal TM's and external ear canals both ears Throat: lips, mucosa, and tongue normal; teeth and gums normal Neck: no adenopathy, no carotid bruit,  supple, symmetrical, trachea midline and thyroid not enlarged, symmetric, no tenderness/mass/nodules Back: symmetric, no curvature. ROM normal. No CVA tenderness. Lungs: clear to auscultation bilaterally Heart: regular rate and rhythm, S1, S2 normal, no murmur, click, rub or gallop Abdomen: soft, non-tender; bowel sounds normal; no masses,  no organomegaly Pulses: 2+ and symmetric Skin: Skin color, texture, turgor normal. No rashes or lesions Lymph nodes: Cervical, supraclavicular, and axillary nodes normal.  Lab Results  Component Value Date   HGBA1C 5.7 12/06/2017   HGBA1C 5.8 01/24/2017   HGBA1C 5.8 09/09/2015    Lab Results  Component Value Date   CREATININE 1.13 07/28/2018   CREATININE 1.22 (H) 05/19/2018   CREATININE 1.09 12/06/2017    Lab Results  Component Value Date   WBC 6.9 05/19/2018   HGB 13.4 05/19/2018   HCT 38.8 05/19/2018   PLT 252.0 05/19/2018   GLUCOSE 105 (H) 07/28/2018   CHOL 230 (H) 03/12/2018   TRIG 118.0 03/12/2018   HDL 63.70 03/12/2018   LDLDIRECT 91.0 09/09/2015   LDLCALC 143 (H) 03/12/2018   ALT 15 07/28/2018   AST 27 07/28/2018   NA 137 07/28/2018   K 3.7 07/28/2018   CL 99 07/28/2018   CREATININE 1.13 07/28/2018   BUN 18 07/28/2018   CO2 29 07/28/2018   TSH 2.80 12/06/2017   HGBA1C 5.7 12/06/2017   MICROALBUR <0.7 12/10/2017    Mm 3d Screen Breast Bilateral  Result Date: 01/03/2018 CLINICAL DATA:  Screening. EXAM: DIGITAL SCREENING BILATERAL MAMMOGRAM WITH TOMO AND CAD COMPARISON:  Previous exam(s). ACR Breast Density Category c: The breast tissue is heterogeneously dense, which may obscure small masses. FINDINGS: There are no findings suspicious for malignancy. Images were processed with CAD. IMPRESSION: No mammographic evidence of malignancy. A result letter of this screening mammogram will be mailed directly to the patient. RECOMMENDATION: Screening mammogram in one year. (Code:SM-B-01Y) BI-RADS CATEGORY  1: Negative. Electronically  Signed   By: Sherian ReinWei-Chen  Lin M.D.   On: 01/03/2018 13:21    Assessment & Plan:   Problem List Items Addressed This Visit    Chronic hip pain, right    Secondary to advanced DJD.  She has persistent activity limiting pain despite daily use of celebrex and tylenol.    Preoperative assessment done today for elective THR planned for Feb 5  Has been done. Adding 2nd dose of tylenol and prn tramadol for pain management       Relevant Medications   traMADol (ULTRAM) 50 MG tablet   CKD (chronic kidney disease) stage 3, GFR 30-59 ml/min (HCC)    Noted on today's screening labs.  Will suspend celebrex completely and have her use tylenol and tramadol      Preoperative evaluation of a medical condition to rule out surgical contraindications (TAR required)  I have reviewed her EKG from today and see no acute changes.  Patient  is considered to be at low risk  For perioperative complications  Based on today's exam and history.  Baseline lytes,  hgb and ekg done.       Other Visit Diagnoses    Preoperative evaluation to rule out surgical contraindication    -  Primary   Relevant Orders   EKG 12-Lead (Completed)   DG Chest 2 View (Completed)   Comprehensive metabolic panel (Completed)   Magnesium (Completed)      I am having Mary Vasquez start on traMADol. I am also having her maintain her Red Yeast Rice, TURMERIC PO, levothyroxine, amLODipine, celecoxib, Vitamin D3, and acetaminophen.  Meds ordered this encounter  Medications  . traMADol (ULTRAM) 50 MG tablet    Sig: Take 1 tablet (50 mg total) by mouth every 8 (eight) hours as needed.    Dispense:  30 tablet    Refill:  0    There are no discontinued medications.  Follow-up: No follow-ups on file.   Sherlene Shams, MD

## 2018-07-29 ENCOUNTER — Encounter: Payer: Self-pay | Admitting: Internal Medicine

## 2018-07-29 DIAGNOSIS — N183 Chronic kidney disease, stage 3 unspecified: Secondary | ICD-10-CM | POA: Insufficient documentation

## 2018-07-29 NOTE — Assessment & Plan Note (Signed)
Secondary to advanced DJD.  She has persistent activity limiting pain despite daily use of celebrex and tylenol.    Preoperative assessment done today for elective THR planned for Feb 5  Has been done. Adding 2nd dose of tylenol and prn tramadol for pain management

## 2018-07-29 NOTE — Assessment & Plan Note (Signed)
I have reviewed her EKG from today and see no acute changes.  Patient  is considered to be at low risk  For perioperative complications  Based on today's exam and history.  Baseline lytes,  hgb and ekg done.

## 2018-07-29 NOTE — Assessment & Plan Note (Signed)
Noted on today's screening labs.  Will suspend celebrex completely and have her use tylenol and tramadol

## 2018-07-31 ENCOUNTER — Encounter
Admission: RE | Admit: 2018-07-31 | Discharge: 2018-07-31 | Disposition: A | Discharge status: Home / Self Care | Payer: PPO | Source: Ambulatory Visit | Attending: Orthopedic Surgery | Admitting: Orthopedic Surgery

## 2018-07-31 ENCOUNTER — Other Ambulatory Visit: Payer: Self-pay

## 2018-07-31 DIAGNOSIS — Z01812 Encounter for preprocedural laboratory examination: Secondary | ICD-10-CM | POA: Diagnosis not present

## 2018-07-31 HISTORY — DX: Major depressive disorder, single episode, unspecified: F32.9

## 2018-07-31 HISTORY — DX: Unspecified osteoarthritis, unspecified site: M19.90

## 2018-07-31 HISTORY — DX: Depression, unspecified: F32.A

## 2018-07-31 HISTORY — DX: Personal history of urinary calculi: Z87.442

## 2018-07-31 HISTORY — DX: Hypothyroidism, unspecified: E03.9

## 2018-07-31 LAB — CBC
HCT: 40.1 % (ref 36.0–46.0)
Hemoglobin: 13 g/dL (ref 12.0–15.0)
MCH: 31.2 pg (ref 26.0–34.0)
MCHC: 32.4 g/dL (ref 30.0–36.0)
MCV: 96.2 fL (ref 80.0–100.0)
Platelets: 239 K/uL (ref 150–400)
RBC: 4.17 MIL/uL (ref 3.87–5.11)
RDW: 13.2 % (ref 11.5–15.5)
WBC: 6.9 K/uL (ref 4.0–10.5)
nRBC: 0 % (ref 0.0–0.2)

## 2018-07-31 LAB — URINALYSIS, ROUTINE W REFLEX MICROSCOPIC
Bilirubin Urine: NEGATIVE
Glucose, UA: NEGATIVE mg/dL
Hgb urine dipstick: NEGATIVE
Ketones, ur: NEGATIVE mg/dL
Leukocytes, UA: NEGATIVE
Nitrite: NEGATIVE
Protein, ur: NEGATIVE mg/dL
Specific Gravity, Urine: 1.008 (ref 1.005–1.030)
pH: 5 (ref 5.0–8.0)

## 2018-07-31 LAB — PROTIME-INR
INR: 1.11
Prothrombin Time: 14.2 s (ref 11.4–15.2)

## 2018-07-31 LAB — APTT: aPTT: 30 seconds (ref 24–36)

## 2018-07-31 LAB — SURGICAL PCR SCREEN
MRSA, PCR: NEGATIVE
Staphylococcus aureus: NEGATIVE

## 2018-07-31 LAB — TYPE AND SCREEN
ABO/RH(D): A POS
Antibody Screen: NEGATIVE

## 2018-07-31 NOTE — Patient Instructions (Addendum)
Your procedure is scheduled on: Wednesday, August 13, 2018  Report to THE SECOND FLOOR OF THE MEDICAL MALL    DO NOT STOP ON THE FIRST FLOOR TO REGISTER . To find out your arrival time please call [phone] between 1PM - 3PM on Tuesday, August 12, 2018  Remember: Instructions that are not followed completely may result in serious medical risk,  up to and including death, or upon the discretion of your surgeon and anesthesiologist your  surgery may need to be rescheduled.     _X__ 1. Do not eat food after midnight the night before your procedure.                 No gum chewing or hard candies.                      ABSOLUTELY NOTHING SOLID IN YOUR MOUTH AFTER MIDNIGHT                 You may drink clear liquids up to 2 hours before you are scheduled to arrive for your surgery-                  DO not drink clear liquids within 2 hours of the start of your surgery.                  Clear Liquids include:  water, apple juice without pulp, clear carbohydrate                 drink such as Clearfast of Gatorade, Black Coffee or Tea (Do not add                 anything to coffee or tea).  __X__2.  On the morning of surgery brush your teeth with toothpaste and water,                   You may rinse your mouth with mouthwash if you wish.                       Do not swallow any toothpaste of mouthwash.     _X__ 3.  No Alcohol for 24 hours before or after surgery.   _X__ 4.  Do Not Smoke or use e-cigarettes For 24 Hours Prior to Your Surgery.                 Do not use any chewable tobacco products for at least 6 hours prior to                 surgery.  ____  5.  Bring all medications with you on the day of surgery if instructed.   _X___  6.  Notify your doctor if there is any change in your medical condition      (cold, fever, infections).     Do not wear jewelry, make-up, hairpins, clips or nail polish. Do not wear lotions, powders, or perfumes. You may  wear deodorant. Do not shave 48 hours prior to surgery. Men may shave face and neck. Do not bring valuables to the hospital.    [facility] is not responsible for any belongings or valuables.  Contacts, dentures or bridgework may not be worn into surgery. Leave your suitcase in the car. After surgery it may be brought to your room. For patients admitted to the hospital, discharge time is determined by your treatment team.   Patients discharged the  day of surgery will not be allowed to drive home.   Please read over the following fact sheets that you were given:   PREPARING FOR SURGERY                    MRSA: STOP THE SPREAD               ADVANCE DIRECTIVES  __X__ Take these medicines the morning of surgery with A SIP OF WATER:    1.NORVASC/ AMLODIPINE  2. LEVOTHYROXINE/ SYNTHROID  3.   4.  5.  6.  ____ Fleet Enema (as directed)   __X__ Use CHG Soap as directed  ____ Use inhalers on the day of surgery  __X__ Stop ALL ASPIRIN PRODUCTS AS OF August 06, 2018  __X__ Stop Anti-inflammatories AS OF August 06, 2018              THIS INCLUDES CELEBREX   ___X_ Stop supplements until after surgery.                   THIS INCLUDES TURMERIC / RED RICE YEAST              YOU MAY CONTINUE TAKING VITAMIN D3, BUT DO NOT TAKE ON                   THE DAY OF SURGERY  ____ Bring C-Pap to the hospital.    WEAR COMFORTABLE CLOTHES TO THE HOSPITAL.   HAVE STURDY SHOES THAT ARE EASY TO GET IN AND OUT OF  HAVE STOOL SOFTENERS AT HOME FOR AFTER SURGERY  YOU MIGHT WANT TO CONSIDER TAKING THEM A DAY OR TWO PRIOR TO SURGERY  YOU WILL BE SCHEDULED FOR THE JOINT CLASS ON August 06, 2018    IT IS ON THE LOWER LEVEL OF THE MEDICAL ARTS BLDG IN ROOM 101             10:30 AM TO 12:30 PM

## 2018-08-01 ENCOUNTER — Other Ambulatory Visit: Payer: Self-pay | Admitting: Internal Medicine

## 2018-08-05 ENCOUNTER — Other Ambulatory Visit: Payer: Self-pay | Admitting: Internal Medicine

## 2018-08-05 DIAGNOSIS — I10 Essential (primary) hypertension: Secondary | ICD-10-CM

## 2018-08-12 MED ORDER — TRANEXAMIC ACID-NACL 1000-0.7 MG/100ML-% IV SOLN
1000.0000 mg | INTRAVENOUS | Status: AC
  Administered 2018-08-13: 1000 mg via INTRAVENOUS
  Filled 2018-08-12: qty 100

## 2018-08-12 MED ORDER — CLINDAMYCIN PHOSPHATE 900 MG/50ML IV SOLN
900.0000 mg | INTRAVENOUS | Status: AC
  Administered 2018-08-13: 900 mg via INTRAVENOUS

## 2018-08-13 ENCOUNTER — Other Ambulatory Visit: Payer: Self-pay

## 2018-08-13 ENCOUNTER — Encounter
Admission: RE | Disposition: A | Discharge status: Home Health | Payer: Self-pay | Source: Home / Self Care | Attending: Orthopedic Surgery

## 2018-08-13 ENCOUNTER — Encounter: Payer: Self-pay | Admitting: *Deleted

## 2018-08-13 ENCOUNTER — Inpatient Hospital Stay
Admission: RE | Admit: 2018-08-13 | Discharge: 2018-08-15 | DRG: 470 | Disposition: A | Discharge status: Home Health | Payer: PPO | Attending: Orthopedic Surgery | Admitting: Orthopedic Surgery

## 2018-08-13 ENCOUNTER — Inpatient Hospital Stay: Payer: PPO | Admitting: Certified Registered"

## 2018-08-13 ENCOUNTER — Inpatient Hospital Stay: Payer: PPO

## 2018-08-13 DIAGNOSIS — Z96649 Presence of unspecified artificial hip joint: Secondary | ICD-10-CM

## 2018-08-13 DIAGNOSIS — E785 Hyperlipidemia, unspecified: Secondary | ICD-10-CM | POA: Diagnosis present

## 2018-08-13 DIAGNOSIS — Z880 Allergy status to penicillin: Secondary | ICD-10-CM | POA: Diagnosis not present

## 2018-08-13 DIAGNOSIS — Z09 Encounter for follow-up examination after completed treatment for conditions other than malignant neoplasm: Secondary | ICD-10-CM

## 2018-08-13 DIAGNOSIS — Z471 Aftercare following joint replacement surgery: Secondary | ICD-10-CM | POA: Diagnosis not present

## 2018-08-13 DIAGNOSIS — Z9660 Presence of unspecified orthopedic joint implant: Secondary | ICD-10-CM

## 2018-08-13 DIAGNOSIS — N183 Chronic kidney disease, stage 3 (moderate): Secondary | ICD-10-CM | POA: Diagnosis not present

## 2018-08-13 DIAGNOSIS — Z9181 History of falling: Secondary | ICD-10-CM | POA: Diagnosis not present

## 2018-08-13 DIAGNOSIS — M1611 Unilateral primary osteoarthritis, right hip: Secondary | ICD-10-CM | POA: Diagnosis not present

## 2018-08-13 DIAGNOSIS — I10 Essential (primary) hypertension: Secondary | ICD-10-CM | POA: Diagnosis not present

## 2018-08-13 DIAGNOSIS — E039 Hypothyroidism, unspecified: Secondary | ICD-10-CM | POA: Diagnosis not present

## 2018-08-13 DIAGNOSIS — R32 Unspecified urinary incontinence: Secondary | ICD-10-CM | POA: Diagnosis not present

## 2018-08-13 DIAGNOSIS — Z96641 Presence of right artificial hip joint: Secondary | ICD-10-CM | POA: Diagnosis not present

## 2018-08-13 DIAGNOSIS — I129 Hypertensive chronic kidney disease with stage 1 through stage 4 chronic kidney disease, or unspecified chronic kidney disease: Secondary | ICD-10-CM | POA: Diagnosis not present

## 2018-08-13 HISTORY — PX: TOTAL HIP ARTHROPLASTY: SHX124

## 2018-08-13 LAB — ABO/RH: ABO/RH(D): A POS

## 2018-08-13 SURGERY — ARTHROPLASTY, HIP, TOTAL, ANTERIOR APPROACH
Anesthesia: Spinal | Laterality: Right

## 2018-08-13 MED ORDER — FENTANYL CITRATE (PF) 100 MCG/2ML IJ SOLN
INTRAMUSCULAR | Status: DC | PRN
  Administered 2018-08-13: 50 ug via INTRAVENOUS

## 2018-08-13 MED ORDER — ACETAMINOPHEN 500 MG PO TABS
1000.0000 mg | ORAL_TABLET | Freq: Once | ORAL | Status: AC
  Administered 2018-08-13: 1000 mg via ORAL

## 2018-08-13 MED ORDER — FENTANYL CITRATE (PF) 100 MCG/2ML IJ SOLN
25.0000 ug | INTRAMUSCULAR | Status: DC | PRN
  Administered 2018-08-13 (×2): 25 ug via INTRAVENOUS

## 2018-08-13 MED ORDER — CHLORHEXIDINE GLUCONATE 4 % EX LIQD: 60.0000 mL | Freq: Once | CUTANEOUS | Status: DC

## 2018-08-13 MED ORDER — MAGNESIUM HYDROXIDE 400 MG/5ML PO SUSP
30.0000 mL | Freq: Every day | ORAL | Status: DC | PRN
  Administered 2018-08-14: 30 mL via ORAL
  Filled 2018-08-13: qty 30

## 2018-08-13 MED ORDER — BISACODYL 10 MG RE SUPP: 10.0000 mg | Freq: Every day | RECTAL | Status: DC | PRN

## 2018-08-13 MED ORDER — GABAPENTIN 300 MG PO CAPS
ORAL_CAPSULE | ORAL | Status: AC
  Administered 2018-08-13: 300 mg via ORAL
  Filled 2018-08-13: qty 1

## 2018-08-13 MED ORDER — CLINDAMYCIN PHOSPHATE 600 MG/50ML IV SOLN
600.0000 mg | Freq: Four times a day (QID) | INTRAVENOUS | Status: AC
  Administered 2018-08-13 (×2): 600 mg via INTRAVENOUS
  Filled 2018-08-13 (×2): qty 50

## 2018-08-13 MED ORDER — PROPOFOL 10 MG/ML IV BOLUS
INTRAVENOUS | Status: AC
  Filled 2018-08-13: qty 40

## 2018-08-13 MED ORDER — LEVOTHYROXINE SODIUM 25 MCG PO TABS
25.0000 ug | ORAL_TABLET | Freq: Every day | ORAL | Status: DC
  Administered 2018-08-14 – 2018-08-15 (×2): 25 ug via ORAL
  Filled 2018-08-13 (×2): qty 1

## 2018-08-13 MED ORDER — MENTHOL 3 MG MT LOZG
1.0000 | LOZENGE | OROMUCOSAL | Status: DC | PRN
  Filled 2018-08-13: qty 9

## 2018-08-13 MED ORDER — FENTANYL CITRATE (PF) 100 MCG/2ML IJ SOLN
INTRAMUSCULAR | Status: AC
  Filled 2018-08-13: qty 2

## 2018-08-13 MED ORDER — EPHEDRINE SULFATE 50 MG/ML IJ SOLN
INTRAMUSCULAR | Status: DC | PRN
  Administered 2018-08-13: 10 mg via INTRAVENOUS

## 2018-08-13 MED ORDER — ACETAMINOPHEN 325 MG PO TABS
325.0000 mg | ORAL_TABLET | Freq: Four times a day (QID) | ORAL | Status: DC | PRN
  Administered 2018-08-14: 650 mg via ORAL
  Filled 2018-08-13: qty 2

## 2018-08-13 MED ORDER — LACTATED RINGERS IV SOLN
INTRAVENOUS | Status: DC
  Administered 2018-08-13: 12:00:00 via INTRAVENOUS

## 2018-08-13 MED ORDER — FAMOTIDINE 20 MG PO TABS
ORAL_TABLET | ORAL | Status: AC
  Administered 2018-08-13: 20 mg via ORAL
  Filled 2018-08-13: qty 1

## 2018-08-13 MED ORDER — ACETAMINOPHEN 500 MG PO TABS
ORAL_TABLET | ORAL | Status: AC
  Administered 2018-08-13: 1000 mg via ORAL
  Filled 2018-08-13: qty 2

## 2018-08-13 MED ORDER — PHENOL 1.4 % MT LIQD
1.0000 | OROMUCOSAL | Status: DC | PRN
  Filled 2018-08-13: qty 177

## 2018-08-13 MED ORDER — TRAMADOL HCL 50 MG PO TABS
50.0000 mg | ORAL_TABLET | Freq: Four times a day (QID) | ORAL | Status: DC
  Administered 2018-08-13 – 2018-08-15 (×3): 50 mg via ORAL
  Filled 2018-08-13 (×4): qty 1

## 2018-08-13 MED ORDER — ACETAMINOPHEN 500 MG PO TABS
500.0000 mg | ORAL_TABLET | Freq: Four times a day (QID) | ORAL | Status: AC
  Administered 2018-08-13 (×2): 500 mg via ORAL
  Filled 2018-08-13 (×2): qty 1

## 2018-08-13 MED ORDER — OXYCODONE HCL 5 MG PO TABS: 5.0000 mg | ORAL_TABLET | ORAL | Status: DC | PRN

## 2018-08-13 MED ORDER — CLINDAMYCIN PHOSPHATE 900 MG/50ML IV SOLN
INTRAVENOUS | Status: AC
  Filled 2018-08-13: qty 50

## 2018-08-13 MED ORDER — BUPIVACAINE-EPINEPHRINE (PF) 0.25% -1:200000 IJ SOLN
INTRAMUSCULAR | Status: DC | PRN
  Administered 2018-08-13: 20 mL via PERINEURAL

## 2018-08-13 MED ORDER — LACTATED RINGERS IV SOLN
INTRAVENOUS | Status: DC
  Administered 2018-08-13: 07:00:00 via INTRAVENOUS

## 2018-08-13 MED ORDER — MIDAZOLAM HCL 5 MG/5ML IJ SOLN
INTRAMUSCULAR | Status: DC | PRN
  Administered 2018-08-13: 1 mg via INTRAVENOUS

## 2018-08-13 MED ORDER — METOCLOPRAMIDE HCL 10 MG PO TABS: 5.0000 mg | ORAL_TABLET | Freq: Three times a day (TID) | ORAL | Status: DC | PRN

## 2018-08-13 MED ORDER — FAMOTIDINE 20 MG PO TABS
20.0000 mg | ORAL_TABLET | Freq: Once | ORAL | Status: AC
  Administered 2018-08-13: 20 mg via ORAL

## 2018-08-13 MED ORDER — ASPIRIN 81 MG PO CHEW
81.0000 mg | CHEWABLE_TABLET | Freq: Two times a day (BID) | ORAL | Status: DC
  Administered 2018-08-13 – 2018-08-15 (×4): 81 mg via ORAL
  Filled 2018-08-13 (×4): qty 1

## 2018-08-13 MED ORDER — ONDANSETRON HCL 4 MG PO TABS: 4.0000 mg | ORAL_TABLET | Freq: Four times a day (QID) | ORAL | Status: DC | PRN

## 2018-08-13 MED ORDER — GABAPENTIN 300 MG PO CAPS
300.0000 mg | ORAL_CAPSULE | Freq: Once | ORAL | Status: AC
  Administered 2018-08-13: 300 mg via ORAL

## 2018-08-13 MED ORDER — AMLODIPINE BESYLATE 5 MG PO TABS
5.0000 mg | ORAL_TABLET | Freq: Every day | ORAL | Status: DC
  Filled 2018-08-13 (×2): qty 1

## 2018-08-13 MED ORDER — PROPOFOL 500 MG/50ML IV EMUL
INTRAVENOUS | Status: DC | PRN
  Administered 2018-08-13: 35 ug/kg/min via INTRAVENOUS

## 2018-08-13 MED ORDER — VITAMIN D 25 MCG (1000 UNIT) PO TABS
1000.0000 [IU] | ORAL_TABLET | Freq: Two times a day (BID) | ORAL | Status: DC
  Administered 2018-08-13 – 2018-08-15 (×4): 1000 [IU] via ORAL
  Filled 2018-08-13 (×7): qty 1

## 2018-08-13 MED ORDER — MAGNESIUM CITRATE PO SOLN
1.0000 | Freq: Once | ORAL | Status: DC | PRN
  Filled 2018-08-13: qty 296

## 2018-08-13 MED ORDER — ONDANSETRON HCL 4 MG/2ML IJ SOLN: 4.0000 mg | Freq: Four times a day (QID) | INTRAMUSCULAR | Status: DC | PRN

## 2018-08-13 MED ORDER — ONDANSETRON HCL 4 MG/2ML IJ SOLN: 4.0000 mg | Freq: Once | INTRAMUSCULAR | Status: DC | PRN

## 2018-08-13 MED ORDER — SODIUM CHLORIDE 0.9 % IR SOLN
Status: DC | PRN
  Administered 2018-08-13: 09:00:00

## 2018-08-13 MED ORDER — SODIUM CHLORIDE FLUSH 0.9 % IV SOLN
INTRAVENOUS | Status: AC
  Filled 2018-08-13: qty 20

## 2018-08-13 MED ORDER — BACITRACIN 50000 UNITS IM SOLR
INTRAMUSCULAR | Status: AC
  Filled 2018-08-13: qty 2

## 2018-08-13 MED ORDER — MIDAZOLAM HCL 2 MG/2ML IJ SOLN
INTRAMUSCULAR | Status: AC
  Filled 2018-08-13: qty 2

## 2018-08-13 MED ORDER — METOCLOPRAMIDE HCL 5 MG/ML IJ SOLN: 5.0000 mg | Freq: Three times a day (TID) | INTRAMUSCULAR | Status: DC | PRN

## 2018-08-13 MED ORDER — BUPIVACAINE-EPINEPHRINE (PF) 0.25% -1:200000 IJ SOLN
INTRAMUSCULAR | Status: AC
  Filled 2018-08-13: qty 30

## 2018-08-13 MED ORDER — GABAPENTIN 300 MG PO CAPS
300.0000 mg | ORAL_CAPSULE | Freq: Three times a day (TID) | ORAL | Status: DC
  Administered 2018-08-13 – 2018-08-15 (×6): 300 mg via ORAL
  Filled 2018-08-13 (×6): qty 1

## 2018-08-13 MED ORDER — MORPHINE SULFATE (PF) 4 MG/ML IV SOLN: 0.5000 mg | INTRAVENOUS | Status: DC | PRN

## 2018-08-13 MED ORDER — PHENYLEPHRINE HCL 10 MG/ML IJ SOLN
INTRAMUSCULAR | Status: DC | PRN
  Administered 2018-08-13 (×2): 100 ug via INTRAVENOUS

## 2018-08-13 MED ORDER — KETOROLAC TROMETHAMINE 15 MG/ML IJ SOLN
7.5000 mg | Freq: Four times a day (QID) | INTRAMUSCULAR | Status: AC
  Administered 2018-08-13 (×2): 7.5 mg via INTRAVENOUS
  Filled 2018-08-13 (×2): qty 1

## 2018-08-13 MED ORDER — DOCUSATE SODIUM 100 MG PO CAPS
100.0000 mg | ORAL_CAPSULE | Freq: Two times a day (BID) | ORAL | Status: DC
  Administered 2018-08-13 – 2018-08-14 (×2): 100 mg via ORAL
  Filled 2018-08-13 (×3): qty 1

## 2018-08-13 SURGICAL SUPPLY — 49 items
BLADE SAGITTAL WIDE XTHICK NO (BLADE) ×3 IMPLANT
BRUSH SCRUB EZ  4% CHG (MISCELLANEOUS) ×2
BRUSH SCRUB EZ 4% CHG (MISCELLANEOUS) ×1 IMPLANT
CHLORAPREP W/TINT 26ML (MISCELLANEOUS) ×3 IMPLANT
COVER HOLE (Hips) ×3 IMPLANT
COVER WAND RF STERILE (DRAPES) ×3 IMPLANT
CUP R3 50MM (Hips) ×3 IMPLANT
DRAPE C-ARM 42X72 X-RAY (DRAPES) ×3 IMPLANT
DRAPE SHEET LG 3/4 BI-LAMINATE (DRAPES) ×3 IMPLANT
DRAPE STERI IOBAN 125X83 (DRAPES) ×3 IMPLANT
DRSG AQUACEL AG ADV 3.5X10 (GAUZE/BANDAGES/DRESSINGS) IMPLANT
DRSG AQUACEL AG ADV 3.5X14 (GAUZE/BANDAGES/DRESSINGS) IMPLANT
ELECT BLADE 6.5 EXT (BLADE) ×3 IMPLANT
ELECT REM PT RETURN 9FT ADLT (ELECTROSURGICAL) ×3
ELECTRODE REM PT RTRN 9FT ADLT (ELECTROSURGICAL) ×1 IMPLANT
GAUZE PETRO XEROFOAM 1X8 (MISCELLANEOUS) IMPLANT
GLOVE BIO SURGEON STRL SZ7.5 (GLOVE) ×9 IMPLANT
GLOVE INDICATOR 8.0 STRL GRN (GLOVE) ×12 IMPLANT
GLOVE SURG ORTHO 8.0 STRL STRW (GLOVE) ×3 IMPLANT
GOWN STRL REUS W/ TWL LRG LVL3 (GOWN DISPOSABLE) ×2 IMPLANT
GOWN STRL REUS W/ TWL XL LVL3 (GOWN DISPOSABLE) ×2 IMPLANT
GOWN STRL REUS W/TWL LRG LVL3 (GOWN DISPOSABLE) ×4
GOWN STRL REUS W/TWL XL LVL3 (GOWN DISPOSABLE) ×4
HEAD OXINIUM PLUS 0 32MM (Hips) ×3 IMPLANT
HOOD PEEL AWAY FLYTE STAYCOOL (MISCELLANEOUS) ×12 IMPLANT
IV NS 1000ML (IV SOLUTION) ×2
IV NS 1000ML BAXH (IV SOLUTION) ×1 IMPLANT
KIT PATIENT CARE HANA TABLE (KITS) ×3 IMPLANT
KIT TURNOVER CYSTO (KITS) ×3 IMPLANT
LINER 0 DEG 32X50MM (Hips) ×3 IMPLANT
MAT ABSORB  FLUID 56X50 GRAY (MISCELLANEOUS) ×2
MAT ABSORB FLUID 56X50 GRAY (MISCELLANEOUS) ×1 IMPLANT
NDL SAFETY ECLIPSE 18X1.5 (NEEDLE) ×2 IMPLANT
NEEDLE HYPO 18GX1.5 SHARP (NEEDLE) ×4
NEEDLE HYPO 22GX1.5 SAFETY (NEEDLE) ×3 IMPLANT
NEEDLE SPNL 20GX3.5 QUINCKE YW (NEEDLE) ×3 IMPLANT
PACK HIP PROSTHESIS (MISCELLANEOUS) ×3 IMPLANT
PADDING CAST BLEND 4X4 NS (MISCELLANEOUS) ×6 IMPLANT
PILLOW ABDUCTION MEDIUM (MISCELLANEOUS) ×3 IMPLANT
PULSAVAC PLUS IRRIG FAN TIP (DISPOSABLE) ×3
SCREW 6.5X25MM (Screw) ×3 IMPLANT
STAPLER SKIN PROX 35W (STAPLE) ×3 IMPLANT
STEM STD COLLAR SZ2 POLARSTEM (Stem) ×3 IMPLANT
SUT BONE WAX W31G (SUTURE) IMPLANT
SUT DVC 2 QUILL PDO  T11 36X36 (SUTURE) ×2
SUT DVC 2 QUILL PDO T11 36X36 (SUTURE) ×1 IMPLANT
SUT VIC AB 2-0 CT1 18 (SUTURE) ×3 IMPLANT
SYR 20CC LL (SYRINGE) ×3 IMPLANT
TIP FAN IRRIG PULSAVAC PLUS (DISPOSABLE) ×1 IMPLANT

## 2018-08-13 NOTE — Progress Notes (Signed)
Pt received to room 141 via bed from PACU. Pt alert and oriented. Oriented to room. Dressing right hip dry and intact. Abduction pillow in place. Family at bedside.

## 2018-08-13 NOTE — Anesthesia Preprocedure Evaluation (Signed)
Anesthesia Evaluation  Patient identified by MRN, date of birth, ID band Patient awake    Reviewed: Allergy & Precautions, H&P , NPO status , Patient's Chart, lab work & pertinent test results, reviewed documented beta blocker date and time   Airway Mallampati: II   Neck ROM: full    Dental  (+) Poor Dentition   Pulmonary neg pulmonary ROS,    Pulmonary exam normal        Cardiovascular hypertension, On Medications negative cardio ROS Normal cardiovascular exam Rhythm:regular Rate:Normal     Neuro/Psych PSYCHIATRIC DISORDERS Depression  Neuromuscular disease negative neurological ROS  negative psych ROS   GI/Hepatic negative GI ROS, Neg liver ROS,   Endo/Other  negative endocrine ROSHypothyroidism   Renal/GU Renal diseasenegative Renal ROS  negative genitourinary   Musculoskeletal   Abdominal   Peds  Hematology negative hematology ROS (+)   Anesthesia Other Findings Past Medical History: No date: Arthritis No date: Depression     Comment:  situational No date: History of kidney stones No date: Hyperlipidemia No date: Hypertension No date: Hypothyroidism Past Surgical History: 2009: BREAST BIOPSY; Left     Comment:  CORE - NEG...marker in breast 2009: BREAST SURGERY     Comment:  stereotactic,  left,  benign  2008: HYSTEROSCOPY 2009: ROTATOR CUFF REPAIR; Right     Comment:  Ted Armour   Reproductive/Obstetrics negative OB ROS                             Anesthesia Physical Anesthesia Plan  ASA: III  Anesthesia Plan: Spinal   Post-op Pain Management:    Induction:   PONV Risk Score and Plan:   Airway Management Planned:   Additional Equipment:   Intra-op Plan:   Post-operative Plan:   Informed Consent: I have reviewed the patients History and Physical, chart, labs and discussed the procedure including the risks, benefits and alternatives for the proposed  anesthesia with the patient or authorized representative who has indicated his/her understanding and acceptance.     Dental Advisory Given  Plan Discussed with: CRNA  Anesthesia Plan Comments:         Anesthesia Quick Evaluation

## 2018-08-13 NOTE — Transfer of Care (Signed)
Immediate Anesthesia Transfer of Care Note  Patient: Mary Vasquez  Procedure(s) Performed: TOTAL HIP ARTHROPLASTY ANTERIOR APPROACH (Right )  Patient Location: PACU  Anesthesia Type:Spinal  Level of Consciousness: awake, alert  and oriented  Airway & Oxygen Therapy: Patient Spontanous Breathing and Patient connected to nasal cannula oxygen  Post-op Assessment: Report given to RN and Post -op Vital signs reviewed and stable  Post vital signs: Reviewed and stable  Last Vitals:  Vitals Value Taken Time  BP    Temp    Pulse    Resp    SpO2      Last Pain:  Vitals:   08/13/18 0613  TempSrc: Tympanic  PainSc: 0-No pain         Complications: No apparent anesthesia complications

## 2018-08-13 NOTE — H&P (Signed)
The patient has been re-examined, and the chart reviewed, and there have been no interval changes to the documented history and physical.  Plan a right anterior hip replacement today.  Anesthesia is not consulted regarding a peripheral nerve block for post-operative pain.  The risks, benefits, and alternatives have been discussed at length, and the patient is willing to proceed.     

## 2018-08-13 NOTE — Evaluation (Signed)
Physical Therapy Evaluation Patient Details Name: Mary Vasquez MRN: [id number] DOB: 1941/06/13 Today's Date: 08/13/2018   History of Present Illness  Pt is a 78 y/o F s/p R THA.   Clinical Impression  Pt is s/p THA resulting in the deficits listed below (see PT Problem List). Mary Vasquez was limited due to dizziness in standing with BP dropping, thus was unable to attempt ambulation.  Pt required cues for safety and sequencing for bed mobility and sit<>stand.  She was able to perform marching and weight shifting at bedside before onset of dizziness.  Pt lives alone and will have intermittent assist available after 3 days at home (daughter and friends to provide 24/7 assist for 2/7-2/9).  Given pt's current mobility status, recommending SNF at d/c.    Pt will benefit from skilled PT to increase their independence and safety with mobility to allow discharge to the venue listed below.     Follow Up Recommendations SNF    Equipment Recommendations  Other (comment)(shower seat)    Recommendations for Other Services       Precautions / Restrictions Precautions Precautions: Fall Precaution Comments: direct anterior, no hip precautions Restrictions Weight Bearing Restrictions: Yes RLE Weight Bearing: Weight bearing as tolerated      Mobility  Bed Mobility Overal bed mobility: Needs Assistance Bed Mobility: Supine to Sit;Sit to Supine     Supine to sit: Min guard;HOB elevated Sit to supine: Min guard   General bed mobility comments: Cues for sequencing.  Pt requires use of bed rail with increased time and effort.  Again, cues for sequencing for sit>supine.   Transfers Overall transfer level: Needs assistance Equipment used: Rolling walker (2 wheeled) Transfers: Sit to/from Stand Sit to Stand: Min guard         General transfer comment: Cues for proper hand placement.  Poorly controlled descent to sit.    Ambulation/Gait             General Gait Details: Not safe to  attempt at this time due to dizziness and diaphoresis with BP drop in standing.   Stairs            Wheelchair Mobility    Modified Rankin (Stroke Patients Only)       Balance Overall balance assessment: Needs assistance Sitting-balance support: No upper extremity supported;Feet supported Sitting balance-Leahy Scale: Good     Standing balance support: Bilateral upper extremity supported;During functional activity Standing balance-Leahy Scale: Poor Standing balance comment: Pt relies on BUE support from RW for static standing and marching activities                             Pertinent Vitals/Pain Pain Assessment: Faces Faces Pain Scale: Hurts little more Pain Location: R hip Pain Descriptors / Indicators: Sore;Guarding Pain Intervention(s): Limited activity within patient's tolerance;Monitored during session;Repositioned;Ice applied;Utilized relaxation techniques    Home Living Family/patient expects to be discharged to:: Private residence Living Arrangements: Alone Available Help at Discharge: Family;Available PRN/intermittently Type of Home: House(townhouse) Home Access: Stairs to enter Entrance Stairs-Rails: Left;Right;Can reach both Entrance Stairs-Number of Steps: 5 Home Layout: Two level(bedroom and full bath upstairs) Home Equipment: Walker - 2 wheels;Cane - single point;Cane - quad;Wheelchair - manual      Prior Function Level of Independence: Independent with assistive device(s)         Comments: Pt ambulating with SPC since Dec due to increased pain. Pt leaves quad cane at bottom of  stairs to use for flight of indor stairs and upstairs ambulation. No falls in the past 6 months.  Ind with ADLs.  Still driving.      Hand Dominance        Extremity/Trunk Assessment   Upper Extremity Assessment Upper Extremity Assessment: Overall WFL for tasks assessed    Lower Extremity Assessment Lower Extremity Assessment: RLE  deficits/detail RLE Deficits / Details: Unable to formally assess s/p R THA.  Functionally pt able to perform heel slides x10 and perform weight shifts in standing to RLE and remain steady with BUE support on RW.        Communication   Communication: No difficulties  Cognition Arousal/Alertness: Awake/alert Behavior During Therapy: WFL for tasks assessed/performed Overall Cognitive Status: Within Functional Limits for tasks assessed                                        General Comments General comments (skin integrity, edema, etc.): Daughter present during evaluation.  BP taken in supine at start of session: 116/70, in sitting remained similar with little change and pt denies dizziness.  In standing pt reported dizziness 5/10 which worsened after standing for ~1 minute and thus pt instructed to return to sitting and then supine.  BP taken in supine 96/50.  Nurse tech notified as RN not available, with a patient who was discharging.  NT to notify RN.     Exercises Total Joint Exercises Ankle Circles/Pumps: AROM;Both;10 reps;Supine Quad Sets: Strengthening;Both;10 reps;Supine Heel Slides: AROM;Right;10 reps;Supine Marching in Standing: Both;10 reps;Standing Other Exercises Other Exercises: Weight shifting in standing x10 each LE with BUE support on RW   Assessment/Plan    PT Assessment Patient needs continued PT services  PT Problem List Decreased strength;Decreased range of motion;Decreased activity tolerance;Decreased balance;Decreased mobility;Decreased knowledge of use of DME;Decreased safety awareness;Pain       PT Treatment Interventions DME instruction;Gait training;Stair training;Functional mobility training;Therapeutic activities;Therapeutic exercise;Balance training;Neuromuscular re-education;Patient/family education;Modalities    PT Goals (Current goals can be found in the Care Plan section)  Acute Rehab PT Goals Patient Stated Goal: to be as independent  as possible PT Goal Formulation: With patient Time For Goal Achievement: 08/27/18 Potential to Achieve Goals: Good    Frequency BID   Barriers to discharge Inaccessible home environment;Decreased caregiver support Lives alone and has steps to enter home and inside home    Co-evaluation               AM-PAC PT "6 Clicks" Mobility  Outcome Measure Help needed turning from your back to your side while in a flat bed without using bedrails?: A Little Help needed moving from lying on your back to sitting on the side of a flat bed without using bedrails?: A Little Help needed moving to and from a bed to a chair (including a wheelchair)?: A Little Help needed standing up from a chair using your arms (e.g., wheelchair or bedside chair)?: A Little Help needed to walk in hospital room?: A Little Help needed climbing 3-5 steps with a railing? : A Little 6 Click Score: 18    End of Session Equipment Utilized During Treatment: Gait belt Activity Tolerance: Treatment limited secondary to medical complications (Comment)(limited due to dizziness, orthostasis) Patient left: in bed;with call bell/phone within reach;with bed alarm set;with family/visitor present;with SCD's reapplied Nurse Communication: Mobility status;Other (comment)(drop in BP (communicated to nurse tech, RN unavailable)) PT  Visit Diagnosis: Pain;Unsteadiness on feet (R26.81);Other abnormalities of gait and mobility (R26.89);Muscle weakness (generalized) (M62.81) Pain - Right/Left: Right Pain - part of body: Hip    Time: [id number] PT Time Calculation (min) (ACUTE ONLY): 39 min   Charges:   PT Evaluation $PT Eval Low Complexity: 1 Low PT Treatments $Therapeutic Exercise: 8-22 mins $Therapeutic Activity: 8-22 mins        Encarnacion ChuAshley Calieb Lichtman PT, DPT 08/13/2018, 4:13 PM

## 2018-08-13 NOTE — Anesthesia Post-op Follow-up Note (Signed)
Anesthesia QCDR form completed.        

## 2018-08-13 NOTE — Plan of Care (Signed)
Problem: Education: Goal: Knowledge of General Education information will improve Description Including pain rating scale, medication(s)/side effects and non-pharmacologic comfort measures Outcome: Progressing   Problem: Health Behavior/Discharge Planning: Goal: Ability to manage health-related needs will improve Outcome: Progressing   Problem: Clinical Measurements: Goal: Ability to maintain clinical measurements within normal limits will improve Outcome: Progressing Goal: Will remain free from infection Outcome: Progressing Goal: Diagnostic test results will improve Outcome: Progressing Goal: Respiratory complications will improve Outcome: Progressing Goal: Cardiovascular complication will be avoided Outcome: Progressing   Problem: Activity: Goal: Risk for activity intolerance will decrease Outcome: Progressing   Problem: Nutrition: Goal: Adequate nutrition will be maintained Outcome: Progressing   Problem: Coping: Goal: Level of anxiety will decrease Outcome: Progressing   Problem: Elimination: Goal: Will not experience complications related to bowel motility Outcome: Progressing Goal: Will not experience complications related to urinary retention Outcome: Progressing   Problem: Pain Managment: Goal: General experience of comfort will improve Outcome: Progressing   Problem: Safety: Goal: Ability to remain free from injury will improve Outcome: Progressing   Problem: Skin Integrity: Goal: Risk for impaired skin integrity will decrease Outcome: Progressing   Problem: Education: Goal: Knowledge of the prescribed therapeutic regimen will improve Outcome: Progressing Goal: Understanding of discharge needs will improve Outcome: Progressing Goal: Individualized Educational Video(s) Outcome: Progressing   Problem: Activity: Goal: Ability to avoid complications of mobility impairment will improve Outcome: Progressing Goal: Ability to tolerate increased  activity will improve Outcome: Progressing   Problem: Clinical Measurements: Goal: Postoperative complications will be avoided or minimized Outcome: Progressing   Problem: Pain Management: Goal: Pain level will decrease with appropriate interventions Outcome: Progressing   Problem: Skin Integrity: Goal: Will show signs of wound healing Outcome: Progressing   Problem: Education: Goal: Knowledge of General Education information will improve Description Including pain rating scale, medication(s)/side effects and non-pharmacologic comfort measures Outcome: Progressing   Problem: Health Behavior/Discharge Planning: Goal: Ability to manage health-related needs will improve Outcome: Progressing   Problem: Clinical Measurements: Goal: Ability to maintain clinical measurements within normal limits will improve Outcome: Progressing Goal: Will remain free from infection Outcome: Progressing Goal: Diagnostic test results will improve Outcome: Progressing Goal: Respiratory complications will improve Outcome: Progressing Goal: Cardiovascular complication will be avoided Outcome: Progressing   Problem: Activity: Goal: Risk for activity intolerance will decrease Outcome: Progressing   Problem: Nutrition: Goal: Adequate nutrition will be maintained Outcome: Progressing   Problem: Coping: Goal: Level of anxiety will decrease Outcome: Progressing   Problem: Elimination: Goal: Will not experience complications related to bowel motility Outcome: Progressing Goal: Will not experience complications related to urinary retention Outcome: Progressing   Problem: Pain Managment: Goal: General experience of comfort will improve Outcome: Progressing   Problem: Safety: Goal: Ability to remain free from injury will improve Outcome: Progressing   Problem: Skin Integrity: Goal: Risk for impaired skin integrity will decrease Outcome: Progressing   Problem: Education: Goal: Knowledge  of the prescribed therapeutic regimen will improve Outcome: Progressing Goal: Understanding of discharge needs will improve Outcome: Progressing Goal: Individualized Educational Video(s) Outcome: Progressing   Problem: Activity: Goal: Ability to avoid complications of mobility impairment will improve Outcome: Progressing Goal: Ability to tolerate increased activity will improve Outcome: Progressing   Problem: Clinical Measurements: Goal: Postoperative complications will be avoided or minimized Outcome: Progressing   Problem: Pain Management: Goal: Pain level will decrease with appropriate interventions Outcome: Progressing   Problem: Skin Integrity: Goal: Will show signs of wound healing Outcome: Progressing   Problem: Education: Goal: Knowledge  of General Education information will improve Description Including pain rating scale, medication(s)/side effects and non-pharmacologic comfort measures Outcome: Progressing   Problem: Health Behavior/Discharge Planning: Goal: Ability to manage health-related needs will improve Outcome: Progressing   Problem: Clinical Measurements: Goal: Ability to maintain clinical measurements within normal limits will improve Outcome: Progressing Goal: Will remain free from infection Outcome: Progressing Goal: Diagnostic test results will improve Outcome: Progressing Goal: Respiratory complications will improve Outcome: Progressing Goal: Cardiovascular complication will be avoided Outcome: Progressing   Problem: Activity: Goal: Risk for activity intolerance will decrease Outcome: Progressing   Problem: Nutrition: Goal: Adequate nutrition will be maintained Outcome: Progressing   Problem: Coping: Goal: Level of anxiety will decrease Outcome: Progressing   Problem: Elimination: Goal: Will not experience complications related to bowel motility Outcome: Progressing Goal: Will not experience complications related to urinary  retention Outcome: Progressing   Problem: Pain Managment: Goal: General experience of comfort will improve Outcome: Progressing   Problem: Safety: Goal: Ability to remain free from injury will improve Outcome: Progressing   Problem: Skin Integrity: Goal: Risk for impaired skin integrity will decrease Outcome: Progressing   Problem: Education: Goal: Knowledge of the prescribed therapeutic regimen will improve Outcome: Progressing Goal: Understanding of discharge needs will improve Outcome: Progressing Goal: Individualized Educational Video(s) Outcome: Progressing   Problem: Activity: Goal: Ability to avoid complications of mobility impairment will improve Outcome: Progressing Goal: Ability to tolerate increased activity will improve Outcome: Progressing   Problem: Clinical Measurements: Goal: Postoperative complications will be avoided or minimized Outcome: Progressing   Problem: Pain Management: Goal: Pain level will decrease with appropriate interventions Outcome: Progressing   Problem: Skin Integrity: Goal: Will show signs of wound healing Outcome: Progressing   Problem: Education: Goal: Knowledge of General Education information will improve Description Including pain rating scale, medication(s)/side effects and non-pharmacologic comfort measures Outcome: Progressing   Problem: Health Behavior/Discharge Planning: Goal: Ability to manage health-related needs will improve Outcome: Progressing   Problem: Clinical Measurements: Goal: Ability to maintain clinical measurements within normal limits will improve Outcome: Progressing Goal: Will remain free from infection Outcome: Progressing Goal: Diagnostic test results will improve Outcome: Progressing Goal: Respiratory complications will improve Outcome: Progressing Goal: Cardiovascular complication will be avoided Outcome: Progressing   Problem: Activity: Goal: Risk for activity intolerance will  decrease Outcome: Progressing   Problem: Nutrition: Goal: Adequate nutrition will be maintained Outcome: Progressing   Problem: Coping: Goal: Level of anxiety will decrease Outcome: Progressing   Problem: Elimination: Goal: Will not experience complications related to bowel motility Outcome: Progressing Goal: Will not experience complications related to urinary retention Outcome: Progressing   Problem: Pain Managment: Goal: General experience of comfort will improve Outcome: Progressing   Problem: Safety: Goal: Ability to remain free from injury will improve Outcome: Progressing   Problem: Skin Integrity: Goal: Risk for impaired skin integrity will decrease Outcome: Progressing   Problem: Education: Goal: Knowledge of the prescribed therapeutic regimen will improve Outcome: Progressing Goal: Understanding of discharge needs will improve Outcome: Progressing Goal: Individualized Educational Video(s) Outcome: Progressing   Problem: Activity: Goal: Ability to avoid complications of mobility impairment will improve Outcome: Progressing Goal: Ability to tolerate increased activity will improve Outcome: Progressing   Problem: Clinical Measurements: Goal: Postoperative complications will be avoided or minimized Outcome: Progressing   Problem: Pain Management: Goal: Pain level will decrease with appropriate interventions Outcome: Progressing   Problem: Skin Integrity: Goal: Will show signs of wound healing Outcome: Progressing

## 2018-08-13 NOTE — Op Note (Signed)
08/13/2018  9:44 AM  PATIENT:  Mary Vasquez   MRN: [id number]  PRE-OPERATIVE DIAGNOSIS:  Osteoarthritis right hip   POST-OPERATIVE DIAGNOSIS: Same  Procedure: Right Total Hip Replacement  Surgeon: Dola Argyle. Odis Luster, MD   Assist: Altamese Cabal, PA-C  Anesthesia: Spinal   EBL: 100 mL   Specimens: None   Drains: None   Components used: A size 2 Polarstem [employer], R3 size 50 mm shell, and a 32 mm + 0 mm head    Description of the procedure in detail: After informed consent was obtained and the appropriate extremity marked in the pre-operative holding area, the patient was taken to the operating room and placed in the supine position on the fracture table. All pressure points were well padded and bilateral lower extremities were place in traction spars. The hip was prepped and draped in standard sterile fashion. A spinal anesthetic had been delivered by the anesthesia team. The skin and subcutaneous tissues were injected with a mixture of Marcaine with epinephrine for post-operative pain. A longitudinal incision approximately 10 cm in length was carried out from the anterior superior iliac spine to the greater trochanter. The tensor fascia was divided and blunt dissection was taken down to the level of the joint capsule. The lateral circumflex vessels were cauterized. Deep retractors were placed and a portion of the anterior capsule was excised. Using fluoroscopy the neck cut was planned and carried out with a sagittal saw. The head was passed from the field with use of a corkscrew and hip skid. Deep retractors were placed along the acetabulum and the degenerative labrum and large osteophytes were removed with a Rongeur. The cup was sequentially reamed to a size 50 mm. The wound was irrigated and using fluoroscopy the size 50 mm cup was impacted in to anatomic position. A single screw was placed followed by a threaded hole cover. The final liner was impacted in to position. Attention was  then turned to the proximal femur. The leg was placed in extension and external rotation. The canal was opened and sequentially broached to a size 2. The trial components were placed and the hip relocated. The components were found to be in good position using fluoroscopy. The hip was dislocated and the trial components removed. The final components were impacted in to position and the hip relocated. The final components were again check with fluoroscopy and found to be in good position. Hemostasis was achieved with electrocautery. The deep capsule was injected with Marcaine and epinephrine. The wound was irrigated with bacitracin laced normal saline and the tensor fascia closed with #2 Quill suture. The subcutaneous tissues were closed with 2-0 vicryl and staples for the skin. A sterile dressing was applied and an abduction pillow. Patient tolerated the procedure well and there were no apparent complication. Patient was taken to the recovery room in good condition.   Cassell Smiles, MD

## 2018-08-14 LAB — BASIC METABOLIC PANEL
Anion gap: 6 (ref 5–15)
BUN: 18 mg/dL (ref 8–23)
CO2: 25 mmol/L (ref 22–32)
Calcium: 8.5 mg/dL — ABNORMAL LOW (ref 8.9–10.3)
Chloride: 104 mmol/L (ref 98–111)
Creatinine, Ser: 0.9 mg/dL (ref 0.44–1.00)
GFR calc non Af Amer: >60 mL/min (ref >60)
Glucose, Bld: 99 mg/dL (ref 70–99)
Potassium: 4.1 mmol/L (ref 3.5–5.1)
Sodium: 135 mmol/L (ref 135–145)

## 2018-08-14 LAB — CBC
HEMATOCRIT: 31.3 % — AB (ref 36.0–46.0)
HEMOGLOBIN: 10.3 g/dL — AB (ref 12.0–15.0)
MCH: 31.1 pg (ref 26.0–34.0)
MCHC: 32.9 g/dL (ref 30.0–36.0)
MCV: 94.6 fL (ref 80.0–100.0)
Platelets: 194 10*3/uL (ref 150–400)
RBC: 3.31 MIL/uL — ABNORMAL LOW (ref 3.87–5.11)
RDW: 12.7 % (ref 11.5–15.5)
WBC: 6.2 10*3/uL (ref 4.0–10.5)
nRBC: 0 % (ref 0.0–0.2)

## 2018-08-14 NOTE — [place] FL2 (Signed)
[place] MEDICAID FL2 LEVEL OF CARE SCREENING TOOL     IDENTIFICATION  Patient Name: Mary Vasquez Birthdate: 12/03/1940 Sex: female Admission Date (Current Location): 08/13/2018  [place]   Facility and Address:  [facility], [address], [place], [place] [address]      Provider Number: [id number]  Attending Physician Name and Address:  Lyndle HerrlichBowers, James R, MD  Relative Name and Phone Number:       Current Level of Care: Hospital Recommended Level of Care: Skilled Nursing Facility Prior Approval Number:    Date Approved/Denied:   PASRR Number: [id number])  Discharge Plan: SNF    Current Diagnoses: Patient Active Problem List   Diagnosis Date Noted  . Status post THR (total hip replacement) 08/13/2018  . CKD (chronic kidney disease) stage 3, GFR 30-59 ml/min (HCC) 07/29/2018  . Hypothyroidism 12/09/2016  . Adjustment disorder 12/08/2016  . Chronic hip pain, right 08/28/2016  . Breast mass, right 06/14/2014  . Rash and nonspecific skin eruption 06/14/2014  . Obesity, unspecified 04/25/2013  . Epidemic vertigo 04/25/2013  . Preoperative evaluation of a medical condition to rule out surgical contraindications (TAR required) 04/25/2013  . 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 Hyperlipidemia with target LDL less than 100 02/21/2012  . Essential hypertension     Orientation RESPIRATION BLADDER Height & Weight     Self, Time, Situation, Place  Normal Continent Weight: 157 lb 9 oz (71.5 kg) Height:  5\' 1"  (154.9 cm)  BEHAVIORAL SYMPTOMS/MOOD NEUROLOGICAL BOWEL NUTRITION STATUS      Continent Diet(Diet: Regular )  AMBULATORY STATUS COMMUNICATION OF NEEDS Skin   Extensive Assist   Surgical wounds(Incision: Right Hip. )                       Personal Care Assistance Level of Assistance  Bathing, Feeding, Dressing Bathing Assistance: Limited assistance Feeding assistance: Independent Dressing Assistance:  Limited assistance     Functional Limitations Info  Sight, Hearing, Speech Sight Info: Adequate Hearing Info: Adequate Speech Info: Adequate    SPECIAL CARE FACTORS FREQUENCY  PT (By licensed PT), OT (By licensed OT)     PT Frequency: (5) OT Frequency: (5)            Contractures      Additional Factors Info  Code Status, Allergies Code Status Info: (Full Code. ) Allergies Info: (Hydrocodone, Penicillin G, Augmentin Amoxicillin-pot Clavulanate)           Current Medications (08/14/2018):  This is the current hospital active medication list Current Facility-Administered Medications  Medication Dose Route Frequency Provider Last Rate Last Dose  . acetaminophen (TYLENOL) tablet 325-650 mg  325-650 mg Oral Q6H PRN Lyndle HerrlichBowers, James R, MD      . acetaminophen (TYLENOL) tablet 500 mg  500 mg Oral Q6H Lyndle HerrlichBowers, James R, MD   500 mg at 08/13/18 1815  . amLODipine (NORVASC) tablet 5 mg  5 mg Oral Daily Lyndle HerrlichBowers, James R, MD      . aspirin chewable tablet 81 mg  81 mg Oral BID Lyndle HerrlichBowers, James R, MD   81 mg at 08/13/18 2147  . bisacodyl (DULCOLAX) suppository 10 mg  10 mg Rectal Daily PRN Lyndle HerrlichBowers, James R, MD      . cholecalciferol (VITAMIN D3) tablet 1,000 Units  1,000 Units Oral BID Lyndle HerrlichBowers, James R, MD   1,000 Units at 08/13/18 2147  . docusate sodium (COLACE) capsule 100 mg  100 mg Oral BID  Lyndle HerrlichBowers, James R, MD   100 mg at 08/13/18 2147  . gabapentin (NEURONTIN) capsule 300 mg  300 mg Oral TID Lyndle HerrlichBowers, James R, MD   300 mg at 08/13/18 2147  . ketorolac (TORADOL) 15 MG/ML injection 7.5 mg  7.5 mg Intravenous Q6H Lyndle HerrlichBowers, James R, MD   7.5 mg at 08/13/18 1815  . lactated ringers infusion   Intravenous Continuous Lyndle HerrlichBowers, James R, MD 75 mL/hr at 08/13/18 1600    . levothyroxine (SYNTHROID, LEVOTHROID) tablet 25 mcg  25 mcg Oral Q0600 Lyndle HerrlichBowers, James R, MD   25 mcg at 08/14/18 [id number]  . magnesium citrate solution 1 Bottle  1 Bottle Oral Once PRN Lyndle HerrlichBowers, James R, MD      . magnesium hydroxide (MILK OF  MAGNESIA) suspension 30 mL  30 mL Oral Daily PRN Lyndle HerrlichBowers, James R, MD      . menthol-cetylpyridinium (CEPACOL) lozenge 3 mg  1 lozenge Oral PRN Lyndle HerrlichBowers, James R, MD       Or  . phenol (CHLORASEPTIC) mouth spray 1 spray  1 spray Mouth/Throat PRN Lyndle HerrlichBowers, James R, MD      . metoCLOPramide (REGLAN) tablet 5-10 mg  5-10 mg Oral Q8H PRN Lyndle HerrlichBowers, James R, MD       Or  . metoCLOPramide (REGLAN) injection 5-10 mg  5-10 mg Intravenous Q8H PRN Lyndle HerrlichBowers, James R, MD      . morphine 4 MG/ML injection 0.52-1 mg  0.52-1 mg Intravenous Q2H PRN Lyndle HerrlichBowers, James R, MD      . ondansetron [facility](ZOFRAN) tablet 4 mg  4 mg Oral Q6H PRN Lyndle HerrlichBowers, James R, MD       Or  . ondansetron [facility](ZOFRAN) injection 4 mg  4 mg Intravenous Q6H PRN Lyndle HerrlichBowers, James R, MD      . oxyCODONE (Oxy IR/ROXICODONE) immediate release tablet 5 mg  5 mg Oral Q4H PRN Lyndle HerrlichBowers, James R, MD      . traMADol Janean Sark(ULTRAM) tablet 50 mg  50 mg Oral Q6H Lyndle HerrlichBowers, James R, MD   50 mg at 08/13/18 1815     Discharge Medications: Please see discharge summary for a list of discharge medications.  Relevant Imaging Results:  Relevant Lab Results:   Additional Information (SSN: [id number])  Tasia Liz, Darleen CrockerBailey M, LCSW

## 2018-08-14 NOTE — Care Management (Signed)
Mary Vasquez is unable to accept the patient.  The patient made a second choice of [facility], notified [place].

## 2018-08-14 NOTE — Progress Notes (Signed)
Physical Therapy Treatment Patient Details Name: Mary Vasquez MRN: [id number] DOB: Jun 22, 1941 Today's Date: 08/14/2018    History of Present Illness Pt is a 78 y/o F s/p R THA. Pt has been orthostatic during hospital stay.     PT Comments    Mary Vasquez limited by symptomatic +orthostatics this session with 2 separate sit>stands from the bed this session.  See general comments below for BP readings.  Pt requires min guard assist for bed mobility and sit<>stand.  Follow up recommendations remain appropriate.   Follow Up Recommendations  SNF     Equipment Recommendations  Other (comment)(shower seat)    Recommendations for Other Services       Precautions / Restrictions Precautions Precautions: Fall Precaution Comments: direct anterior, no hip precautions Restrictions Weight Bearing Restrictions: Yes RLE Weight Bearing: Weight bearing as tolerated    Mobility  Bed Mobility Overal bed mobility: Needs Assistance Bed Mobility: Supine to Sit;Sit to Supine     Supine to sit: Min guard;HOB elevated Sit to supine: Min guard   General bed mobility comments: Cues for sequencing.  Pt requires use of bed rail with increased time and effort.  Again, cues for sequencing for sit>supine.   Transfers Overall transfer level: Needs assistance Equipment used: Rolling walker (2 wheeled) Transfers: Sit to/from Stand Sit to Stand: Min guard         General transfer comment: Pt demonstrated proper hand placement.  Min guard as pt reporting dizziness.   Ambulation/Gait             General Gait Details: Not safe to attempt at this time due to orthostasis   Stairs             Wheelchair Mobility    Modified Rankin (Stroke Patients Only)       Balance Overall balance assessment: Needs assistance Sitting-balance support: No upper extremity supported;Feet supported Sitting balance-Leahy Scale: Good     Standing balance support: Bilateral upper extremity  supported;During functional activity Standing balance-Leahy Scale: Poor Standing balance comment: Pt relies on BUE support from RW for static standing and marching activities                            Cognition Arousal/Alertness: Awake/alert Behavior During Therapy: WFL for tasks assessed/performed Overall Cognitive Status: Within Functional Limits for tasks assessed                                        Exercises Total Joint Exercises Ankle Circles/Pumps: AROM;Both;10 reps;Supine Quad Sets: Strengthening;Both;10 reps;Supine Heel Slides: AROM;Right;10 reps;Supine Marching in Standing: Both;10 reps;Standing Other Exercises Other Exercises: Weight shifting in standing x10 each LE with BUE support on RW    General Comments General comments (skin integrity, edema, etc.): BP in supine 135/67, sitting EOB 132/58.  Pt asymptomatic in supine and sitting.  After standing for ~30 seconds the pt becomes nauseous and dizzy and BP reading 90/67.  Sitting EOB 103/58.  Symptoms resolving and pt stood at bedside again with symptoms returning and BP 104/61.  Pt decided she needed to sit due to nausea and dizziness.  Symptoms did not resolve and pt returned to supine.  BP taken in supine with HOB elevated 110/62.  Pt reports symptoms resolving but with some residual nausea.  RN notified of symptoms and BP readings.  Pertinent Vitals/Pain Pain Assessment: Faces Faces Pain Scale: Hurts even more Pain Location: R hip Pain Descriptors / Indicators: Sore;Guarding Pain Intervention(s): Limited activity within patient's tolerance;Monitored during session;Utilized relaxation techniques    Home Living                      Prior Function            PT Goals (current goals can now be found in the care plan section) Acute Rehab PT Goals Patient Stated Goal: to be able to walk PT Goal Formulation: With patient Time For Goal Achievement: 08/27/18 Potential to  Achieve Goals: Good Progress towards PT goals: Not progressing toward goals - comment(+orthostatics)    Frequency    BID      PT Plan Current plan Vasquez appropriate    Co-evaluation              AM-PAC PT "6 Clicks" Mobility   Outcome Measure  Help needed turning from your back to your side while in a flat bed without using bedrails?: A Little Help needed moving from lying on your back to sitting on the side of a flat bed without using bedrails?: A Little Help needed moving to and from a bed to a chair (including a wheelchair)?: A Little Help needed standing up from a chair using your arms (e.g., wheelchair or bedside chair)?: A Little Help needed to walk in hospital room?: A Little Help needed climbing 3-5 steps with a railing? : A Little 6 Click Score: 18    End of Session Equipment Utilized During Treatment: Gait belt Activity Tolerance: Treatment limited secondary to medical complications (Comment)(limited due to dizziness, nausea, orthostasis) Patient left: in bed;with call bell/phone within reach;with bed alarm set;with SCD's reapplied Nurse Communication: Mobility status;Other (comment)(+orthostatics, pt requests nausea meds) PT Visit Diagnosis: Pain;Unsteadiness on feet (R26.81);Other abnormalities of gait and mobility (R26.89);Muscle weakness (generalized) (M62.81) Pain - Right/Left: Right Pain - part of body: Hip     Time: [id number] PT Time Calculation (min) (ACUTE ONLY): 39 min  Charges:  $Therapeutic Exercise: 8-22 mins $Therapeutic Activity: 23-37 mins                     Encarnacion ChuAshley Freedom Lopezperez PT, DPT 08/14/2018, 10:50 AM

## 2018-08-14 NOTE — Progress Notes (Addendum)
  Subjective:  Patient reports pain as mild.    Objective:   VITALS:   Vitals:   08/13/18 1510 08/13/18 1607 08/13/18 2033 08/14/18 0800  BP: (!) 107/59 114/64 (!) 105/59 125/64  Pulse: 72 71 75 84  Resp: 16 17 16 18   Temp: (!) 97.5 F (36.4 C) 97.7 F (36.5 C) 98.2 F (36.8 C) 99.6 F (37.6 C)  TempSrc: Axillary Oral Oral Oral  SpO2: 100% 96% 96% 97%  Weight:  71.5 kg    Height:  5\' 1"  (1.549 m)      PHYSICAL EXAM:  Neurovascular intact Sensation intact distally Dorsiflexion/Plantar flexion intact Incision: dressing C/D/I Compartment soft  LABS  Results for orders placed or performed during the hospital encounter of 08/13/18 (from the past 24 hour(s))  CBC     Status: Abnormal   Collection Time: 08/14/18  3:23 AM  Result Value Ref Range   WBC 6.2 4.0 - 10.5 K/uL   RBC 3.31 (L) 3.87 - 5.11 MIL/uL   Hemoglobin 10.3 (L) 12.0 - 15.0 g/dL   HCT 16.131.3 (L) 09.636.0 - 04.546.0 %   MCV 94.6 80.0 - 100.0 fL   MCH 31.1 26.0 - 34.0 pg   MCHC 32.9 30.0 - 36.0 g/dL   RDW 40.912.7 81.111.5 - 91.415.5 %   Platelets 194 150 - 400 K/uL   nRBC 0.0 0.0 - 0.2 %  Basic metabolic panel     Status: Abnormal   Collection Time: 08/14/18  3:23 AM  Result Value Ref Range   Sodium 135 135 - 145 mmol/L   Potassium 4.1 3.5 - 5.1 mmol/L   Chloride 104 98 - 111 mmol/L   CO2 25 22 - 32 mmol/L   Glucose, Bld 99 70 - 99 mg/dL   BUN 18 8 - 23 mg/dL   Creatinine, Ser 7.820.90 0.44 - 1.00 mg/dL   Calcium 8.5 (L) 8.9 - 10.3 mg/dL   GFR calc non Af Amer >60 >60 mL/min   GFR calc Af Amer >60 >60 mL/min   Anion gap 6 5 - 15    Dg Pelvis Portable  Result Date: 08/13/2018 CLINICAL DATA:  Osteoarthritis of the right hip. Status post right total hip arthroplasty. EXAM: PORTABLE PELVIS 1-2 VIEWS COMPARISON:  Radiographs dated 08/27/2016 FINDINGS: Right total hip prosthesis appears in excellent position in the AP projection. No fractures. Bones of the pelvis and left hip appear normal. IMPRESSION: Satisfactory appearance in  the AP projection of the right hip and pelvis after total hip arthroplasty. Electronically Signed   By: Francene BoyersJames  Maxwell M.D.   On: 08/13/2018 10:52   Dg Hip Operative Unilat W Or W/o Pelvis Right  Result Date: 08/13/2018 CLINICAL DATA:  Total hip arthroplasty. FLUOROSCOPY TIME:  19 seconds. Images: 7 EXAM: OPERATIVE RIGHT HIP (WITH PELVIS IF PERFORMED) 7 VIEWS TECHNIQUE: Fluoroscopic spot image(s) were submitted for interpretation post-operatively. COMPARISON:  None. FINDINGS: By the end of the study, the patient is status post right hip arthroplasty. Acetabular and femoral components are in good position. IMPRESSION: Right hip arthroplasty as above. Electronically Signed   By: Gerome Samavid  Williams III M.D   On: 08/13/2018 10:46    Assessment/Plan: 1 Day Post-Op   Active Problems:   Osteoarthritis of right hip S/P Right total hip, anterior  Up with therapy D/C IV fluids Plan for discharge tomorrow   Lyndle HerrlichJames R Bowers , MD 08/14/2018, 8:36 AM

## 2018-08-14 NOTE — Care Management Note (Signed)
Case Management Note  Patient Details  Name: CARLI LEFEVERS MRN: [id number] Date of Birth: 12-08-1940  Subjective/Objective:                   Met with the patient and her children to discuss DC plan and needs The patient has been provided with the [facility] list per CMS.gov Patient has chosen [employer] of choice Patient has a RW, she needs a 3 in 1  Acupuncturist at [facility] of the 3 in 1 need Patient has [facility] [address] as a pharmacy Can afford medication Patient has transportation by Daughter and friends  Action/Plan: [facility] list provided per [email or web address]. Notified [place] of choice  Expected Discharge Date:                  Expected Discharge Plan:     In-House Referral:     Discharge planning Services  CM Consult  Post Acute Care Choice:    Choice offered to:     DME Arranged:    DME Agency:     HH Arranged:  PT [place]:  [place] [facility]  Status of Service:  Completed, signed off  If discussed at [place] of Stay Meetings, dates discussed:    Additional Comments:  Su Hilt, RN 08/14/2018, 2:48 PM

## 2018-08-14 NOTE — Clinical Social Work Note (Signed)
Clinical Social Work Assessment  Patient Details  Name: Mary Vasquez MRN: [id number] Date of Birth: July 29, 1940  Date of referral:  08/14/18               Reason for consult:  Facility Placement                Permission sought to share information with:  Chartered certified accountant granted to share information::  Yes, Verbal Permission Granted  Name::      [place]::   [place]   Relationship::     Contact Information:     Housing/Transportation Living arrangements for the past 2 months:  [place] of Information:  Patient Patient Interpreter Needed:  None Criminal Activity/Legal Involvement Pertinent to Current Situation/Hospitalization:  No - Comment as needed Significant Relationships:  Adult Children Lives with:  Self Do you feel safe going back to the place where you live?  Yes Need for family participation in patient care:  Yes (Comment)  Care giving concerns:  Patient lives in [place] alone.    Social Worker assessment / plan:  Holiday representative (CSW) received SNF consult. PT is recommending SNF. CSW met with patient alone at bedside to discuss D/C plan. Patient was alert and oriented X4 and was laying in the bed. CSW introduced self and explained role of CSW department. Per patient she lives alone in [place] and has 1 daughter and 2 sons. CSW explained that PT is recommending SNF and that Health Team will have to approve SNF. CSW explained that patient will have a co-pay of $10-$20 per day at [facility]. Patient verbalized her understanding and is agreeable to SNF search in [place]. Patient is hoping to go home if she improves with PT. FL2 complete and faxed out.   CSW presented bed offer to patient and discussed that quality measures of the facilities. Patient chose [facility]. CSW started Health Team SNF authorization today. [facility] liaison is aware of above. CSW will continue to follow and assist as  needed.   Employment status:  Disabled (Comment on whether or not currently receiving Disability), Retired Nurse, adult PT Recommendations:  [place] / Referral to community resources:  [place]  Patient/Family's Response to care:  Patient chose [facility].   Patient/Family's Understanding of and Emotional Response to Diagnosis, Current Treatment, and Prognosis:  Patient was very pleasant and thanked CSW for assistance.   Emotional Assessment Appearance:  Appears stated age Attitude/Demeanor/Rapport:    Affect (typically observed):  Accepting, Adaptable, Pleasant Orientation:  Oriented to Self, Oriented to Place, Oriented to  Time, Oriented to Situation Alcohol / Substance use:  Not Applicable Psych involvement (Current and /or in the community):  No (Comment)  Discharge Needs  Concerns to be addressed:  Discharge Planning Concerns Readmission within the last 30 days:  No Current discharge risk:  Dependent with Mobility Barriers to Discharge:  Continued Medical Work up   [employer], Veronia Beets, LCSW 08/14/2018, 1:39 PM

## 2018-08-14 NOTE — Clinical Social Work Placement (Signed)
   CLINICAL SOCIAL WORK PLACEMENT  NOTE  Date:  08/14/2018  Patient Details  Name: Mary Vasquez MRN: [id number] Date of Birth: 04-25-1941  Clinical Social Work is seeking post-discharge placement for this patient at the Skilled  Nursing Facility level of care (*CSW will initial, date and re-position this form in  chart as items are completed):  Yes   Patient/family provided with [place] Clinical Social Work Department's list of facilities offering this level of care within the geographic area requested by the patient (or if unable, by the patient's family).  Yes   Patient/family informed of their freedom to choose among providers that offer the needed level of care, that participate in Medicare, Medicaid or managed care program needed by the patient, have an available bed and are willing to accept the patient.  Yes   Patient/family informed of [facility]'s ownership interest in [facility] and [facility], as well as of the fact that they are under no obligation to receive care at these facilities.  PASRR submitted to EDS on 08/14/18     PASRR number received on 08/14/18     Existing PASRR number confirmed on       FL2 transmitted to all facilities in geographic area requested by pt/family on 08/14/18     FL2 transmitted to all facilities within larger geographic area on       Patient informed that his/her managed care company has contracts with or will negotiate with certain facilities, including the following:        Yes   Patient/family informed of bed offers received.  Patient chooses bed at ([facility] )     Physician recommends and patient chooses bed at      Patient to be transferred to   on  .  Patient to be transferred to facility by       Patient family notified on   of transfer.  Name of family member notified:        PHYSICIAN       Additional Comment:    _______________________________________________ Kamron Portee, Darleen Crocker, LCSW 08/14/2018, 1:38 PM

## 2018-08-14 NOTE — Progress Notes (Signed)
Physical Therapy Treatment Patient Details Name: Mary Vasquez MRN: [id number] DOB: 11-24-1940 Today's Date: 08/14/2018    History of Present Illness Pt is a 78 y/o F s/p R THA. Pt has been orthostatic during hospital stay.     PT Comments    Mary Vasquez made good progress toward her mobility goals this afternoon but continues to require close min guard assist for all aspects of mobility.  She was +orthostatic again this afternoon with sit>stand but was asymptomatic so pt was able to initiate gait training this date. Pt became dizzy and nauseous after ambulating 100 ft and was instructed to sit.  Follow up recommendations remain appropriate.    Follow Up Recommendations  SNF     Equipment Recommendations  Other (comment)(shower seat)    Recommendations for Other Services       Precautions / Restrictions Precautions Precautions: Fall;Other (comment)(+orthostatic) Precaution Comments: direct anterior, no hip precautions Restrictions Weight Bearing Restrictions: Yes RLE Weight Bearing: Weight bearing as tolerated    Mobility  Bed Mobility               General bed mobility comments: Pt sitting in chair at start and end of session  Transfers Overall transfer level: Needs assistance Equipment used: Rolling walker (2 wheeled) Transfers: Sit to/from Stand Sit to Stand: Min guard         General transfer comment: Pt demonstrated proper hand placement.  Min guard as pt +orthostatic.  Pt denies dizziness with sit>stand this session.   Ambulation/Gait Ambulation/Gait assistance: Min guard Gait Distance (Feet): 100 Feet Assistive device: Rolling walker (2 wheeled) Gait Pattern/deviations: Decreased weight shift to right;Antalgic     General Gait Details: Pt demonstrates step through gait pattern without cues needed.  Pt remains steady.  Pt reports onset of dizziness after ambulating 118ft and was instructed to sit.    Stairs             Wheelchair Mobility     Modified Rankin (Stroke Patients Only)       Balance Overall balance assessment: Needs assistance Sitting-balance support: No upper extremity supported;Feet supported Sitting balance-Leahy Scale: Good     Standing balance support: Bilateral upper extremity supported;During functional activity Standing balance-Leahy Scale: Fair Standing balance comment: Pt able to stand statically without UE support but relies on RW to ambulate                            Cognition Arousal/Alertness: Awake/alert Behavior During Therapy: WFL for tasks assessed/performed Overall Cognitive Status: Within Functional Limits for tasks assessed                                        Exercises      General Comments General comments (skin integrity, edema, etc.): BP in sitting at start of session 142/78, in standing 115/60 but pt asymptomatic.  Pt able to ambulate 100 ft before onset of dizziness and nausea and was immediately instructed to sit.  Pt rolled back to room in chair and BP taken with pt sitting upright in chair 128/60 with symptoms resolving.  RN notified.       Pertinent Vitals/Pain Pain Assessment: Faces Faces Pain Scale: Hurts little more Pain Location: R hip Pain Descriptors / Indicators: Guarding;Other (Comment)(stinging) Pain Intervention(s): Limited activity within patient's tolerance;Monitored during session    Home Living  Prior Function            PT Goals (current goals can now be found in the care plan section) Acute Rehab PT Goals Patient Stated Goal: to be able to walk farther next session PT Goal Formulation: With patient Time For Goal Achievement: 08/27/18 Potential to Achieve Goals: Good Progress towards PT goals: Progressing toward goals    Frequency    BID      PT Plan Current plan remains appropriate    Co-evaluation              AM-PAC PT "6 Clicks" Mobility   Outcome Measure  Help  needed turning from your back to your side while in a flat bed without using bedrails?: A Little Help needed moving from lying on your back to sitting on the side of a flat bed without using bedrails?: A Little Help needed moving to and from a bed to a chair (including a wheelchair)?: A Little Help needed standing up from a chair using your arms (e.g., wheelchair or bedside chair)?: A Little Help needed to walk in hospital room?: A Little Help needed climbing 3-5 steps with a railing? : A Little 6 Click Score: 18    End of Session Equipment Utilized During Treatment: Gait belt Activity Tolerance: Treatment limited secondary to medical complications (Comment)(limited due to dizziness, nausea, orthostasis) Patient left: with call bell/phone within reach;in chair;with chair alarm set;with family/visitor present Nurse Communication: Mobility status;Other (comment)(+orthostatics, dizziness when ambulating) PT Visit Diagnosis: Pain;Unsteadiness on feet (R26.81);Other abnormalities of gait and mobility (R26.89);Muscle weakness (generalized) (M62.81) Pain - Right/Left: Right Pain - part of body: Hip     Time: 3762-8315 PT Time Calculation (min) (ACUTE ONLY): 32 min  Charges:  $Gait Training: 8-22 mins $Therapeutic Activity: 8-22 mins                     Encarnacion Chu PT, DPT 08/14/2018, 2:32 PM

## 2018-08-14 NOTE — Anesthesia Postprocedure Evaluation (Signed)
Anesthesia Post Note  Patient: Mary Vasquez  Procedure(s) Performed: TOTAL HIP ARTHROPLASTY ANTERIOR APPROACH (Right )  Anesthesia Type: Spinal     Last Vitals:  Vitals:   08/13/18 1607 08/13/18 2033  BP: 114/64 (!) 105/59  Pulse: 71 75  Resp: 17 16  Temp: 36.5 C 36.8 C  SpO2: 96% 96%    Last Pain:  Vitals:   08/13/18 2033  TempSrc: Oral  PainSc:                  Annamary CarolinParas,  Siena Poehler A

## 2018-08-15 ENCOUNTER — Ambulatory Visit: Payer: Self-pay

## 2018-08-15 DIAGNOSIS — Z9181 History of falling: Secondary | ICD-10-CM | POA: Diagnosis not present

## 2018-08-15 DIAGNOSIS — Z96641 Presence of right artificial hip joint: Secondary | ICD-10-CM | POA: Diagnosis not present

## 2018-08-15 DIAGNOSIS — E039 Hypothyroidism, unspecified: Secondary | ICD-10-CM | POA: Diagnosis not present

## 2018-08-15 DIAGNOSIS — N183 Chronic kidney disease, stage 3 (moderate): Secondary | ICD-10-CM | POA: Diagnosis not present

## 2018-08-15 DIAGNOSIS — R32 Unspecified urinary incontinence: Secondary | ICD-10-CM | POA: Diagnosis not present

## 2018-08-15 DIAGNOSIS — I129 Hypertensive chronic kidney disease with stage 1 through stage 4 chronic kidney disease, or unspecified chronic kidney disease: Secondary | ICD-10-CM | POA: Diagnosis not present

## 2018-08-15 DIAGNOSIS — Z471 Aftercare following joint replacement surgery: Secondary | ICD-10-CM | POA: Diagnosis not present

## 2018-08-15 LAB — CBC
HCT: 33.1 % — ABNORMAL LOW (ref 36.0–46.0)
Hemoglobin: 10.8 g/dL — ABNORMAL LOW (ref 12.0–15.0)
MCH: 30.4 pg (ref 26.0–34.0)
MCHC: 32.6 g/dL (ref 30.0–36.0)
MCV: 93.2 fL (ref 80.0–100.0)
Platelets: 203 10*3/uL (ref 150–400)
RBC: 3.55 MIL/uL — ABNORMAL LOW (ref 3.87–5.11)
RDW: 12.9 % (ref 11.5–15.5)
WBC: 8.2 10*3/uL (ref 4.0–10.5)
nRBC: 0 % (ref 0.0–0.2)

## 2018-08-15 LAB — SURGICAL PATHOLOGY

## 2018-08-15 MED ORDER — OXYCODONE HCL 5 MG PO TABS: 5.0000 mg | ORAL_TABLET | ORAL | 0 refills | Status: DC | PRN

## 2018-08-15 MED ORDER — ASPIRIN 81 MG PO CHEW: 81.0000 mg | CHEWABLE_TABLET | Freq: Two times a day (BID) | ORAL | 0 refills | Status: DC

## 2018-08-15 MED ORDER — DOCUSATE SODIUM 100 MG PO CAPS: 100.0000 mg | ORAL_CAPSULE | Freq: Two times a day (BID) | ORAL | 0 refills | Status: DC

## 2018-08-15 NOTE — Progress Notes (Signed)
  Subjective:  Patient reports pain as moderate.  Dizziness after PT yesterday.  Non presently.  Sitting up looking well.  Objective:   VITALS:   Vitals:   08/14/18 1220 08/14/18 1558 08/14/18 2332 08/15/18 0049  BP: 137/68 (!) 130/58 127/62   Pulse: 80 75 92   Resp:   19   Temp: 99.1 F (37.3 C) 99 F (37.2 C) 100.1 F (37.8 C) 98.7 F (37.1 C)  TempSrc: Oral Oral Oral Oral  SpO2: 93% 96% 92%   Weight:      Height:        PHYSICAL EXAM:  Neurologically intact ABD soft Neurovascular intact Sensation intact distally Intact pulses distally Dorsiflexion/Plantar flexion intact Incision: no drainage  LABS  Results for orders placed or performed during the hospital encounter of 08/13/18 (from the past 24 hour(s))  CBC     Status: Abnormal   Collection Time: 08/15/18  3:58 AM  Result Value Ref Range   WBC 8.2 4.0 - 10.5 K/uL   RBC 3.55 (L) 3.87 - 5.11 MIL/uL   Hemoglobin 10.8 (L) 12.0 - 15.0 g/dL   HCT 56.4 (L) 33.2 - 95.1 %   MCV 93.2 80.0 - 100.0 fL   MCH 30.4 26.0 - 34.0 pg   MCHC 32.6 30.0 - 36.0 g/dL   RDW 88.4 16.6 - 06.3 %   Platelets 203 150 - 400 K/uL   nRBC 0.0 0.0 - 0.2 %    Dg Pelvis Portable  Result Date: 08/13/2018 CLINICAL DATA:  Osteoarthritis of the right hip. Status post right total hip arthroplasty. EXAM: PORTABLE PELVIS 1-2 VIEWS COMPARISON:  Radiographs dated 08/27/2016 FINDINGS: Right total hip prosthesis appears in excellent position in the AP projection. No fractures. Bones of the pelvis and left hip appear normal. IMPRESSION: Satisfactory appearance in the AP projection of the right hip and pelvis after total hip arthroplasty. Electronically Signed   By: Francene Boyers M.D.   On: 08/13/2018 10:52   Dg Hip Operative Unilat W Or W/o Pelvis Right  Result Date: 08/13/2018 CLINICAL DATA:  Total hip arthroplasty. FLUOROSCOPY TIME:  19 seconds. Images: 7 EXAM: OPERATIVE RIGHT HIP (WITH PELVIS IF PERFORMED) 7 VIEWS TECHNIQUE: Fluoroscopic spot image(s)  were submitted for interpretation post-operatively. COMPARISON:  None. FINDINGS: By the end of the study, the patient is status post right hip arthroplasty. Acetabular and femoral components are in good position. IMPRESSION: Right hip arthroplasty as above. Electronically Signed   By: Gerome Sam III M.D   On: 08/13/2018 10:46    Assessment/Plan: 2 Days Post-Op   Active Problems:   Status post THR (total hip replacement)   Advance diet D/C IV fluids Discharge home with home health   Altamese Cabal , PA-C 08/15/2018, 6:39 AM

## 2018-08-15 NOTE — Progress Notes (Signed)
Discharge instructions given to patient with verbalization of understanding. Scripts were sent by MD to the pharmacy on file. Patients states that she does not take the oxycodone and would rather take tramadol. Dr. Odis Luster notified and he will call prescription in for Tramadol in about an hour.

## 2018-08-15 NOTE — Telephone Encounter (Signed)
Yes she should stop the amlodipine and increase her water or gatorade intake to a minimum of 60 ounces daily .  Does she have a home health RN who can take an order to  check a bmet and  CBC today or Monday?

## 2018-08-15 NOTE — Care Management (Addendum)
Per RNCM note patient will need 3 in 1 commode. [facility].  I have [facility] [place] [facility] that patient would be discharged to home today.  RNCM will follow up with patient. Message sent to MD regarding DME Rx and home health PT. Update: RNCM spoke with patient and she is now requesting a front-wheeled walker which has been requested from Nida Boat[facility]Brad and MD. [facility]. No other RNCM needs.

## 2018-08-15 NOTE — Discharge Summary (Signed)
Physician Discharge Summary  Patient ID: Mary Vasquez MRN: [id number] DOB/AGE: [birth year] 78 y.o.  Admit date: 08/13/2018 Discharge date: 08/15/2018  Admission Diagnoses:  OSTEOARTHRITIS OF HIP <principal problem not specified>  Discharge Diagnoses:  OSTEOARTHRITIS OF HIP Active Problems:   Status post THR (total hip replacement)   Past Medical History:  Diagnosis Date  . Arthritis   . Depression    situational  . History of kidney stones   . Hyperlipidemia   . Hypertension   . Hypothyroidism     Surgeries: Procedure(s): TOTAL HIP ARTHROPLASTY ANTERIOR APPROACH on 08/13/2018   Consultants (if any):   Discharged Condition: Improved  Hospital Course: Mary Vasquez is an 78 y.o. female who was admitted 08/13/2018 with a diagnosis of  OSTEOARTHRITIS OF HIP <principal problem not specified> and went to the operating room on 08/13/2018 and underwent the above named procedures.    She was given perioperative antibiotics:  Anti-infectives (From admission, onward)   Start     Dose/Rate Route Frequency Ordered Stop   08/13/18 1400  clindamycin (CLEOCIN) IVPB 600 mg     600 mg 100 mL/hr over 30 Minutes Intravenous Every 6 hours 08/13/18 1051 08/13/18 2222   08/13/18 0834  50,000 units bacitracin in 0.9% normal saline 250 mL irrigation  Status:  Discontinued       As needed 08/13/18 0834 08/13/18 1004   08/13/18 0600  clindamycin (CLEOCIN) IVPB 900 mg     900 mg 100 mL/hr over 30 Minutes Intravenous On call to O.R. 08/12/18 1946 08/13/18 0802   08/13/18 0546  clindamycin (CLEOCIN) 900 MG/50ML IVPB    Note to Pharmacy:  Mary Vasquez  : cabinet override      08/13/18 0546 08/13/18 0802    .  She was given sequential compression devices, early ambulation, and aspirin for DVT prophylaxis.  She benefited maximally from the hospital stay and there were no complications.    Recent vital signs:  Vitals:   08/15/18 0049 08/15/18 0745  BP:  105/64  Pulse:  78  Resp:  16  Temp:  98.7 F (37.1 C) 98.6 F (37 C)  SpO2:  97%    Recent laboratory studies:  Lab Results  Component Value Date   HGB 10.8 (L) 08/15/2018   HGB 10.3 (L) 08/14/2018   HGB 13.0 07/31/2018   Lab Results  Component Value Date   WBC 8.2 08/15/2018   PLT 203 08/15/2018   Lab Results  Component Value Date   INR 1.11 07/31/2018   Lab Results  Component Value Date   NA 135 08/14/2018   K 4.1 08/14/2018   CL 104 08/14/2018   CO2 25 08/14/2018   BUN 18 08/14/2018   CREATININE 0.90 08/14/2018   GLUCOSE 99 08/14/2018    Discharge Medications:   Allergies as of 08/15/2018      Reactions   Hydrocodone Nausea And Vomiting   Penicillin G Hives, Other (See Comments)   Only had reaction to Augmentin DID THE REACTION INVOLVE: Swelling of the face/tongue/throat, SOB, or low BP? Unknown Sudden or severe rash/hives, skin peeling, or the inside of the mouth or nose? Unknown Did it require medical treatment? Unknown When did it last happen? Unknown If all above answers are "NO", may proceed with cephalosporin use.   Augmentin [amoxicillin-pot Clavulanate] Hives      Medication List    STOP taking these medications   celecoxib 200 MG capsule Commonly known as:  CELEBREX   traMADol 50 MG tablet  Commonly known as:  ULTRAM   TURMERIC PO     TAKE these medications   acetaminophen 500 MG tablet Commonly known as:  TYLENOL Take 1,000 mg by mouth every 6 (six) hours as needed for moderate pain or headache.   amLODipine 5 MG tablet Commonly known as:  NORVASC TAKE 1 TABLET BY MOUTH EVERY DAY   aspirin 81 MG chewable tablet Chew 1 tablet (81 mg total) by mouth 2 (two) times daily.   docusate sodium 100 MG capsule Commonly known as:  COLACE Take 1 capsule (100 mg total) by mouth 2 (two) times daily.   levothyroxine 25 MCG tablet Commonly known as:  SYNTHROID, LEVOTHROID TAKE 1 TABLET BY MOUTH DAILY BEFORE BREAKFAST EXCEPT ON SUNDAY AND THURSDAY TAKE 2 TABS   oxyCODONE 5 MG  immediate release tablet Commonly known as:  Oxy IR/ROXICODONE Take 1 tablet (5 mg total) by mouth every 4 (four) hours as needed for moderate pain.   Red Yeast Rice 600 MG Caps Take 1 capsule (600 mg total) by mouth 2 (two) times daily.   Vitamin D3 25 MCG (1000 UT) Caps Take 1,000 Units by mouth 2 (two) times daily.       Diagnostic Studies: Dg Chest 2 View  Result Date: 07/28/2018 CLINICAL DATA:  Pre-op respiratory exam EXAM: CHEST - 2 VIEW COMPARISON:  None. FINDINGS: The heart size and mediastinal contours are within normal limits. Both lungs are clear. No evidence of pneumothorax or pleural effusion. Thoracolumbar spine levoscoliosis noted. IMPRESSION: No active cardiopulmonary disease. Electronically Signed   By: Myles RosenthalJohn  Stahl M.D.   On: 07/28/2018 15:28   Dg Pelvis Portable  Result Date: 08/13/2018 CLINICAL DATA:  Osteoarthritis of the right hip. Status post right total hip arthroplasty. EXAM: PORTABLE PELVIS 1-2 VIEWS COMPARISON:  Radiographs dated 08/27/2016 FINDINGS: Right total hip prosthesis appears in excellent position in the AP projection. No fractures. Bones of the pelvis and left hip appear normal. IMPRESSION: Satisfactory appearance in the AP projection of the right hip and pelvis after total hip arthroplasty. Electronically Signed   By: Francene BoyersJames  Maxwell M.D.   On: 08/13/2018 10:52   Dg Hip Operative Unilat W Or W/o Pelvis Right  Result Date: 08/13/2018 CLINICAL DATA:  Total hip arthroplasty. FLUOROSCOPY TIME:  19 seconds. Images: 7 EXAM: OPERATIVE RIGHT HIP (WITH PELVIS IF PERFORMED) 7 VIEWS TECHNIQUE: Fluoroscopic spot image(s) were submitted for interpretation post-operatively. COMPARISON:  None. FINDINGS: By the end of the study, the patient is status post right hip arthroplasty. Acetabular and femoral components are in good position. IMPRESSION: Right hip arthroplasty as above. Electronically Signed   By: Gerome Samavid  Williams III M.D   On: 08/13/2018 10:46    Disposition:  Discharge disposition: 01-Home or Self Care         Contact information for after-discharge care    Destination    [facility] Preferred SNF .   Service:  Skilled Nursing Contact information: [address] [place] [phone]               Signed: Lyndle HerrlichJames R Traci Gafford ,MD 08/15/2018, 9:39 AM

## 2018-08-15 NOTE — Progress Notes (Signed)
Physical Therapy Treatment Patient Details Name: Mary Vasquez MRN: [id number] DOB: Nov 19, 1940 Today's Date: 08/15/2018    History of Present Illness Pt is a 78 y/o F s/p R THA. Pt has been orthostatic during hospital stay.     PT Comments    Pt made excellent progress toward her mobility goals this session, thus follow up recommendations were updated to HHPT, CM and SW made aware.  Pt ambulated around nurses station (200 ft) with RW and completed stair training this session.  Pt with +orthostatics this session from sit>stand but asymptomatic; however, pt becomes dizzy x2 this session, requiring a brief sitting rest break each time (~1 minute).  Spent an extensive amount of time emphasizing the importance of sitting or lying down if she becomes dizzy with activity at home. Instructed pt to have family place a chair in each room of her home in case she becomes dizzy with activity. Pt demonstrated very good safety awareness with this during today's session, notifying this PT when she became even slightly dizzy.  Pt verbalized understanding of this safety precaution.    Follow Up Recommendations  Home health PT;Supervision for mobility/OOB     Equipment Recommendations  Other (comment)(shower seat)    Recommendations for Other Services       Precautions / Restrictions Precautions Precautions: Fall;Other (comment)(+orthostatic) Precaution Comments: direct anterior, no hip precautions Restrictions Weight Bearing Restrictions: Yes RLE Weight Bearing: Weight bearing as tolerated    Mobility  Bed Mobility Overal bed mobility: Needs Assistance Bed Mobility: Supine to Sit     Supine to sit: Supervision;HOB elevated     General bed mobility comments: Supervision for safety.  Pt requries increased time but no physical assist or cues.  No use of bed rail.   Transfers Overall transfer level: Needs assistance Equipment used: Rolling walker (2 wheeled) Transfers: Sit to/from Stand Sit to  Stand: Min guard         General transfer comment: Pt demonstrated proper hand placement consistently.  Min guard as pt +orthostatic.  Pt denies dizziness with sit>stand this session.   Ambulation/Gait Ambulation/Gait assistance: Min guard Gait Distance (Feet): 200 Feet Assistive device: Rolling walker (2 wheeled) Gait Pattern/deviations: Decreased weight shift to right;Antalgic     General Gait Details: Pt ambulated a total of 200 ft, min guard provided for safety as pt +orthostatic.  Pt remained steady using RW.     Stairs Stairs: Yes Stairs assistance: Supervision Stair Management: One rail Left;Two rails;Step to pattern;Sideways;Forwards Number of Stairs: 4(x2) General stair comments: Pt performed stair training x2, once to simulate entrance to home and once to simulate steps to get upstairs.  Pt first ascended/descended 4 steps with Bil rails with cues for sequencing.  Next, pt ascended/descended steps sideways with cues for sequencing.  Supervision during both stair training attempts.  Pt remains steady throughout.    Wheelchair Mobility    Modified Rankin (Stroke Patients Only)       Balance Overall balance assessment: Needs assistance Sitting-balance support: No upper extremity supported;Feet supported Sitting balance-Leahy Scale: Good     Standing balance support: Bilateral upper extremity supported;During functional activity Standing balance-Leahy Scale: Fair Standing balance comment: Pt able to stand statically without UE support but relies on RW to ambulate                            Cognition Arousal/Alertness: Awake/alert Behavior During Therapy: WFL for tasks assessed/performed Overall Cognitive Status: Within Functional  Limits for tasks assessed                                        Exercises Total Joint Exercises Marching in Standing: Both;10 reps;Standing Other Exercises Other Exercises: Spent an extensive amount of  time emphasizing the importance of sitting or lying down if she becomes dizzy with activity at home.  Pt demonstrated very good safety awareness with this during today's session, notifying this PT when she became even slightly dizzy.  Pt verbalized understanding of this safety precaution.     General Comments General comments (skin integrity, edema, etc.): BP in supine 139/66, sitting EOB 132/76, standing at bedside 106/76, pt asymptomatic.  Pt then ambulated to the bathroom.  Following bathroom the pt reported mild dizziness and was instructed to sit with BP in sitting 126/79.  Symptoms resolved and pt ambulated to the stairs and performed stair training.  Following stair training pt reported mild dizziness and was instructed to sit.  BP in sitting 123/63.  Symptoms resolved and pt ambulated around nurses station remaining asymptomatic.       Pertinent Vitals/Pain Pain Assessment: Faces Faces Pain Scale: Hurts little more Pain Location: R hip Pain Descriptors / Indicators: Guarding;Sore Pain Intervention(s): Limited activity within patient's tolerance;Monitored during session;Repositioned    Home Living                      Prior Function            PT Goals (current goals can now be found in the care plan section) Acute Rehab PT Goals Patient Stated Goal: to go home today PT Goal Formulation: With patient Time For Goal Achievement: 08/27/18 Potential to Achieve Goals: Good Progress towards PT goals: Progressing toward goals    Frequency    BID      PT Plan Discharge plan needs to be updated    Co-evaluation              AM-PAC PT "6 Clicks" Mobility   Outcome Measure  Help needed turning from your back to your side while in a flat bed without using bedrails?: A Little Help needed moving from lying on your back to sitting on the side of a flat bed without using bedrails?: A Little Help needed moving to and from a bed to a chair (including a wheelchair)?: A  Little Help needed standing up from a chair using your arms (e.g., wheelchair or bedside chair)?: A Little Help needed to walk in hospital room?: A Little Help needed climbing 3-5 steps with a railing? : A Little 6 Click Score: 18    End of Session Equipment Utilized During Treatment: Gait belt Activity Tolerance: Treatment limited secondary to medical complications (Comment)(rest breaks due to dizziness) Patient left: with call bell/phone within reach;in chair;with chair alarm set Nurse Communication: Mobility status;Other (comment)(+orthostatics) PT Visit Diagnosis: Pain;Unsteadiness on feet (R26.81);Other abnormalities of gait and mobility (R26.89);Muscle weakness (generalized) (M62.81) Pain - Right/Left: Right Pain - part of body: Hip     Time: 0832-0907 PT Time Calculation (min) (ACUTE ONLY): 35 min  Charges:  $Gait Training: 23-37 mins                     Encarnacion ChuAshley Abashian PT, DPT 08/15/2018, 9:35 AM

## 2018-08-15 NOTE — Telephone Encounter (Signed)
Patient called in and says that she was just discharged from the hospital today from having hip surgery and the therapist checked her BP, it was 101/63 sitting and standing it was 95/65. She says she was not dizzy. She says in the hospital, she was having a hard time with doing her therapy because her BP would drop when she stood up and she would get dizzy, nauseated. She says none of that happened today. She says she wants to know if Dr. Darrick Huntsman wants her to stop taking the Amlodipine while her BP is that low. I advised I will send this over to her for review and someone will call with her recommendation. Hospital follow up appointment scheduled at the first available on Friday, 09/05/18 at 1100 with Dr. Darrick Huntsman.  Reason for Disposition . [1] Systolic BP 90-110 AND [2] taking blood pressure medications AND [3] NOT dizzy, lightheaded or weak  Answer Assessment - Initial Assessment Questions 1. BLOOD PRESSURE: "What is the blood pressure?" "Did you take at least two measurements 5 minutes apart?"    Sitting BP 101/63; Standing 95/65 2. ONSET: "When did you take your blood pressure?"     This morning taken by the PT 3. HOW: "How did you obtain the blood pressure?" (e.g., visiting nurse, automatic home BP monitor)     Visiting therapist meter 4. HISTORY: "Do you have a history of low blood pressure?" "What is your blood pressure normally?"     No 5. MEDICATIONS: "Are you taking any medications for blood pressure?" If yes: "Have they been changed recently?"     Yes 6. PULSE RATE: "Do you know what your pulse rate is?"      N/A 7. OTHER SYMPTOMS: "Have you been sick recently?" "Have you had a recent injury?"     Had surgery on Wednesday 8. PREGNANCY: "Is there any chance you are pregnant?" "When was your last menstrual period?"     No  Protocols used: LOW BLOOD PRESSURE-A-AH

## 2018-08-15 NOTE — Progress Notes (Signed)
PT is recommending home health today. Clinical Social Worker (CSW) met with patient to discuss D/C plan. Patient is agreeable to D/C home today. RN case manager aware of above. [facility] liaison is aware of above. Health Team case manager is aware of above. Please reconsult if future social work needs arise. CSW signing off.   Mary Sample, LCSW [phone]  

## 2018-08-15 NOTE — Telephone Encounter (Signed)
Spoke with pt and she stated that she is getting Home Health through [facility]. I will contact them on Monday to see if they lcan go out and draw a CBC and a BMET.

## 2018-08-15 NOTE — Discharge Instructions (Signed)

## 2018-08-15 NOTE — Care Management Important Message (Signed)
Important Message  Patient Details  Name: Mary Vasquez MRN: [id number] Date of Birth: Apr 27, 1941   Medicare Important Message Given:  Yes    [name] A [name] 08/15/2018, 10:18 AM

## 2018-08-18 ENCOUNTER — Telehealth: Payer: Self-pay | Admitting: Internal Medicine

## 2018-08-18 NOTE — Telephone Encounter (Signed)
Verbal orders given  

## 2018-08-18 NOTE — Telephone Encounter (Signed)
Spoke with Tammy Sours form [facility] and gave him the verbal orders for a CBC and BMET.

## 2018-08-18 NOTE — Telephone Encounter (Unsigned)
Copied from CRM [id number]. Topic: Quick Communication - Home Health Verbal Orders >> Aug 18, 2018  9:46 AM Wyonia Hough E wrote: Caller/Agency: [facility]  Callback Number: [phone] Requesting: Nurse verbal order to draw a CBC and a BMET.

## 2018-08-19 DIAGNOSIS — Z4789 Encounter for other orthopedic aftercare: Secondary | ICD-10-CM | POA: Diagnosis not present

## 2018-09-05 ENCOUNTER — Encounter: Payer: Self-pay | Admitting: Internal Medicine

## 2018-09-05 ENCOUNTER — Ambulatory Visit (INDEPENDENT_AMBULATORY_CARE_PROVIDER_SITE_OTHER): Payer: PPO | Admitting: Internal Medicine

## 2018-09-05 VITALS — BP 142/88 | HR 73 | Temp 98.1°F | Resp 15 | Ht 61.0 in | Wt 155.6 lb

## 2018-09-05 DIAGNOSIS — I10 Essential (primary) hypertension: Secondary | ICD-10-CM

## 2018-09-05 DIAGNOSIS — E663 Overweight: Secondary | ICD-10-CM | POA: Diagnosis not present

## 2018-09-05 DIAGNOSIS — D62 Acute posthemorrhagic anemia: Secondary | ICD-10-CM | POA: Diagnosis not present

## 2018-09-05 DIAGNOSIS — N183 Chronic kidney disease, stage 3 unspecified: Secondary | ICD-10-CM

## 2018-09-05 DIAGNOSIS — E034 Atrophy of thyroid (acquired): Secondary | ICD-10-CM

## 2018-09-05 LAB — CBC WITH DIFFERENTIAL/PLATELET
BASOS PCT: 1.1 % (ref 0.0–3.0)
Basophils Absolute: 0.1 10*3/uL (ref 0.0–0.1)
Eosinophils Absolute: 0.1 10*3/uL (ref 0.0–0.7)
Eosinophils Relative: 2.2 % (ref 0.0–5.0)
HCT: 37 % (ref 36.0–46.0)
Hemoglobin: 12.5 g/dL (ref 12.0–15.0)
Lymphocytes Relative: 26.2 % (ref 12.0–46.0)
Lymphs Abs: 1.5 10*3/uL (ref 0.7–4.0)
MCHC: 33.7 g/dL (ref 30.0–36.0)
MCV: 92.8 fl (ref 78.0–100.0)
Monocytes Absolute: 0.6 10*3/uL (ref 0.1–1.0)
Monocytes Relative: 10 % (ref 3.0–12.0)
Neutro Abs: 3.5 10*3/uL (ref 1.4–7.7)
Neutrophils Relative %: 60.5 % (ref 43.0–77.0)
Platelets: 291 10*3/uL (ref 150.0–400.0)
RBC: 3.98 Mil/uL (ref 3.87–5.11)
RDW: 12.9 % (ref 11.5–15.5)
WBC: 5.8 10*3/uL (ref 4.0–10.5)

## 2018-09-05 LAB — IBC + FERRITIN
Ferritin: 118.6 ng/mL (ref 10.0–291.0)
Iron: 69 ug/dL (ref 42–145)
Saturation Ratios: 20.7 % (ref 20.0–50.0)
Transferrin: 238 mg/dL (ref 212.0–360.0)

## 2018-09-05 NOTE — Progress Notes (Signed)
Subjective:  Patient ID: Mary Vasquez, female    DOB: Nov 08, 1940  Age: 78 y.o. MRN: [id number]  CC: The primary encounter diagnosis was Acute blood loss as cause of postoperative anemia. Diagnoses of Essential hypertension, CKD (chronic kidney disease) stage 3, GFR 30-59 ml/min (HCC), Overweight (BMI 25.0-29.9), Hypothyroidism due to acquired atrophy of thyroid, and Postoperative anemia due to acute blood loss were also pertinent to this visit.  HPI Mary Vasquez presents for hospital follow up.  Patient was admitted to   [facility] on Feb 5 for elective right hip replacement by Cassell Smiles.   The surgery was uncomplicated and she was discharged on Feb 7 to home with Home Health care provided by [facility].  the following medications were newly prescribed or discontinued:  Amlodipine was suspended due to hypotension  Hgb dropped from 13 to 10.5 post operatively.   Using Tylenol and tramadol for pain control.   Had recurrent pain due to a few days of increased physical activity  Drop in hgb from 13 pre op to 10.3 post op   Eating twice daily,  Late morning and late afternoon .  Losing weight   Intentionally (semi)     Outpatient Medications Prior to Visit  Medication Sig Dispense Refill  . acetaminophen (TYLENOL) 500 MG tablet Take 1,000 mg by mouth every 6 (six) hours as needed for moderate pain or headache.    Marland Kitchen aspirin 81 MG chewable tablet Chew 1 tablet (81 mg total) by mouth 2 (two) times daily. 60 tablet 0  . Cholecalciferol (VITAMIN D3) 25 MCG (1000 UT) CAPS Take 1,000 Units by mouth 2 (two) times daily.    Marland Kitchen levothyroxine (SYNTHROID, LEVOTHROID) 25 MCG tablet TAKE 1 TABLET BY MOUTH DAILY BEFORE BREAKFAST EXCEPT ON SUNDAY AND THURSDAY TAKE 2 TABS 114 tablet 1  . Red Yeast Rice 600 MG CAPS Take 1 capsule (600 mg total) by mouth 2 (two) times daily. 180 capsule 1  . traMADol (ULTRAM) 50 MG tablet Take 50 mg by mouth every 6 (six) hours as needed.    Marland Kitchen amLODipine (NORVASC) 5 MG  tablet TAKE 1 TABLET BY MOUTH EVERY DAY (Patient not taking: Reported on 09/05/2018) 90 tablet 1  . docusate sodium (COLACE) 100 MG capsule Take 1 capsule (100 mg total) by mouth 2 (two) times daily. (Patient not taking: Reported on 09/05/2018) 10 capsule 0  . oxyCODONE (OXY IR/ROXICODONE) 5 MG immediate release tablet Take 1 tablet (5 mg total) by mouth every 4 (four) hours as needed for moderate pain. (Patient not taking: Reported on 09/05/2018) 30 tablet 0   No facility-administered medications prior to visit.     Review of Systems;  Patient denies headache, fevers, malaise, unintentional weight loss, skin rash, eye pain, sinus congestion and sinus pain, sore throat, dysphagia,  hemoptysis , cough, dyspnea, wheezing, chest pain, palpitations, orthopnea, edema, abdominal pain, nausea, melena, diarrhea, constipation, flank pain, dysuria, hematuria, urinary  Frequency, nocturia, numbness, tingling, seizures,  Focal weakness, Loss of consciousness,  Tremor, insomnia, depression, anxiety, and suicidal ideation.      Objective:  BP (!) 142/88 (BP Location: Left Arm, Patient Position: Sitting, Cuff Size: Normal)   Pulse 73   Temp 98.1 F (36.7 C) (Oral)   Resp 15   Ht  (1.549 m)   Wt 155 lb 9.6 oz (70.6 kg)   SpO2 99%   BMI 29.40 kg/m   BP Readings from Last 3 Encounters:  09/05/18 (!) 142/88  08/15/18 105/64  07/31/18 131/76    Wt Readings from Last 3 Encounters:  09/05/18 155 lb 9.6 oz (70.6 kg)  08/13/18 157 lb 9 oz (71.5 kg)  07/31/18 157 lb 9 oz (71.5 kg)    General appearance: alert, cooperative and appears stated age Ears: normal TM's and external ear canals both ears Throat: lips, mucosa, and tongue normal; teeth and gums normal Neck: no adenopathy, no carotid bruit, supple, symmetrical, trachea midline and thyroid not enlarged, symmetric, no tenderness/mass/nodules Back: symmetric, no curvature. ROM normal. No CVA tenderness. Lungs: clear to auscultation  bilaterally Heart: regular rate and rhythm, S1, S2 normal, no murmur, click, rub or gallop Abdomen: soft, non-tender; bowel sounds normal; no masses,  no organomegaly Pulses: 2+ and symmetric Skin: Skin color, texture, turgor normal. No rashes or lesions Lymph nodes: Cervical, supraclavicular, and axillary nodes normal.  Lab Results  Component Value Date   HGBA1C 5.7 12/06/2017   HGBA1C 5.8 01/24/2017   HGBA1C 5.8 09/09/2015    Lab Results  Component Value Date   CREATININE 0.90 08/14/2018   CREATININE 1.13 07/28/2018   CREATININE 1.22 (H) 05/19/2018    Lab Results  Component Value Date   WBC 5.8 09/05/2018   HGB 12.5 09/05/2018   HCT 37.0 09/05/2018   PLT 291.0 09/05/2018   GLUCOSE 99 08/14/2018   CHOL 230 (H) 03/12/2018   TRIG 118.0 03/12/2018   HDL 63.70 03/12/2018   LDLDIRECT 91.0 09/09/2015   LDLCALC 143 (H) 03/12/2018   ALT 15 07/28/2018   AST 27 07/28/2018   NA 135 08/14/2018   K 4.1 08/14/2018   CL 104 08/14/2018   CREATININE 0.90 08/14/2018   BUN 18 08/14/2018   CO2 25 08/14/2018   TSH 2.80 12/06/2017   INR 1.11 07/31/2018   HGBA1C 5.7 12/06/2017   MICROALBUR <0.7 12/10/2017    No results found.  Assessment & Plan:   Problem List Items Addressed This Visit    Postoperative anemia due to acute blood loss    Resolved with repeat labs . Iron stores are normal.   Lab Results  Component Value Date   WBC 5.8 09/05/2018   HGB 12.5 09/05/2018   HCT 37.0 09/05/2018   MCV 92.8 09/05/2018   PLT 291.0 09/05/2018   Lab Results  Component Value Date   IRON 69 09/05/2018   FERRITIN 118.6 09/05/2018         Overweight (BMI 25.0-29.9)    I have congratulated her in reduction of   BMI and encouraged  Continued weight loss with goal of 10% of body weigh over the next 6 months using a low glycemic index diet and regular exercise a minimum of 5 days per week.        Hypothyroidism    Thyroid function  Is overdue .     Lab Results  Component Value  Date   TSH 2.80 12/06/2017         Relevant Orders   TSH   RESOLVED: Essential hypertension    Med was suspended post operatively due to hypotension .  Advised to resume amlodipine at 2.5 mg daily once home readings are persistently > 140      Relevant Orders   Lipid panel   CKD (chronic kidney disease) stage 3, GFR 30-59 ml/min (HCC)    Cr normalized after suspension of NSAIDs.   Lab Results  Component Value Date   CREATININE 0.90 08/14/2018          Other Visit Diagnoses  Acute blood loss as cause of postoperative anemia    -  Primary   Relevant Orders   CBC with Differential/Platelet (Completed)   IBC + Ferritin (Completed)      I have discontinued Liora C. Blanton's oxyCODONE. I am also having her maintain her Red Yeast Rice, Vitamin D3, acetaminophen, levothyroxine, amLODipine, aspirin, docusate sodium, and traMADol.  No orders of the defined types were placed in this encounter.   Medications Discontinued During This Encounter  Medication Reason  . oxyCODONE (OXY IR/ROXICODONE) 5 MG immediate release tablet Prescription never filled    Follow-up: Return in about 6 months (around 03/06/2019).   Sherlene Shams, MD

## 2018-09-05 NOTE — Patient Instructions (Addendum)
Do not resume amlodipine until your BP at home is persistently > 150/90  If so,  Resume at 2.5 mg daily in the evening    If your blood pressure is not < 140/80 after one week of 2.5 mg ,  Increase dose to 5 mg   (our goal is to get your BP down to  130/80)

## 2018-09-07 ENCOUNTER — Encounter: Payer: Self-pay | Admitting: Internal Medicine

## 2018-09-07 DIAGNOSIS — D62 Acute posthemorrhagic anemia: Secondary | ICD-10-CM | POA: Insufficient documentation

## 2018-09-07 NOTE — Assessment & Plan Note (Addendum)
Med was suspended post operatively due to hypotension .  Advised to resume amlodipine at 2.5 mg daily once home readings are persistently > 140

## 2018-09-07 NOTE — Assessment & Plan Note (Signed)
Thyroid function  Is overdue .     Lab Results  Component Value Date   TSH 2.80 12/06/2017

## 2018-09-07 NOTE — Assessment & Plan Note (Signed)
I have congratulated her in reduction of   BMI and encouraged  Continued weight loss with goal of 10% of body weigh over the next 6 months using a low glycemic index diet and regular exercise a minimum of 5 days per week.    

## 2018-09-07 NOTE — Assessment & Plan Note (Signed)
Cr normalized after suspension of NSAIDs.   Lab Results  Component Value Date   CREATININE 0.90 08/14/2018

## 2018-09-07 NOTE — Assessment & Plan Note (Signed)
Resolved with repeat labs . Iron stores are normal.   Lab Results  Component Value Date   WBC 5.8 09/05/2018   HGB 12.5 09/05/2018   HCT 37.0 09/05/2018   MCV 92.8 09/05/2018   PLT 291.0 09/05/2018   Lab Results  Component Value Date   IRON 69 09/05/2018   FERRITIN 118.6 09/05/2018

## 2018-09-09 DIAGNOSIS — M25651 Stiffness of right hip, not elsewhere classified: Secondary | ICD-10-CM | POA: Diagnosis not present

## 2018-09-09 DIAGNOSIS — M25551 Pain in right hip: Secondary | ICD-10-CM | POA: Diagnosis not present

## 2018-09-11 DIAGNOSIS — M25551 Pain in right hip: Secondary | ICD-10-CM | POA: Diagnosis not present

## 2018-09-11 DIAGNOSIS — M25651 Stiffness of right hip, not elsewhere classified: Secondary | ICD-10-CM | POA: Diagnosis not present

## 2018-09-15 DIAGNOSIS — M25651 Stiffness of right hip, not elsewhere classified: Secondary | ICD-10-CM | POA: Diagnosis not present

## 2018-09-15 DIAGNOSIS — M25551 Pain in right hip: Secondary | ICD-10-CM | POA: Diagnosis not present

## 2018-09-22 DIAGNOSIS — M25551 Pain in right hip: Secondary | ICD-10-CM | POA: Diagnosis not present

## 2018-09-22 DIAGNOSIS — M25651 Stiffness of right hip, not elsewhere classified: Secondary | ICD-10-CM | POA: Diagnosis not present

## 2018-09-23 DIAGNOSIS — Z96641 Presence of right artificial hip joint: Secondary | ICD-10-CM | POA: Diagnosis not present

## 2019-01-24 ENCOUNTER — Encounter: Payer: Self-pay | Admitting: Emergency Medicine

## 2019-01-24 ENCOUNTER — Emergency Department
Admission: EM | Admit: 2019-01-24 | Discharge: 2019-01-24 | Disposition: A | Discharge status: Home / Self Care | Payer: PPO | Attending: Emergency Medicine | Admitting: Emergency Medicine

## 2019-01-24 ENCOUNTER — Other Ambulatory Visit: Payer: Self-pay

## 2019-01-24 DIAGNOSIS — Z79899 Other long term (current) drug therapy: Secondary | ICD-10-CM | POA: Diagnosis not present

## 2019-01-24 DIAGNOSIS — T7840XA Allergy, unspecified, initial encounter: Secondary | ICD-10-CM | POA: Insufficient documentation

## 2019-01-24 DIAGNOSIS — N183 Chronic kidney disease, stage 3 (moderate): Secondary | ICD-10-CM | POA: Insufficient documentation

## 2019-01-24 DIAGNOSIS — R220 Localized swelling, mass and lump, head: Secondary | ICD-10-CM | POA: Diagnosis present

## 2019-01-24 DIAGNOSIS — I129 Hypertensive chronic kidney disease with stage 1 through stage 4 chronic kidney disease, or unspecified chronic kidney disease: Secondary | ICD-10-CM | POA: Insufficient documentation

## 2019-01-24 DIAGNOSIS — Z7982 Long term (current) use of aspirin: Secondary | ICD-10-CM | POA: Diagnosis not present

## 2019-01-24 DIAGNOSIS — Z96641 Presence of right artificial hip joint: Secondary | ICD-10-CM | POA: Diagnosis not present

## 2019-01-24 DIAGNOSIS — E039 Hypothyroidism, unspecified: Secondary | ICD-10-CM | POA: Diagnosis not present

## 2019-01-24 MED ORDER — TRIAMCINOLONE ACETONIDE 0.5 % EX OINT: 1.0000 "application " | TOPICAL_OINTMENT | Freq: Two times a day (BID) | CUTANEOUS | 0 refills | Status: DC

## 2019-01-24 MED ORDER — DEXAMETHASONE SODIUM PHOSPHATE 10 MG/ML IJ SOLN
10.0000 mg | Freq: Once | INTRAMUSCULAR | Status: AC
  Administered 2019-01-24: 10 mg via INTRAMUSCULAR
  Filled 2019-01-24: qty 1

## 2019-01-24 NOTE — ED Provider Notes (Signed)
[facility] Emergency Department Provider Note  ____________________________________________  Time seen: Approximately 3:58 PM  I have reviewed the triage vital signs and the nursing notes.   HISTORY  Chief Complaint Allergic Reaction   HPI Mary Vasquez is a 78 y.o. female who presents to the emergency department for treatment and evaluation of redness to her face with swelling, itching, and burning for the past week.  She has not used any new creams or make-up on her face.  She states that this morning she woke up and her right was very swollen.  She denies any shortness of breath or difficulty swallowing or speaking.  She is taking some Benadryl without much relief.   Past Medical History:  Diagnosis Date  . Arthritis   . Depression    situational  . History of kidney stones   . Hyperlipidemia   . Hypertension   . Hypothyroidism     Patient Active Problem List   Diagnosis Date Noted  . Postoperative anemia due to acute blood loss 09/07/2018  . Status post THR (total hip replacement) 08/13/2018  . CKD (chronic kidney disease) stage 3, GFR 30-59 ml/min (HCC) 07/29/2018  . Hypothyroidism 12/09/2016  . Adjustment disorder 12/08/2016  . Rash and nonspecific skin eruption 06/14/2014  . Overweight (BMI 25.0-29.9) 04/25/2013  . Epidemic vertigo 04/25/2013  . 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 Hyperlipidemia with target LDL less than 100 02/21/2012    Past Surgical History:  Procedure Laterality Date  . BREAST BIOPSY Left 2009   CORE - NEG...marker in breast  . BREAST SURGERY  2009   stereotactic,  left,  benign   . HYSTEROSCOPY  2008  . ROTATOR CUFF REPAIR Right 2009   [facility]  . 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   Procedure: TOTAL HIP ARTHROPLASTY ANTERIOR APPROACH;  Surgeon: Lovell Sheehan, MD;  Location: [facility] ORS;  Service: Orthopedics;  Laterality: Right;    Prior to Admission medications   Medication Sig Start Date End Date  Taking? Authorizing Provider  acetaminophen (TYLENOL) 500 MG tablet Take 1,000 mg by mouth every 6 (six) hours as needed for moderate pain or headache.    [provider]  amLODipine (NORVASC) 5 MG tablet TAKE 1 TABLET BY MOUTH EVERY DAY Patient not taking: Reported on 09/05/2018 08/05/18   Crecencio Mc, MD  aspirin 81 MG chewable tablet Chew 1 tablet (81 mg total) by mouth 2 (two) times daily. 08/15/18   Lovell Sheehan, MD  Cholecalciferol (VITAMIN D3) 25 MCG (1000 UT) CAPS Take 1,000 Units by mouth 2 (two) times daily.    [provider]  docusate sodium (COLACE) 100 MG capsule Take 1 capsule (100 mg total) by mouth 2 (two) times daily. Patient not taking: Reported on 09/05/2018 08/15/18   Lovell Sheehan, MD  levothyroxine (SYNTHROID, LEVOTHROID) 25 MCG tablet TAKE 1 TABLET BY MOUTH DAILY BEFORE BREAKFAST EXCEPT ON SUNDAY AND THURSDAY TAKE 2 TABS 08/01/18   Crecencio Mc, MD  Red Yeast Rice 600 MG CAPS Take 1 capsule (600 mg total) by mouth 2 (two) times daily. 01/28/17   Crecencio Mc, MD  traMADol (ULTRAM) 50 MG tablet Take 50 mg by mouth every 6 (six) hours as needed. 08/26/18   [provider]  triamcinolone ointment (KENALOG) 0.5 % Apply 1 application topically 2 (two) times daily. 01/24/19   Toriana Sponsel, Johnette Abraham B, FNP    Allergies Hydrocodone, Penicillin g, and Augmentin [amoxicillin-pot clavulanate]  Family History  Problem Relation  Age of Onset  . Heart disease Mother [id number]       ami  . Heart disease Father        ami  . Diabetes Brother   . Cancer Brother        prostate ca  . Breast cancer Paternal Aunt        50'S  . Breast cancer Cousin     Social History Social History   Tobacco Use  . Smoking status: Never Smoker  . Smokeless tobacco: Never Used  Substance Use Topics  . Alcohol use: Yes    Alcohol/week: 7.0 standard drinks    Types: 7 Glasses of wine per week  . Drug use: No    Review of Systems  Constitutional: Negative for  fever. Respiratory: Negative for cough or shortness of breath.  Musculoskeletal: Negative for myalgias Skin: Positive for red, erythematous rash over the face Neurological: Negative for numbness or paresthesias. ____________________________________________   PHYSICAL EXAM:  VITAL SIGNS: ED Triage Vitals  Enc Vitals Group     BP 01/24/19 1209 (!) 150/85     Pulse Rate 01/24/19 1209 72     Resp 01/24/19 1209 18     Temp 01/24/19 1209 98.7 F (37.1 C)     Temp Source 01/24/19 1209 Oral     SpO2 01/24/19 1209 98 %     Weight 01/24/19 1206 156 lb (70.8 kg)     Height 01/24/19 1206 5' 1.5" (1.562 m)     Head Circumference --      Peak Flow --      Pain Score 01/24/19 1206 0     Pain Loc --      Pain Edu? --      Excl. in GC? --      Constitutional: Well appearing. Eyes: Conjunctivae are clear without discharge or drainage. Nose: No rhinorrhea noted. Mouth/Throat: Airway is patent.  Neck: No stridor. Unrestricted range of motion observed. Cardiovascular: Capillary refill is <3 seconds.  Respiratory: Respirations are even and unlabored.. Musculoskeletal: Unrestricted range of motion observed. Neurologic: Awake, alert, and oriented x 4.  Skin: Diffuse, erythematous, macular rash over the face.  ____________________________________________   LABS (all labs ordered are listed, but only abnormal results are displayed)  Labs Reviewed - No data to display ____________________________________________  EKG  Not indicated. ____________________________________________  RADIOLOGY  Not indicated. ____________________________________________   PROCEDURES  Procedures ____________________________________________   INITIAL IMPRESSION / ASSESSMENT AND PLAN / ED COURSE  Mary Vasquez is a 78 y.o. female who presents to the emergency department for treatment and evaluation of facial rash.  The rash is pruritic and erythematous.  She states that her face feels kind of tight  and swollen.  She was given an injection of dexamethasone while here.  She will also be given a prescription for triamcinolone cream.  She was encouraged to follow-up with her primary care provider if not improving over the next couple of days.  She was encouraged to return to the ER if she begins to have any shortness breath, change in voice, throat swelling, difficulty swallowing, or other symptoms of concern.  It is possible that this is related to wearing a surgical mask.  She is awaiting cloth masks and will switch to those once she gets them.  Jenin C Mauritz was evaluated in Emergency Department on 01/24/2019 for the symptoms described in the history of present illness. She was evaluated in the context of the global COVID-19 pandemic, which necessitated consideration that  the patient might be at risk for infection with the SARS-CoV-2 virus that causes COVID-19. Institutional protocols and algorithms that pertain to the evaluation of patients at risk for COVID-19 are in a state of rapid change based on information released by regulatory bodies including the CDC and federal and state organizations. These policies and algorithms were followed during the patient's care in the ED.    Medications  dexamethasone (DECADRON) injection 10 mg (10 mg Intramuscular Given 01/24/19 1427)     Pertinent labs & imaging results that were available during my care of the patient were reviewed by me and considered in my medical decision making (see chart for details).  ____________________________________________   FINAL CLINICAL IMPRESSION(S) / ED DIAGNOSES  Final diagnoses:  Allergic reaction, initial encounter    ED Discharge Orders         Ordered    triamcinolone ointment (KENALOG) 0.5 %  2 times daily     01/24/19 1417           Note:  This document was prepared using Dragon voice recognition software and may include unintentional dictation errors.   Chinita Pesterriplett, Lus Kriegel B, FNP 01/24/19 [id number]     Sharman CheekStafford, Phillip, MD 01/25/19 [phone]

## 2019-01-24 NOTE — Discharge Instructions (Signed)
Please return for any concerns.  Follow up with primary care if not improving over the next 2 days.

## 2019-01-24 NOTE — ED Notes (Signed)
Pt c/o allergic reaction to her face with redness, swelling, itching, and burning x1 week. Pt denies using any different products on her face.

## 2019-01-24 NOTE — ED Triage Notes (Signed)
Pt arrived via POV with reports of possible allergic reaction for the past week, pt states her face is red and blotchy reports some swelling around eyes.  Pt denies any new medications or new foods.   Pt denies any shortness of breath, swelling of throat, tongue swelling.  Pt states she has seen a dermatologist for the past for allergic reactions to her face. Pt states she was told before it could be from red wine, but states she has not had any wine. Pt states she does not leave her house except to go to the grocery store and is unsure what could have caused the redness in her face.  Pt takes zyrtec daily-last dose was last night.

## 2019-01-26 ENCOUNTER — Other Ambulatory Visit: Payer: Self-pay | Admitting: Internal Medicine

## 2019-01-26 NOTE — Telephone Encounter (Signed)
Refilled: 08/01/2018 Last OV: 09/05/2018 Next OV: not scheduled Last TSH: 12/06/2017

## 2019-01-29 ENCOUNTER — Other Ambulatory Visit: Payer: Self-pay | Admitting: Internal Medicine

## 2019-01-29 DIAGNOSIS — Z1231 Encounter for screening mammogram for malignant neoplasm of breast: Secondary | ICD-10-CM

## 2019-02-06 ENCOUNTER — Other Ambulatory Visit: Payer: Self-pay

## 2019-03-10 ENCOUNTER — Ambulatory Visit
Admission: RE | Admit: 2019-03-10 | Discharge: 2019-03-10 | Disposition: A | Discharge status: Home / Self Care | Payer: PPO | Source: Ambulatory Visit | Attending: Internal Medicine | Admitting: Internal Medicine

## 2019-03-10 DIAGNOSIS — Z1231 Encounter for screening mammogram for malignant neoplasm of breast: Secondary | ICD-10-CM | POA: Insufficient documentation

## 2019-04-08 ENCOUNTER — Telehealth: Payer: Self-pay

## 2019-04-08 DIAGNOSIS — R7301 Impaired fasting glucose: Secondary | ICD-10-CM

## 2019-04-08 DIAGNOSIS — I10 Essential (primary) hypertension: Secondary | ICD-10-CM

## 2019-04-08 NOTE — Telephone Encounter (Signed)
Copied from [id number]. Topic: General - Inquiry >> Apr 08, 2019  4:37 PM Rutherford Nail, NT wrote: Reason for CRM: Patient calling and states that she usually gets her annual in the summer. States that she has not had one for this year. States that she is not having any issues and that she is doing fine. States that she had surgery in February 2020 and her BP bottomed out. States that she is no longer on BP medications and that her blood pressure has not gotten back to a level that she needs medication. States that the only medication that she is on is for her thyroid. Please advise on whether Dr Derrel Nip suggests doing a CPE at this time.

## 2019-04-09 NOTE — Telephone Encounter (Signed)
LMTCB

## 2019-04-09 NOTE — Telephone Encounter (Signed)
Pt call back to return your call.

## 2019-04-15 ENCOUNTER — Other Ambulatory Visit: Payer: Self-pay

## 2019-04-15 ENCOUNTER — Ambulatory Visit (INDEPENDENT_AMBULATORY_CARE_PROVIDER_SITE_OTHER): Payer: PPO

## 2019-04-15 DIAGNOSIS — Z23 Encounter for immunization: Secondary | ICD-10-CM | POA: Diagnosis not present

## 2019-04-21 NOTE — Addendum Note (Signed)
Addended by: Adair Laundry on: 04/21/2019 10:31 AM   Modules accepted: Orders

## 2019-04-21 NOTE — Telephone Encounter (Signed)
Spoke with pt and scheduled her for both lab and physical appt. Pt is aware of both appt dates and times.

## 2019-04-29 ENCOUNTER — Other Ambulatory Visit (INDEPENDENT_AMBULATORY_CARE_PROVIDER_SITE_OTHER): Payer: PPO

## 2019-04-29 ENCOUNTER — Other Ambulatory Visit: Payer: Self-pay

## 2019-04-29 DIAGNOSIS — I10 Essential (primary) hypertension: Secondary | ICD-10-CM | POA: Diagnosis not present

## 2019-04-29 DIAGNOSIS — E034 Atrophy of thyroid (acquired): Secondary | ICD-10-CM

## 2019-04-29 DIAGNOSIS — R7301 Impaired fasting glucose: Secondary | ICD-10-CM | POA: Diagnosis not present

## 2019-04-29 LAB — COMPREHENSIVE METABOLIC PANEL
ALT: 14 U/L (ref 0–35)
AST: 27 U/L (ref 0–37)
Albumin: 4.2 g/dL (ref 3.5–5.2)
Alkaline Phosphatase: 60 U/L (ref 39–117)
BUN: 12 mg/dL (ref 6–23)
CO2: 26 mEq/L (ref 19–32)
Calcium: 9.8 mg/dL (ref 8.4–10.5)
Chloride: 104 mEq/L (ref 96–112)
Creatinine, Ser: 1.06 mg/dL (ref 0.40–1.20)
GFR: 50.2 mL/min — ABNORMAL LOW (ref >60.00)
Glucose, Bld: 91 mg/dL (ref 70–99)
Potassium: 4 mEq/L (ref 3.5–5.1)
Sodium: 137 mEq/L (ref 135–145)
Total Bilirubin: 0.6 mg/dL (ref 0.2–1.2)
Total Protein: 7.2 g/dL (ref 6.0–8.3)

## 2019-04-29 LAB — LIPID PANEL
Cholesterol: 248 mg/dL — ABNORMAL HIGH (ref 0–200)
HDL: 61 mg/dL (ref >39.00)
LDL Cholesterol: 168 mg/dL — ABNORMAL HIGH (ref 0–99)
NonHDL: 187.4
Total CHOL/HDL Ratio: 4
Triglycerides: 98 mg/dL (ref 0.0–149.0)
VLDL: 19.6 mg/dL (ref 0.0–40.0)

## 2019-04-29 LAB — HEMOGLOBIN A1C: Hgb A1c MFr Bld: 5.6 % (ref 4.6–6.5)

## 2019-04-29 LAB — TSH: TSH: 2.63 u[IU]/mL (ref 0.35–4.50)

## 2019-05-01 ENCOUNTER — Ambulatory Visit: Payer: PPO | Admitting: Internal Medicine

## 2019-05-04 ENCOUNTER — Other Ambulatory Visit: Payer: Self-pay | Admitting: Internal Medicine

## 2019-05-04 DIAGNOSIS — E785 Hyperlipidemia, unspecified: Secondary | ICD-10-CM

## 2019-05-04 MED ORDER — ATORVASTATIN CALCIUM 20 MG PO TABS: 20.0000 mg | ORAL_TABLET | Freq: Every day | ORAL | 3 refills | Status: DC

## 2019-05-04 NOTE — Progress Notes (Signed)
Atorvastatin restarted for hyperlipidemia.  Repeat fasting labs in 3 months

## 2019-06-09 ENCOUNTER — Ambulatory Visit (INDEPENDENT_AMBULATORY_CARE_PROVIDER_SITE_OTHER): Payer: PPO

## 2019-06-09 ENCOUNTER — Other Ambulatory Visit: Payer: Self-pay

## 2019-06-09 DIAGNOSIS — Z0000 Encounter for general adult medical examination without abnormal findings: Secondary | ICD-10-CM | POA: Diagnosis not present

## 2019-06-09 NOTE — Progress Notes (Signed)
Subjective:   Mary Vasquez is a 78 y.o. female who presents for Medicare Annual (Subsequent) preventive examination.  Review of Systems:  No ROS.  Medicare Wellness Virtual Visit.  Visual/audio telehealth visit, UTA vital signs.   See social history for additional risk factors.   Cardiac Risk Factors include: advanced age (>14men, >53 women);hypertension     Objective:     Vitals: There were no vitals taken for this visit.  There is no height or weight on file to calculate BMI.  Advanced Directives 06/09/2019 01/24/2019 08/13/2018 07/31/2018 11/28/2015  Does Patient Have a Medical Advance Directive? Yes No No No No  Type of Advance Directive [place]  Does patient want to make changes to medical advance directive? No - Patient declined - - - -  Copy of [place] in Chart? No - copy requested - - - -  Would patient like information on creating a medical advance directive? - No - Patient declined Yes (MAU/Ambulatory/Procedural Areas - Information given) Yes (MAU/Ambulatory/Procedural Areas - Information given) Yes - Educational materials given    Tobacco Social History   Tobacco Use  Smoking Status Never Smoker  Smokeless Tobacco Never Used     Counseling given: Not Answered   Clinical Intake:  Pre-visit preparation completed: Yes        Diabetes: No  How often do you need to have someone help you when you read instructions, pamphlets, or other written materials from your doctor or pharmacy?: 1 - Never  Interpreter Needed?: No     Past Medical History:  Diagnosis Date  . Arthritis   . Depression    situational  . History of kidney stones   . Hyperlipidemia   . Hypertension   . Hypothyroidism    Past Surgical History:  Procedure Laterality Date  . BREAST BIOPSY Left 2009   CORE - NEG...marker in breast  . BREAST SURGERY  2009   stereotactic,  left,  benign   . HYSTEROSCOPY  2008  . ROTATOR CUFF REPAIR Right  2009   [facility]  . 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   Procedure: TOTAL HIP ARTHROPLASTY ANTERIOR APPROACH;  Surgeon: Lovell Sheehan, MD;  Location: [facility] ORS;  Service: Orthopedics;  Laterality: Right;   Family History  Problem Relation Age of Onset  . Heart disease Mother 36       ami  . Heart disease Father        ami  . Diabetes Brother   . Cancer Brother        prostate ca  . Breast cancer Paternal Aunt        34'S  . Breast cancer Cousin    Social History   Socioeconomic History  . Marital status: Divorced    Spouse name: Not on file  . Number of children: Not on file  . Years of education: Not on file  . Highest education level: Not on file  Occupational History  . 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 Financial resource strain: Not hard at all  . Food insecurity    Worry: Never true    Inability: Never true  . Transportation needs    Medical: No    Non-medical: No  Tobacco Use  . Smoking status: Never Smoker  . Smokeless tobacco: Never Used  Substance and Sexual Activity  . Alcohol use: Yes    Alcohol/week: 7.0 standard drinks  Types: 7 Glasses of wine per week  . Drug use: No  . Sexual activity: Not Currently  Lifestyle  . Physical activity    Days per week: 3 days    Minutes per session: 20 min  . Stress: Not at all  Relationships  . Social Musician on phone: Not on file    Gets together: Not on file    Attends religious service: Not on file    Active member of club or organization: Not on file    Attends meetings of clubs or organizations: Not on file    Relationship status: Not on file  Other Topics Concern  . Not on file  Social History Narrative  . Not on file    Outpatient Encounter Medications as of 06/09/2019  Medication Sig  . acetaminophen (TYLENOL) 500 MG tablet Take 1,000 mg by mouth every 6 (six) hours as needed for moderate pain or headache.  Marland Kitchen amLODipine (NORVASC) 5 MG tablet TAKE 1  TABLET BY MOUTH EVERY DAY (Patient not taking: Reported on 09/05/2018)  . aspirin 81 MG chewable tablet Chew 1 tablet (81 mg total) by mouth 2 (two) times daily.  Marland Kitchen atorvastatin (LIPITOR) 20 MG tablet Take 1 tablet (20 mg total) by mouth daily.  . Cholecalciferol (VITAMIN D3) 25 MCG (1000 UT) CAPS Take 1,000 Units by mouth 2 (two) times daily.  Marland Kitchen docusate sodium (COLACE) 100 MG capsule Take 1 capsule (100 mg total) by mouth 2 (two) times daily. (Patient not taking: Reported on 09/05/2018)  . levothyroxine (SYNTHROID) 25 MCG tablet TAKE 1 TABLET BY MOUTH DAILY BEFORE BREAKFAST EXCEPT ON SUNDAY AND THURSDAY TAKE 2 TABS  . Red Yeast Rice 600 MG CAPS Take 1 capsule (600 mg total) by mouth 2 (two) times daily.  . traMADol (ULTRAM) 50 MG tablet Take 50 mg by mouth every 6 (six) hours as needed.  . triamcinolone ointment (KENALOG) 0.5 % Apply 1 application topically 2 (two) times daily.   No facility-administered encounter medications on file as of 06/09/2019.     Activities of Daily Living In your present state of health, do you have any difficulty performing the following activities: 06/09/2019 08/13/2018  Hearing? N N  Vision? N N  Difficulty concentrating or making decisions? N N  Walking or climbing stairs? N N  Comment - takes the stairs slowly  Dressing or bathing? N N  Doing errands, shopping? N N  Preparing Food and eating ? N -  Using the Toilet? N -  In the past six months, have you accidently leaked urine? N -  Do you have problems with loss of bowel control? N -  Managing your Medications? N -  Managing your Finances? N -  Housekeeping or managing your Housekeeping? N -  Some recent data might be hidden    Patient Care Team: Sherlene Shams, MD as PCP - General (Internal Medicine)    Assessment:   This is a routine wellness examination for Mary Vasquez.  Nurse connected with patient 06/09/19 at 10:30 AM EST by a telephone enabled telemedicine application and verified that I am  speaking with the correct person using two identifiers. Patient stated full name and DOB. Patient gave permission to continue with virtual visit. Patient's location was at home and Nurse's location was at [place] office.   Health Maintenance Due: See completed HM at the end of note.   Eye: Visual acuity not assessed. Virtual visit. Wears corrective lenses. Followed by their ophthalmologist.  Dental: UTD   Hearing: Demonstrates normal hearing during visit.  Safety:  Patient feels safe at home- yes Patient does have smoke detectors at home- yes Patient does wear sunscreen or protective clothing when in direct sunlight - yes Patient does wear seat belt when in a moving vehicle - yes Patient drives- yes Adequate lighting in walkways free from debris- yes Grab bars and handrails used as appropriate- yes Ambulates with no assistive device Cell phone on person when ambulating outside of the home- yes  Social: Alcohol intake - yes      Smoking history- never   Smokers in home? none Illicit drug use? none  Depression: PHQ 2 &9 complete. See screening below. Recent loss of daughter in October. Patient states she is grieving but is okay. Declines additional follow up at this time.  Falls: See screening below.    Medication: Taking as directed and without issues.   Covid-19: Precautions and sickness symptoms discussed. Wears mask, social distancing, hand hygiene as appropriate.   Activities of Daily Living Patient denies needing assistance with: household chores, feeding themselves, getting from bed to chair, getting to the toilet, bathing/showering, dressing, managing money, or preparing meals.   Memory: Patient is alert. Patient denies difficulty focusing or concentrating. Correctly identified the president of the [place], season and recall. Patient likes to read and play sodoku for brain stimulation.   BMI- discussed the importance of a healthy diet, water intake and the  benefits of aerobic exercise.  Educational material provided.  Physical activity- walking 3 days, 20-30 minutes  Diet:  Regular Water: good intake  Other Providers Patient Care Team: Sherlene Shamsullo, Teresa L, MD as PCP - General (Internal Medicine)  Exercise Activities and Dietary recommendations Current Exercise Habits: Home exercise routine, Type of exercise: walking, Time (Minutes): 20, Frequency (Times/Week): 3, Weekly Exercise (Minutes/Week): 60, Intensity: Mild  Goals      Patient Stated   . Weight (lb) < 156 lb (70.8 kg) (pt-stated)     Low cholesterol foods Stay active Lose about 10lbs        Fall Risk Fall Risk  06/09/2019 02/06/2019 12/10/2017 12/06/2016 11/28/2015  Falls in the past year? 0 0 No No No  Comment - Emmi Telephone Survey: data to providers prior to load - - -  Follow up Education provided;Falls prevention discussed - - - -   Timed Get Up and Go performed: no, virtual visit  Depression Screen PHQ 2/9 Scores 06/09/2019 12/10/2017 12/06/2016 11/28/2015  PHQ - 2 Score 1 0 3 0  PHQ- 9 Score - 2 8 -     Cognitive Function MMSE - Mini Mental State Exam 11/28/2015  Orientation to time 5  Orientation to Place 5  Registration 3  Attention/ Calculation 5  Recall 3  Language- name 2 objects 2  Language- repeat 1  Language- follow 3 step command 3  Language- read & follow direction 1  Write a sentence 1  Copy design 1  Total score 30     6CIT Screen 06/09/2019  What Year? 0 points  What month? 0 points  What time? 0 points  Count back from 20 0 points  Months in reverse 0 points  Repeat phrase 0 points  Total Score 0    Immunization History  Administered Date(s) Administered  . Fluad Quad(high Dose 65+) 04/15/2019  . Influenza Nasal 04/11/2015  . Influenza, High Dose Seasonal PF 05/28/2016, 06/02/2018  . Influenza-Unspecified 04/20/2013, 04/16/2014  . Pneumococcal Conjugate-13 04/23/2013  . Pneumococcal Polysaccharide-23 04/23/2006,  06/14/2014  . Tdap  07/09/2009   Screening Tests Health Maintenance  Topic Date Due  . TETANUS/TDAP  07/10/2019  . MAMMOGRAM  03/09/2020  . INFLUENZA VACCINE  Completed  . DEXA SCAN  Completed  . PNA vac Low Risk Adult  Completed      Plan:   Keep all routine maintenance appointments.   Next scheduled lab 08/13/18  Cpe 07/07/19 @ 1:30  Medicare Attestation I have personally reviewed: The patient's medical and social history Their use of alcohol, tobacco or illicit drugs Their current medications and supplements The patient's functional ability including ADLs,fall risks, home safety risks, cognitive, and hearing and visual impairment Diet and physical activities Evidence for depression   In addition, I have reviewed and discussed with patient certain preventive protocols, quality metrics, and best practice recommendations. A written personalized care plan for preventive services as well as general preventive health recommendations were provided to patient via mail.     Ashok Pall, LPN  [id number]

## 2019-06-09 NOTE — Patient Instructions (Addendum)
  Mary Vasquez , Thank you for taking time to come for your Medicare Wellness Visit. I appreciate your ongoing commitment to your health goals. Please review the following plan we discussed and let me know if I can assist you in the future.   These are the goals we discussed: Goals      Patient Stated   . Weight (lb) < 156 lb (70.8 kg) (pt-stated)     Low cholesterol foods Stay active Lose about 10lbs        This is a list of the screening recommended for you and due dates:  Health Maintenance  Topic Date Due  . Tetanus Vaccine  07/10/2019  . Mammogram  03/09/2020  . Flu Shot  Completed  . DEXA scan (bone density measurement)  Completed  . Pneumonia vaccines  Completed

## 2019-07-07 ENCOUNTER — Encounter: Payer: PPO | Admitting: Internal Medicine

## 2019-07-29 ENCOUNTER — Other Ambulatory Visit: Payer: Self-pay | Admitting: Internal Medicine

## 2019-08-14 ENCOUNTER — Other Ambulatory Visit (INDEPENDENT_AMBULATORY_CARE_PROVIDER_SITE_OTHER): Payer: PPO

## 2019-08-14 ENCOUNTER — Other Ambulatory Visit: Payer: Self-pay

## 2019-08-14 DIAGNOSIS — E785 Hyperlipidemia, unspecified: Secondary | ICD-10-CM | POA: Diagnosis not present

## 2019-08-14 LAB — LIPID PANEL
Cholesterol: 181 mg/dL (ref 0–200)
HDL: 68.9 mg/dL (ref >39.00)
LDL Cholesterol: 96 mg/dL (ref 0–99)
NonHDL: 112.29
Total CHOL/HDL Ratio: 3
Triglycerides: 80 mg/dL (ref 0.0–149.0)
VLDL: 16 mg/dL (ref 0.0–40.0)

## 2019-08-14 LAB — COMPREHENSIVE METABOLIC PANEL
ALT: 19 U/L (ref 0–35)
AST: 29 U/L (ref 0–37)
Albumin: 4.2 g/dL (ref 3.5–5.2)
Alkaline Phosphatase: 59 U/L (ref 39–117)
BUN: 20 mg/dL (ref 6–23)
CO2: 31 mEq/L (ref 19–32)
Calcium: 9.4 mg/dL (ref 8.4–10.5)
Chloride: 103 mEq/L (ref 96–112)
Creatinine, Ser: 1.05 mg/dL (ref 0.40–1.20)
GFR: 50.71 mL/min — ABNORMAL LOW (ref >60.00)
Glucose, Bld: 96 mg/dL (ref 70–99)
Potassium: 3.8 mEq/L (ref 3.5–5.1)
Sodium: 140 mEq/L (ref 135–145)
Total Bilirubin: 0.8 mg/dL (ref 0.2–1.2)
Total Protein: 7.2 g/dL (ref 6.0–8.3)

## 2019-08-14 NOTE — Addendum Note (Signed)
Addended by: Warden Fillers on: 08/14/2019 08:52 AM   Modules accepted: Orders

## 2019-08-27 ENCOUNTER — Encounter: Payer: PPO | Admitting: Internal Medicine

## 2019-09-11 ENCOUNTER — Other Ambulatory Visit: Payer: Self-pay

## 2019-09-15 ENCOUNTER — Encounter: Payer: Self-pay | Admitting: Internal Medicine

## 2019-09-15 ENCOUNTER — Ambulatory Visit (INDEPENDENT_AMBULATORY_CARE_PROVIDER_SITE_OTHER): Payer: PPO | Admitting: Internal Medicine

## 2019-09-15 ENCOUNTER — Other Ambulatory Visit: Payer: Self-pay

## 2019-09-15 VITALS — BP 140/80 | HR 74 | Temp 97.8°F | Resp 15 | Ht 61.5 in | Wt 163.6 lb

## 2019-09-15 DIAGNOSIS — F4321 Adjustment disorder with depressed mood: Secondary | ICD-10-CM | POA: Diagnosis not present

## 2019-09-15 DIAGNOSIS — E034 Atrophy of thyroid (acquired): Secondary | ICD-10-CM | POA: Diagnosis not present

## 2019-09-15 DIAGNOSIS — N1831 Chronic kidney disease, stage 3a: Secondary | ICD-10-CM

## 2019-09-15 DIAGNOSIS — Z1211 Encounter for screening for malignant neoplasm of colon: Secondary | ICD-10-CM | POA: Diagnosis not present

## 2019-09-15 DIAGNOSIS — E785 Hyperlipidemia, unspecified: Secondary | ICD-10-CM

## 2019-09-15 DIAGNOSIS — Z0000 Encounter for general adult medical examination without abnormal findings: Secondary | ICD-10-CM | POA: Diagnosis not present

## 2019-09-15 DIAGNOSIS — Z634 Disappearance and death of family member: Secondary | ICD-10-CM | POA: Diagnosis not present

## 2019-09-15 DIAGNOSIS — F339 Major depressive disorder, recurrent, unspecified: Secondary | ICD-10-CM | POA: Insufficient documentation

## 2019-09-15 NOTE — Assessment & Plan Note (Addendum)

## 2019-09-15 NOTE — Assessment & Plan Note (Addendum)
Patient is dealing with the unexpected loss of her daughter and has adequate coping skills and emotional support .  i have asked patient to return in one month to examine for signs of unresolving grief.

## 2019-09-15 NOTE — Assessment & Plan Note (Signed)
Cr is stable after  suspension of NSAIDs.   Lab Results  Component Value Date   CREATININE 1.05 08/14/2019

## 2019-09-15 NOTE — Assessment & Plan Note (Signed)
Improved with atorvastatin .  LFTs are normal.  Lab Results  Component Value Date   CHOL 181 08/14/2019   HDL 68.90 08/14/2019   LDLCALC 96 08/14/2019   LDLDIRECT 91.0 09/09/2015   TRIG 80.0 08/14/2019   CHOLHDL 3 08/14/2019

## 2019-09-15 NOTE — Progress Notes (Signed)
Patient ID: Mary Vasquez, female    DOB: 1940/12/03  Age: 79 y.o. MRN: [id number]  The patient is here for annual wellness examination and management of other chronic and acute problems.   The risk factors are reflected in the social history.  The roster of all physicians providing medical care to patient - is listed in the Snapshot section of the chart.  Activities of daily living:  The patient is 100% independent in all ADLs: dressing, toileting, feeding as well as independent mobility  Home safety : The patient has smoke detectors in the home. They wear seatbelts.  There are no firearms at home. There is no violence in the home.   There is no risks for hepatitis, STDs or HIV. There is no   history of blood transfusion. They have no travel history to infectious disease endemic areas of the world.  The patient has seen their dentist in the last six month. They have seen their eye doctor in the last year. They admit to slight hearing difficulty with regard to whispered voices and some television programs.  They have deferred audiologic testing in the last year.  They do not  have excessive sun exposure. Discussed the need for sun protection: hats, long sleeves and use of sunscreen if there is significant sun exposure.   Diet: the importance of a healthy diet is discussed. They do have a healthy diet.  The benefits of regular aerobic exercise were discussed. She walks 4 times per week ,  20 minutes.   Depression screen: there are no signs or vegative symptoms of depression- irritability, change in appetite, anhedonia, sadness/tearfullness.  Cognitive assessment: the patient manages all their financial and personal affairs and is actively engaged. They could relate day,date,year and events; recalled 2/3 objects at 3 minutes; performed clock-face test normally.  The following portions of the patient's history were reviewed and updated as appropriate: allergies, current medications, past family  history, past medical history,  past surgical history, past social history  and problem list.  Visual acuity was not assessed per patient preference since she has regular follow up with her ophthalmologist. Hearing and body mass index were assessed and reviewed.   During the course of the visit the patient was educated and counseled about appropriate screening and preventive services including : fall prevention , diabetes screening, nutrition counseling, colorectal cancer screening, and recommended immunizations.    CC: The primary encounter diagnosis was Screening for colon cancer. Diagnoses of Colon cancer screening, Hyperlipidemia with target LDL less than 100, Grief at loss of child, Stage 3a chronic kidney disease, and Hypothyroidism due to acquired atrophy of thyroid were also pertinent to this visit.  1) grief:  Lost her duaghter  In October to a cerebral aneurysm   2) hip doing great  Has joined [facility] [facility] gym  3) fasting labs in Feb on atorvastatin much improved  4) renal function dicussed  Not using nsaids.  Only tylenol prn  History Randi has a past medical history of Arthritis, Depression, History of kidney stones, Hyperlipidemia, Hypertension, and Hypothyroidism.   She has a past surgical history that includes Hysteroscopy (2008); Rotator cuff repair (Right, 2009); Breast surgery (2009); Breast biopsy (Left, 2009); and Total hip arthroplasty (Right, 08/13/2018).   Her family history includes Breast cancer in her cousin and paternal aunt; Cancer in her brother; Diabetes in her brother; Heart disease in her father; Heart disease (age of onset: 78) in her mother.She reports that she has never smoked. She has never  used smokeless tobacco. She reports current alcohol use of about 7.0 standard drinks of alcohol per week. She reports that she does not use drugs.  Outpatient Medications Prior to Visit  Medication Sig Dispense Refill  . acetaminophen (TYLENOL) 500 MG tablet Take 1,000 mg by  mouth every 6 (six) hours as needed for moderate pain or headache.    Marland Kitchen atorvastatin (LIPITOR) 20 MG tablet Take 1 tablet (20 mg total) by mouth daily. 90 tablet 3  . Cholecalciferol (VITAMIN D3) 25 MCG (1000 UT) CAPS Take 1,000 Units by mouth 2 (two) times daily.    Marland Kitchen levothyroxine (SYNTHROID) 25 MCG tablet TAKE 1 TABLET BY MOUTH DAILY BEFORE BREAKFAST EXCEPT ON SUNDAY AND THURSDAY TAKE 2 TABS 114 tablet 1  . amLODipine (NORVASC) 5 MG tablet TAKE 1 TABLET BY MOUTH EVERY DAY (Patient not taking: Reported on 09/05/2018) 90 tablet 1  . aspirin 81 MG chewable tablet Chew 1 tablet (81 mg total) by mouth 2 (two) times daily. (Patient not taking: Reported on 09/15/2019) 60 tablet 0  . docusate sodium (COLACE) 100 MG capsule Take 1 capsule (100 mg total) by mouth 2 (two) times daily. (Patient not taking: Reported on 09/05/2018) 10 capsule 0  . Red Yeast Rice 600 MG CAPS Take 1 capsule (600 mg total) by mouth 2 (two) times daily. (Patient not taking: Reported on 09/15/2019) 180 capsule 1  . traMADol (ULTRAM) 50 MG tablet Take 50 mg by mouth every 6 (six) hours as needed.    . triamcinolone ointment (KENALOG) 0.5 % Apply 1 application topically 2 (two) times daily. (Patient not taking: Reported on 09/15/2019) 30 g 0   No facility-administered medications prior to visit.    Review of Systems   Patient denies headache, fevers, malaise, unintentional weight loss, skin rash, eye pain, sinus congestion and sinus pain, sore throat, dysphagia,  hemoptysis , cough, dyspnea, wheezing, chest pain, palpitations, orthopnea, edema, abdominal pain, nausea, melena, diarrhea, constipation, flank pain, dysuria, hematuria, urinary  Frequency, nocturia, numbness, tingling, seizures,  Focal weakness, Loss of consciousness,  Tremor, insomnia, depression, anxiety, and suicidal ideation.   \  Objective:  BP 140/80 (BP Location: Left Arm, Patient Position: Sitting, Cuff Size: Normal)   Pulse 74   Temp 97.8 F (36.6 C) (Temporal)    Resp 15   Ht 5' 1.5" (1.562 m)   Wt 163 lb 9.6 oz (74.2 kg)   SpO2 97%   BMI 30.41 kg/m   Physical Exam  General appearance: alert, cooperative and appears stated age Head: Normocephalic, without obvious abnormality, atraumatic Eyes: conjunctivae/corneas clear. PERRL, EOM's intact. Fundi benign. Ears: normal TM's and external ear canals both ears Nose: Nares normal. Septum midline. Mucosa normal. No drainage or sinus tenderness. Throat: lips, mucosa, and tongue normal; teeth and gums normal Neck: no adenopathy, no carotid bruit, no JVD, supple, symmetrical, trachea midline and thyroid not enlarged, symmetric, no tenderness/mass/nodules Lungs: clear to auscultation bilaterally Breasts: normal appearance, no masses or tenderness Heart: regular rate and rhythm, S1, S2 normal, no murmur, click, rub or gallop Abdomen: soft, non-tender; bowel sounds normal; no masses,  no organomegaly Extremities: extremities normal, atraumatic, no cyanosis or edema Pulses: 2+ and symmetric Skin: Skin color, texture, turgor normal. No rashes or lesions Neurologic: Alert and oriented X 3, normal strength and tone. Normal symmetric reflexes. Normal coordination and gait.    Assessment & Plan:   Problem List Items Addressed This Visit      Unprioritized   Hyperlipidemia with target LDL less than  100    Improved with atorvastatin .  LFTs are normal.  Lab Results  Component Value Date   CHOL 181 08/14/2019   HDL 68.90 08/14/2019   LDLCALC 96 08/14/2019   LDLDIRECT 91.0 09/09/2015   TRIG 80.0 08/14/2019   CHOLHDL 3 08/14/2019         Encounter for preventive health examination    age appropriate education and counseling updated, referrals for preventative services and immunizations addressed, dietary and smoking counseling addressed, most recent labs reviewed.  I have personally reviewed and have noted:  1) the patient's medical and social history 2) The pt's use of alcohol, tobacco, and illicit  drugs 3) The patient's current medications and supplements 4) Functional ability including ADL's, fall risk, home safety risk, hearing and visual impairment 5) Diet and physical activities 6) Evidence for depression or mood disorder 7) The patient's height, weight, and BMI have been recorded in the chart  I have made referrals, and provided counseling and education based on review of the above      Hypothyroidism    Thyroid function  Is normal  Lab Results  Component Value Date   TSH 2.63 04/29/2019         CKD (chronic kidney disease) stage 3, GFR 30-59 ml/min    Cr is stable after  suspension of NSAIDs.   Lab Results  Component Value Date   CREATININE 1.05 08/14/2019         Grief at loss of child    Patient is dealing with the unexpected loss of her daughter and has adequate coping skills and emotional support .  i have asked patient to return in one month to examine for signs of unresolving grief.        Other Visit Diagnoses    Screening for colon cancer    -  Primary   Relevant Orders   Cologuard      I have discontinued Chieko C. Calligan's Red Yeast Rice, aspirin, docusate sodium, traMADol, and triamcinolone ointment. I am also having her maintain her Vitamin D3, acetaminophen, amLODipine, atorvastatin, and levothyroxine.  No orders of the defined types were placed in this encounter.   Medications Discontinued During This Encounter  Medication Reason  . aspirin 81 MG chewable tablet Patient has not taken in last 30 days  . docusate sodium (COLACE) 100 MG capsule Patient has not taken in last 30 days  . Red Yeast Rice 600 MG CAPS Patient has not taken in last 30 days  . traMADol (ULTRAM) 50 MG tablet Patient has not taken in last 30 days  . triamcinolone ointment (KENALOG) 0.5 % Patient has not taken in last 30 days    Follow-up: No follow-ups on file.   Sherlene Shams, MD

## 2019-09-15 NOTE — Patient Instructions (Signed)
I will order your DEXA scan this month to follow up on your bone density  I will see you again in 6 months (fasting labs a day or two prior )  Health Maintenance for Postmenopausal Women Menopause is a normal process in which your ability to get pregnant comes to an end. This process happens slowly over many months or years, usually between the ages of 37 and 21. Menopause is complete when you have missed your menstrual periods for 12 months. It is important to talk with your health care provider about some of the most common conditions that affect women after menopause (postmenopausal women). These include heart disease, cancer, and bone loss (osteoporosis). Adopting a healthy lifestyle and getting preventive care can help to promote your health and wellness. The actions you take can also lower your chances of developing some of these common conditions. What should I know about menopause? During menopause, you may get a number of symptoms, such as:  Hot flashes. These can be moderate or severe.  Night sweats.  Decrease in sex drive.  Mood swings.  Headaches.  Tiredness.  Irritability.  Memory problems.  Insomnia. Choosing to treat or not to treat these symptoms is a decision that you make with your health care provider. Do I need hormone replacement therapy?  Hormone replacement therapy is effective in treating symptoms that are caused by menopause, such as hot flashes and night sweats.  Hormone replacement carries certain risks, especially as you become older. If you are thinking about using estrogen or estrogen with progestin, discuss the benefits and risks with your health care provider. What is my risk for heart disease and stroke? The risk of heart disease, heart attack, and stroke increases as you age. One of the causes may be a change in the body's hormones during menopause. This can affect how your body uses dietary fats, triglycerides, and cholesterol. Heart attack and  stroke are medical emergencies. There are many things that you can do to help prevent heart disease and stroke. Watch your blood pressure  High blood pressure causes heart disease and increases the risk of stroke. This is more likely to develop in people who have high blood pressure readings, are of African descent, or are overweight.  Have your blood pressure checked: ? Every 3-5 years if you are 25-26 years of age. ? Every year if you are 54 years old or older. Eat a healthy diet   Eat a diet that includes plenty of vegetables, fruits, low-fat dairy products, and lean protein.  Do not eat a lot of foods that are high in solid fats, added sugars, or sodium. Get regular exercise Get regular exercise. This is one of the most important things you can do for your health. Most adults should:  Try to exercise for at least 150 minutes each week. The exercise should increase your heart rate and make you sweat (moderate-intensity exercise).  Try to do strengthening exercises at least twice each week. Do these in addition to the moderate-intensity exercise.  Spend less time sitting. Even light physical activity can be beneficial. Other tips  Work with your health care provider to achieve or maintain a healthy weight.  Do not use any products that contain nicotine or tobacco, such as cigarettes, e-cigarettes, and chewing tobacco. If you need help quitting, ask your health care provider.  Know your numbers. Ask your health care provider to check your cholesterol and your blood sugar (glucose). Continue to have your blood tested as directed  by your health care provider. Do I need screening for cancer? Depending on your health history and family history, you may need to have cancer screening at different stages of your life. This may include screening for:  Breast cancer.  Cervical cancer.  Lung cancer.  Colorectal cancer. What is my risk for osteoporosis? After menopause, you may be at  increased risk for osteoporosis. Osteoporosis is a condition in which bone destruction happens more quickly than new bone creation. To help prevent osteoporosis or the bone fractures that can happen because of osteoporosis, you may take the following actions:  If you are 30-110 years old, get at least 1,000 mg of calcium and at least 600 mg of vitamin D per day.  If you are older than age 83 but younger than age 28, get at least 1,200 mg of calcium and at least 600 mg of vitamin D per day.  If you are older than age 35, get at least 1,200 mg of calcium and at least 800 mg of vitamin D per day. Smoking and drinking excessive alcohol increase the risk of osteoporosis. Eat foods that are rich in calcium and vitamin D, and do weight-bearing exercises several times each week as directed by your health care provider. How does menopause affect my mental health? Depression may occur at any age, but it is more common as you become older. Common symptoms of depression include:  Low or sad mood.  Changes in sleep patterns.  Changes in appetite or eating patterns.  Feeling an overall lack of motivation or enjoyment of activities that you previously enjoyed.  Frequent crying spells. Talk with your health care provider if you think that you are experiencing depression. General instructions See your health care provider for regular wellness exams and vaccines. This may include:  Scheduling regular health, dental, and eye exams.  Getting and maintaining your vaccines. These include: ? Influenza vaccine. Get this vaccine each year before the flu season begins. ? Pneumonia vaccine. ? Shingles vaccine. ? Tetanus, diphtheria, and pertussis (Tdap) booster vaccine. Your health care provider may also recommend other immunizations. Tell your health care provider if you have ever been abused or do not feel safe at home. Summary  Menopause is a normal process in which your ability to get pregnant comes to an  end.  This condition causes hot flashes, night sweats, decreased interest in sex, mood swings, headaches, or lack of sleep.  Treatment for this condition may include hormone replacement therapy.  Take actions to keep yourself healthy, including exercising regularly, eating a healthy diet, watching your weight, and checking your blood pressure and blood sugar levels.  Get screened for cancer and depression. Make sure that you are up to date with all your vaccines. This information is not intended to replace advice given to you by your health care provider. Make sure you discuss any questions you have with your health care provider. Document Revised: 06/18/2018 Document Reviewed: 06/18/2018 Elsevier Patient Education  2020 [employer].

## 2019-09-15 NOTE — Assessment & Plan Note (Signed)
Thyroid function  Is normal  Lab Results  Component Value Date   TSH 2.63 04/29/2019

## 2019-10-19 ENCOUNTER — Telehealth: Payer: Self-pay | Admitting: Internal Medicine

## 2019-10-19 NOTE — Telephone Encounter (Signed)
Spoke with pt and she stated that she has not had any trauma or injury to her breast, has had no straining activity, no acute symptoms. Advised pt to give [place] a call back on Thursday this week to let us know how she is doing.

## 2019-10-19 NOTE — Telephone Encounter (Signed)
Noted  

## 2019-10-19 NOTE — Telephone Encounter (Signed)
Need to confirm if any trauma or injury.  Has she been doing any activity that could have contributed to muscle pain, etc.  Has she taken anything for the pain?  Tylenol?  If no acute issues and no other symptoms, ok to monitor for a few days.  Needs to call with update over the next few days.  Forward to Dr Darrick Huntsman as well to see if she has any other recommendations.

## 2019-10-19 NOTE — Telephone Encounter (Signed)
Appears it only bothers her when she rolls over on the breast.  No other problems.  Let me know if she needs anything and agree with update call in a few days to reassess.

## 2019-10-19 NOTE — Telephone Encounter (Signed)
Spoke with Mary Vasquez to schedule her an appt with Selena Batten, NP for tomorrow. Mary Vasquez stated that she was exposed to positive covid on Easter Sunday so Mary Vasquez can not come in the office for 30 days. The Mary Vasquez didn't think a virtual visit would do any good and did not want to go to [facility]. Mary Vasquez scheduled an appt for May 5th and stated that she would keep an eye on the right breast pain. Do you think Mary Vasquez should wait that long or do you think she should go ahead and do the virtual visit?

## 2019-10-19 NOTE — Telephone Encounter (Signed)
Patient called and is having pain in rt breast, feels no lump, saw [facility] a week ago, no lump found. Last mammogram last September. When she turns onto rt side in bed and it hurts. No pain when sitting or standing. Should she give it time or what should she do?

## 2019-10-20 DIAGNOSIS — Z1211 Encounter for screening for malignant neoplasm of colon: Secondary | ICD-10-CM | POA: Diagnosis not present

## 2019-10-21 LAB — COLOGUARD: Cologuard: NEGATIVE

## 2019-10-27 ENCOUNTER — Telehealth: Payer: Self-pay | Admitting: Internal Medicine

## 2019-10-27 DIAGNOSIS — Z780 Asymptomatic menopausal state: Secondary | ICD-10-CM

## 2019-10-27 NOTE — Telephone Encounter (Signed)
Spoke with pt and informed her of her lab results. Pt also stated that the pain in her breast that she was having has completely gone and would like to cancel her appt and rescheduled for a follow up in august. Appt has been moved.

## 2019-10-27 NOTE — Telephone Encounter (Signed)
The results of patient's cologuard is negative.   We will repeat every 3 years For colon CA screening. Please abstract and notify patient  

## 2019-11-03 DIAGNOSIS — H2513 Age-related nuclear cataract, bilateral: Secondary | ICD-10-CM | POA: Diagnosis not present

## 2019-11-03 DIAGNOSIS — H43813 Vitreous degeneration, bilateral: Secondary | ICD-10-CM | POA: Diagnosis not present

## 2019-11-11 ENCOUNTER — Ambulatory Visit: Payer: PPO | Admitting: Internal Medicine

## 2019-11-18 ENCOUNTER — Encounter: Payer: Self-pay | Admitting: Ophthalmology

## 2019-11-18 ENCOUNTER — Other Ambulatory Visit: Payer: Self-pay

## 2019-11-18 DIAGNOSIS — H2512 Age-related nuclear cataract, left eye: Secondary | ICD-10-CM | POA: Diagnosis not present

## 2019-11-18 DIAGNOSIS — E7800 Pure hypercholesterolemia, unspecified: Secondary | ICD-10-CM | POA: Diagnosis not present

## 2019-11-23 ENCOUNTER — Other Ambulatory Visit: Payer: Self-pay

## 2019-11-23 ENCOUNTER — Other Ambulatory Visit
Admission: RE | Admit: 2019-11-23 | Discharge: 2019-11-23 | Disposition: A | Discharge status: Home / Self Care | Payer: PPO | Source: Ambulatory Visit | Attending: Ophthalmology | Admitting: Ophthalmology

## 2019-11-23 DIAGNOSIS — Z20822 Contact with and (suspected) exposure to covid-19: Secondary | ICD-10-CM | POA: Diagnosis not present

## 2019-11-23 DIAGNOSIS — Z01812 Encounter for preprocedural laboratory examination: Secondary | ICD-10-CM | POA: Insufficient documentation

## 2019-11-23 LAB — SARS CORONAVIRUS 2 (TAT 6-24 HRS): SARS Coronavirus 2: NEGATIVE

## 2019-11-23 NOTE — Discharge Instructions (Signed)

## 2019-11-25 ENCOUNTER — Encounter: Payer: Self-pay | Admitting: Ophthalmology

## 2019-11-25 ENCOUNTER — Ambulatory Visit: Payer: PPO | Admitting: Anesthesiology

## 2019-11-25 ENCOUNTER — Ambulatory Visit
Admission: RE | Admit: 2019-11-25 | Discharge: 2019-11-25 | Disposition: A | Discharge status: Home / Self Care | Payer: PPO | Attending: Ophthalmology | Admitting: Ophthalmology

## 2019-11-25 ENCOUNTER — Other Ambulatory Visit: Payer: Self-pay

## 2019-11-25 ENCOUNTER — Encounter
Admission: RE | Disposition: A | Discharge status: Home / Self Care | Payer: Self-pay | Source: Home / Self Care | Attending: Ophthalmology

## 2019-11-25 DIAGNOSIS — E7800 Pure hypercholesterolemia, unspecified: Secondary | ICD-10-CM | POA: Insufficient documentation

## 2019-11-25 DIAGNOSIS — H2512 Age-related nuclear cataract, left eye: Secondary | ICD-10-CM | POA: Insufficient documentation

## 2019-11-25 DIAGNOSIS — E039 Hypothyroidism, unspecified: Secondary | ICD-10-CM | POA: Insufficient documentation

## 2019-11-25 DIAGNOSIS — H25812 Combined forms of age-related cataract, left eye: Secondary | ICD-10-CM | POA: Diagnosis not present

## 2019-11-25 DIAGNOSIS — F329 Major depressive disorder, single episode, unspecified: Secondary | ICD-10-CM | POA: Diagnosis not present

## 2019-11-25 DIAGNOSIS — Z79890 Hormone replacement therapy (postmenopausal): Secondary | ICD-10-CM | POA: Diagnosis not present

## 2019-11-25 DIAGNOSIS — Z79899 Other long term (current) drug therapy: Secondary | ICD-10-CM | POA: Insufficient documentation

## 2019-11-25 DIAGNOSIS — I10 Essential (primary) hypertension: Secondary | ICD-10-CM | POA: Insufficient documentation

## 2019-11-25 HISTORY — PX: CATARACT EXTRACTION W/PHACO: SHX586

## 2019-11-25 HISTORY — DX: Family history of other specified conditions: Z84.89

## 2019-11-25 HISTORY — DX: Dizziness and giddiness: R42

## 2019-11-25 SURGERY — PHACOEMULSIFICATION, CATARACT, WITH IOL INSERTION
Anesthesia: Monitor Anesthesia Care | Site: Eye | Laterality: Left

## 2019-11-25 MED ORDER — MIDAZOLAM HCL 2 MG/2ML IJ SOLN
INTRAMUSCULAR | Status: DC | PRN
  Administered 2019-11-25: 1 mg via INTRAVENOUS

## 2019-11-25 MED ORDER — BRIMONIDINE TARTRATE-TIMOLOL 0.2-0.5 % OP SOLN
OPHTHALMIC | Status: DC | PRN
  Administered 2019-11-25: 1 [drp] via OPHTHALMIC

## 2019-11-25 MED ORDER — TETRACAINE HCL 0.5 % OP SOLN
1.0000 [drp] | OPHTHALMIC | Status: DC | PRN
  Administered 2019-11-25 (×3): 1 [drp] via OPHTHALMIC

## 2019-11-25 MED ORDER — EPINEPHRINE PF 1 MG/ML IJ SOLN
INTRAOCULAR | Status: DC | PRN
  Administered 2019-11-25: 62 mL via OPHTHALMIC

## 2019-11-25 MED ORDER — MOXIFLOXACIN HCL 0.5 % OP SOLN
1.0000 [drp] | OPHTHALMIC | Status: DC | PRN
  Administered 2019-11-25 (×3): 1 [drp] via OPHTHALMIC

## 2019-11-25 MED ORDER — ARMC OPHTHALMIC DILATING DROPS
1.0000 "application " | OPHTHALMIC | Status: DC | PRN
  Administered 2019-11-25 (×3): 1 via OPHTHALMIC

## 2019-11-25 MED ORDER — NA HYALUR & NA CHOND-NA HYALUR 0.4-0.35 ML IO KIT
PACK | INTRAOCULAR | Status: DC | PRN
  Administered 2019-11-25: 1 mL via INTRAOCULAR

## 2019-11-25 MED ORDER — FENTANYL CITRATE (PF) 100 MCG/2ML IJ SOLN
INTRAMUSCULAR | Status: DC | PRN
  Administered 2019-11-25: 50 ug via INTRAVENOUS

## 2019-11-25 MED ORDER — LIDOCAINE HCL (PF) 2 % IJ SOLN
INTRAOCULAR | Status: DC | PRN
  Administered 2019-11-25: 2 mL

## 2019-11-25 MED ORDER — MOXIFLOXACIN HCL 0.5 % OP SOLN
OPHTHALMIC | Status: DC | PRN
  Administered 2019-11-25: 0.2 mL via OPHTHALMIC

## 2019-11-25 SURGICAL SUPPLY — 23 items
CANNULA ANT/CHMB 27G (MISCELLANEOUS) ×1 IMPLANT
CANNULA ANT/CHMB 27GA (MISCELLANEOUS) ×2 IMPLANT
GLOVE SURG LX 7.5 STRW (GLOVE) ×1
GLOVE SURG LX STRL 7.5 STRW (GLOVE) ×1 IMPLANT
GLOVE SURG TRIUMPH 8.0 PF LTX (GLOVE) ×2 IMPLANT
GOWN STRL REUS W/ TWL LRG LVL3 (GOWN DISPOSABLE) ×2 IMPLANT
GOWN STRL REUS W/TWL LRG LVL3 (GOWN DISPOSABLE) ×2
LENS IOL DIOP 23.5 (Intraocular Lens) ×2 IMPLANT
LENS IOL TECNIS MONO 23.5 (Intraocular Lens) IMPLANT
MARKER SKIN DUAL TIP RULER LAB (MISCELLANEOUS) ×2 IMPLANT
NDL CAPSULORHEX 25GA (NEEDLE) ×1 IMPLANT
NDL FILTER BLUNT 18X1 1/2 (NEEDLE) ×2 IMPLANT
NEEDLE CAPSULORHEX 25GA (NEEDLE) ×2 IMPLANT
NEEDLE FILTER BLUNT 18X 1/2SAF (NEEDLE) ×2
NEEDLE FILTER BLUNT 18X1 1/2 (NEEDLE) ×2 IMPLANT
PACK CATARACT BRASINGTON (MISCELLANEOUS) ×2 IMPLANT
PACK EYE AFTER SURG (MISCELLANEOUS) ×2 IMPLANT
PACK OPTHALMIC (MISCELLANEOUS) ×2 IMPLANT
SOLUTION OPHTHALMIC SALT (MISCELLANEOUS) ×2 IMPLANT
SYR 3ML LL SCALE MARK (SYRINGE) ×4 IMPLANT
SYR TB 1ML LUER SLIP (SYRINGE) ×2 IMPLANT
WATER STERILE IRR 250ML POUR (IV SOLUTION) ×2 IMPLANT
WIPE NON LINTING 3.25X3.25 (MISCELLANEOUS) ×2 IMPLANT

## 2019-11-25 NOTE — Anesthesia Postprocedure Evaluation (Signed)
Anesthesia Post Note  Patient: Mary Vasquez  Procedure(s) Performed: CATARACT EXTRACTION PHACO AND INTRAOCULAR LENS PLACEMENT (IOC) LEFT 9.58 01:13.8 12.9% (Left Eye)     Patient location during evaluation: PACU Anesthesia Type: MAC Level of consciousness: awake and alert Pain management: pain level controlled Vital Signs Assessment: post-procedure vital signs reviewed and stable Respiratory status: spontaneous breathing Cardiovascular status: blood pressure returned to baseline Postop Assessment: no apparent nausea or vomiting, adequate PO intake and no headache Anesthetic complications: no    Adele Barthel Philmore Lepore

## 2019-11-25 NOTE — Anesthesia Procedure Notes (Signed)
Procedure Name: MAC Date/Time: 11/25/2019 12:41 PM Performed by: Vanetta Shawl, CRNA Pre-anesthesia Checklist: Patient identified, Emergency Drugs available, Suction available, Timeout performed and Patient being monitored Patient Re-evaluated:Patient Re-evaluated prior to induction Oxygen Delivery Method: Nasal cannula Placement Confirmation: positive ETCO2

## 2019-11-25 NOTE — Op Note (Signed)
OPERATIVE NOTE  Mary Vasquez [id number] 11/25/2019   PREOPERATIVE DIAGNOSIS:  Nuclear sclerotic cataract left eye. H25.12   POSTOPERATIVE DIAGNOSIS:    Nuclear sclerotic cataract left eye.     PROCEDURE:  Phacoemusification with posterior chamber intraocular lens placement of the left eye  Ultrasound time: Procedure(s): CATARACT EXTRACTION PHACO AND INTRAOCULAR LENS PLACEMENT (IOC) LEFT 9.58 01:13.8 12.9% (Left)  LENS:   Implant Name Type Inv. Item Serial No. Manufacturer Lot No. LRB No. Used Action  LENS IOL DIOP 23.5 - [id number] Intraocular Lens LENS IOL DIOP 23.5 [id number]  Left 1 Implanted      SURGEON:  Deirdre Evener, MD   ANESTHESIA:  Topical with tetracaine drops and 2% Xylocaine jelly, augmented with 1% preservative-free intracameral lidocaine.    COMPLICATIONS:  None.   DESCRIPTION OF PROCEDURE:  The patient was identified in the holding room and transported to the operating room and placed in the supine position under the operating microscope.  The left eye was identified as the operative eye and it was prepped and draped in the usual sterile ophthalmic fashion.   A 1 millimeter clear-corneal paracentesis was made at the 1:30 position.  0.5 ml of preservative-free 1% lidocaine was injected into the anterior chamber.  The anterior chamber was filled with Viscoat viscoelastic.  A 2.4 millimeter keratome was used to make a near-clear corneal incision at the 10:30 position.  .  A curvilinear capsulorrhexis was made with a cystotome and capsulorrhexis forceps.  Balanced salt solution was used to hydrodissect and hydrodelineate the nucleus.   Phacoemulsification was then used in stop and chop fashion to remove the lens nucleus and epinucleus.  The remaining cortex was then removed using the irrigation and aspiration handpiece. Provisc was then placed into the capsular bag to distend it for lens placement.  A lens was then injected into the capsular bag.  The  remaining viscoelastic was aspirated.   Wounds were hydrated with balanced salt solution.  The anterior chamber was inflated to a physiologic pressure with balanced salt solution.  No wound leaks were noted. Vigamox 0.2 ml of a 1mg  per ml solution was injected into the anterior chamber for a dose of 0.2 mg of intracameral antibiotic at the completion of the case.   Timolol and Brimonidine drops were applied to the eye.  The patient was taken to the recovery room in stable condition without complications of anesthesia or surgery.  Mary Vasquez 11/25/2019, 12:56 PM

## 2019-11-25 NOTE — Anesthesia Preprocedure Evaluation (Signed)
Anesthesia Evaluation  Patient identified by MRN, date of birth, ID band Patient awake    History of Anesthesia Complications Negative for: history of anesthetic complications  Airway Mallampati: II  TM Distance: >3 FB Neck ROM: Full    Dental no notable dental hx.    Pulmonary neg pulmonary ROS,    Pulmonary exam normal        Cardiovascular Exercise Tolerance: Good negative cardio ROS Normal cardiovascular exam     Neuro/Psych negative neurological ROS     GI/Hepatic negative GI ROS, Neg liver ROS,   Endo/Other  Hypothyroidism   Renal/GU negative Renal ROS     Musculoskeletal   Abdominal   Peds  Hematology negative hematology ROS (+)   Anesthesia Other Findings   Reproductive/Obstetrics                            Anesthesia Physical Anesthesia Plan  ASA: II  Anesthesia Plan: MAC   Post-op Pain Management:    Induction: Intravenous  PONV Risk Score and Plan: 2 and Midazolam and Treatment may vary due to age or medical condition  Airway Management Planned: Nasal Cannula and Natural Airway  Additional Equipment: None  Intra-op Plan:   Post-operative Plan:   Informed Consent: I have reviewed the patients History and Physical, chart, labs and discussed the procedure including the risks, benefits and alternatives for the proposed anesthesia with the patient or authorized representative who has indicated his/her understanding and acceptance.       Plan Discussed with:   Anesthesia Plan Comments:         Anesthesia Quick Evaluation

## 2019-11-25 NOTE — Transfer of Care (Signed)
Immediate Anesthesia Transfer of Care Note  Patient: Mary Vasquez  Procedure(s) Performed: CATARACT EXTRACTION PHACO AND INTRAOCULAR LENS PLACEMENT (IOC) LEFT 9.58 01:13.8 12.9% (Left Eye)  Patient Location: PACU  Anesthesia Type: MAC  Level of Consciousness: awake, alert  and patient cooperative  Airway and Oxygen Therapy: Patient Spontanous Breathing   Post-op Assessment: Post-op Vital signs reviewed, Patient's Cardiovascular Status Stable, Respiratory Function Stable, Patent Airway and No signs of Nausea or vomiting  Post-op Vital Signs: Reviewed and stable  Complications: No apparent anesthesia complications

## 2019-11-25 NOTE — H&P (Signed)

## 2019-11-26 ENCOUNTER — Encounter: Payer: Self-pay | Admitting: *Deleted

## 2019-12-04 DIAGNOSIS — H2511 Age-related nuclear cataract, right eye: Secondary | ICD-10-CM | POA: Diagnosis not present

## 2019-12-14 ENCOUNTER — Encounter: Payer: Self-pay | Admitting: Ophthalmology

## 2019-12-21 ENCOUNTER — Other Ambulatory Visit: Payer: Self-pay

## 2019-12-21 ENCOUNTER — Other Ambulatory Visit
Admission: RE | Admit: 2019-12-21 | Discharge: 2019-12-21 | Disposition: A | Discharge status: Home / Self Care | Payer: PPO | Source: Ambulatory Visit | Attending: Ophthalmology | Admitting: Ophthalmology

## 2019-12-21 DIAGNOSIS — Z20822 Contact with and (suspected) exposure to covid-19: Secondary | ICD-10-CM | POA: Insufficient documentation

## 2019-12-21 DIAGNOSIS — Z01812 Encounter for preprocedural laboratory examination: Secondary | ICD-10-CM | POA: Diagnosis not present

## 2019-12-21 LAB — SARS CORONAVIRUS 2 (TAT 6-24 HRS): SARS Coronavirus 2: NEGATIVE

## 2019-12-22 NOTE — Discharge Instructions (Signed)

## 2019-12-23 ENCOUNTER — Encounter: Payer: Self-pay | Admitting: Ophthalmology

## 2019-12-23 ENCOUNTER — Ambulatory Visit: Payer: PPO | Admitting: Anesthesiology

## 2019-12-23 ENCOUNTER — Encounter
Admission: RE | Disposition: A | Discharge status: Home / Self Care | Payer: Self-pay | Source: Home / Self Care | Attending: Ophthalmology

## 2019-12-23 ENCOUNTER — Ambulatory Visit
Admission: RE | Admit: 2019-12-23 | Discharge: 2019-12-23 | Disposition: A | Discharge status: Home / Self Care | Payer: PPO | Attending: Ophthalmology | Admitting: Ophthalmology

## 2019-12-23 DIAGNOSIS — E039 Hypothyroidism, unspecified: Secondary | ICD-10-CM | POA: Diagnosis not present

## 2019-12-23 DIAGNOSIS — Z79899 Other long term (current) drug therapy: Secondary | ICD-10-CM | POA: Insufficient documentation

## 2019-12-23 DIAGNOSIS — F4320 Adjustment disorder, unspecified: Secondary | ICD-10-CM | POA: Diagnosis not present

## 2019-12-23 DIAGNOSIS — N183 Chronic kidney disease, stage 3 unspecified: Secondary | ICD-10-CM | POA: Insufficient documentation

## 2019-12-23 DIAGNOSIS — I10 Essential (primary) hypertension: Secondary | ICD-10-CM | POA: Diagnosis not present

## 2019-12-23 DIAGNOSIS — Z6830 Body mass index (BMI) 30.0-30.9, adult: Secondary | ICD-10-CM | POA: Diagnosis not present

## 2019-12-23 DIAGNOSIS — F329 Major depressive disorder, single episode, unspecified: Secondary | ICD-10-CM | POA: Diagnosis not present

## 2019-12-23 DIAGNOSIS — Z79890 Hormone replacement therapy (postmenopausal): Secondary | ICD-10-CM | POA: Insufficient documentation

## 2019-12-23 DIAGNOSIS — E669 Obesity, unspecified: Secondary | ICD-10-CM | POA: Insufficient documentation

## 2019-12-23 DIAGNOSIS — E785 Hyperlipidemia, unspecified: Secondary | ICD-10-CM | POA: Diagnosis not present

## 2019-12-23 DIAGNOSIS — H25811 Combined forms of age-related cataract, right eye: Secondary | ICD-10-CM | POA: Diagnosis not present

## 2019-12-23 DIAGNOSIS — H2511 Age-related nuclear cataract, right eye: Secondary | ICD-10-CM | POA: Diagnosis not present

## 2019-12-23 HISTORY — PX: CATARACT EXTRACTION W/PHACO: SHX586

## 2019-12-23 SURGERY — PHACOEMULSIFICATION, CATARACT, WITH IOL INSERTION
Anesthesia: Monitor Anesthesia Care | Site: Eye | Laterality: Right

## 2019-12-23 MED ORDER — MOXIFLOXACIN HCL 0.5 % OP SOLN
OPHTHALMIC | Status: DC | PRN
  Administered 2019-12-23: 0.2 mL via OPHTHALMIC

## 2019-12-23 MED ORDER — NA HYALUR & NA CHOND-NA HYALUR 0.4-0.35 ML IO KIT
PACK | INTRAOCULAR | Status: DC | PRN
  Administered 2019-12-23: 1 mL via INTRAOCULAR

## 2019-12-23 MED ORDER — FENTANYL CITRATE (PF) 100 MCG/2ML IJ SOLN
INTRAMUSCULAR | Status: DC | PRN
  Administered 2019-12-23: 50 ug via INTRAVENOUS

## 2019-12-23 MED ORDER — ACETAMINOPHEN 160 MG/5ML PO SOLN: 325.0000 mg | Freq: Once | ORAL | Status: DC

## 2019-12-23 MED ORDER — MIDAZOLAM HCL 2 MG/2ML IJ SOLN
INTRAMUSCULAR | Status: DC | PRN
  Administered 2019-12-23: .5 mg via INTRAVENOUS
  Administered 2019-12-23: 1 mg via INTRAVENOUS

## 2019-12-23 MED ORDER — MOXIFLOXACIN HCL 0.5 % OP SOLN
1.0000 [drp] | OPHTHALMIC | Status: DC | PRN
  Administered 2019-12-23 (×3): 1 [drp] via OPHTHALMIC

## 2019-12-23 MED ORDER — LIDOCAINE HCL (PF) 2 % IJ SOLN
INTRAOCULAR | Status: DC | PRN
  Administered 2019-12-23: 1 mL

## 2019-12-23 MED ORDER — LACTATED RINGERS IV SOLN: 10.0000 mL/h | INTRAVENOUS | Status: DC

## 2019-12-23 MED ORDER — ARMC OPHTHALMIC DILATING DROPS
1.0000 "application " | OPHTHALMIC | Status: DC | PRN
  Administered 2019-12-23 (×3): 1 via OPHTHALMIC

## 2019-12-23 MED ORDER — EPINEPHRINE PF 1 MG/ML IJ SOLN
INTRAOCULAR | Status: DC | PRN
  Administered 2019-12-23: 67 mL via OPHTHALMIC

## 2019-12-23 MED ORDER — BRIMONIDINE TARTRATE-TIMOLOL 0.2-0.5 % OP SOLN
OPHTHALMIC | Status: DC | PRN
  Administered 2019-12-23: 1 [drp] via OPHTHALMIC

## 2019-12-23 MED ORDER — TETRACAINE HCL 0.5 % OP SOLN
1.0000 [drp] | OPHTHALMIC | Status: DC | PRN
  Administered 2019-12-23 (×3): 1 [drp] via OPHTHALMIC

## 2019-12-23 MED ORDER — ACETAMINOPHEN 325 MG PO TABS: 325.0000 mg | ORAL_TABLET | Freq: Once | ORAL | Status: DC

## 2019-12-23 SURGICAL SUPPLY — 20 items
CANNULA ANT/CHMB 27GA (MISCELLANEOUS) ×3 IMPLANT
GLOVE SURG LX 7.5 STRW (GLOVE) ×2
GLOVE SURG LX STRL 7.5 STRW (GLOVE) ×1 IMPLANT
GLOVE SURG TRIUMPH 8.0 PF LTX (GLOVE) ×3 IMPLANT
GOWN STRL REUS W/ TWL LRG LVL3 (GOWN DISPOSABLE) ×2 IMPLANT
GOWN STRL REUS W/TWL LRG LVL3 (GOWN DISPOSABLE) ×4
LENS IOL DIOP 22.5 (Intraocular Lens) ×3 IMPLANT
LENS IOL TECNIS MONO 22.5 (Intraocular Lens) ×1 IMPLANT
MARKER SKIN DUAL TIP RULER LAB (MISCELLANEOUS) ×3 IMPLANT
NEEDLE CAPSULORHEX 25GA (NEEDLE) ×3 IMPLANT
NEEDLE FILTER BLUNT 18X 1/2SAF (NEEDLE) ×4
NEEDLE FILTER BLUNT 18X1 1/2 (NEEDLE) ×2 IMPLANT
PACK CATARACT BRASINGTON (MISCELLANEOUS) ×3 IMPLANT
PACK EYE AFTER SURG (MISCELLANEOUS) ×3 IMPLANT
PACK OPTHALMIC (MISCELLANEOUS) ×3 IMPLANT
SOLUTION OPHTHALMIC SALT (MISCELLANEOUS) ×3 IMPLANT
SYR 3ML LL SCALE MARK (SYRINGE) ×6 IMPLANT
SYR TB 1ML LUER SLIP (SYRINGE) ×3 IMPLANT
WATER STERILE IRR 250ML POUR (IV SOLUTION) ×3 IMPLANT
WIPE NON LINTING 3.25X3.25 (MISCELLANEOUS) ×3 IMPLANT

## 2019-12-23 NOTE — H&P (Signed)

## 2019-12-23 NOTE — Anesthesia Procedure Notes (Signed)
Procedure Name: MAC Date/Time: 12/23/2019 9:02 AM Performed by: Vanetta Shawl, CRNA Pre-anesthesia Checklist: Patient identified, Emergency Drugs available, Suction available, Timeout performed and Patient being monitored Patient Re-evaluated:Patient Re-evaluated prior to induction Oxygen Delivery Method: Nasal cannula Placement Confirmation: positive ETCO2

## 2019-12-23 NOTE — Op Note (Signed)
LOCATION:  [facility]   PREOPERATIVE DIAGNOSIS:    Nuclear sclerotic cataract right eye. H25.11   POSTOPERATIVE DIAGNOSIS:  Nuclear sclerotic cataract right eye.     PROCEDURE:  Phacoemusification with posterior chamber intraocular lens placement of the right eye   ULTRASOUND TIME: Procedure(s): CATARACT EXTRACTION PHACO AND INTRAOCULAR LENS PLACEMENT (IOC) RIGHT 12.74  01:08.4  18.6% (Right)  LENS:   Implant Name Type Inv. Item Serial No. Manufacturer Lot No. LRB No. Used Action  LENS IOL DIOP 22.5 - [id number] Intraocular Lens LENS IOL DIOP 22.5 [id number]  Right 1 Implanted         SURGEON:  Deirdre Evener, MD   ANESTHESIA:  Topical with tetracaine drops and 2% Xylocaine jelly, augmented with 1% preservative-free intracameral lidocaine.    COMPLICATIONS:  None.   DESCRIPTION OF PROCEDURE:  The patient was identified in the holding room and transported to the operating room and placed in the supine position under the operating microscope.  The right eye was identified as the operative eye and it was prepped and draped in the usual sterile ophthalmic fashion.   A 1 millimeter clear-corneal paracentesis was made at the 12:00 position.  0.5 ml of preservative-free 1% lidocaine was injected into the anterior chamber. The anterior chamber was filled with Viscoat viscoelastic.  A 2.4 millimeter keratome was used to make a near-clear corneal incision at the 9:00 position.  A curvilinear capsulorrhexis was made with a cystotome and capsulorrhexis forceps.  Balanced salt solution was used to hydrodissect and hydrodelineate the nucleus.   Phacoemulsification was then used in stop and chop fashion to remove the lens nucleus and epinucleus.  The remaining cortex was then removed using the irrigation and aspiration handpiece. Provisc was then placed into the capsular bag to distend it for lens placement.  A lens was then injected into the capsular bag.  The remaining  viscoelastic was aspirated.   Wounds were hydrated with balanced salt solution.  The anterior chamber was inflated to a physiologic pressure with balanced salt solution.  No wound leaks were noted. Cefuroxime 0.1 ml of a 10mg /ml solution was injected into the anterior chamber for a dose of 1 mg of intracameral antibiotic at the completion of the case.   Timolol and Brimonidine drops were applied to the eye.  The patient was taken to the recovery room in stable condition without complications of anesthesia or surgery.   Mary Vasquez 12/23/2019, 9:22 AM

## 2019-12-23 NOTE — Anesthesia Postprocedure Evaluation (Signed)
Anesthesia Post Note  Patient: Mary Vasquez  Procedure(s) Performed: CATARACT EXTRACTION PHACO AND INTRAOCULAR LENS PLACEMENT (IOC) RIGHT 12.74  01:08.4  18.6% (Right Eye)     Patient location during evaluation: PACU Anesthesia Type: MAC Level of consciousness: awake and alert and oriented Pain management: satisfactory to patient Vital Signs Assessment: post-procedure vital signs reviewed and stable Respiratory status: spontaneous breathing, nonlabored ventilation and respiratory function stable Cardiovascular status: blood pressure returned to baseline and stable Postop Assessment: Adequate PO intake and No signs of nausea or vomiting Anesthetic complications: no   No complications documented.  Raliegh Ip

## 2019-12-23 NOTE — Transfer of Care (Signed)
Immediate Anesthesia Transfer of Care Note  Patient: Mary Vasquez  Procedure(s) Performed: CATARACT EXTRACTION PHACO AND INTRAOCULAR LENS PLACEMENT (IOC) RIGHT 12.74  01:08.4  18.6% (Right Eye)  Patient Location: PACU  Anesthesia Type: MAC  Level of Consciousness: awake, alert  and patient cooperative  Airway and Oxygen Therapy: Patient Spontanous Breathing   Post-op Assessment: Post-op Vital signs reviewed, Patient's Cardiovascular Status Stable, Respiratory Function Stable, Patent Airway and No signs of Nausea or vomiting  Post-op Vital Signs: Reviewed and stable  Complications: No complications documented.

## 2019-12-23 NOTE — Anesthesia Preprocedure Evaluation (Signed)
Anesthesia Evaluation  Patient identified by MRN, date of birth, ID band Patient awake    Reviewed: Allergy & Precautions, H&P , NPO status , Patient's Chart, lab work & pertinent test results  History of Anesthesia Complications Negative for: history of anesthetic complications  Airway Mallampati: II  TM Distance: >3 FB Neck ROM: full    Dental no notable dental hx.    Pulmonary neg pulmonary ROS,    Pulmonary exam normal breath sounds clear to auscultation       Cardiovascular Exercise Tolerance: Good hypertension, negative cardio ROS Normal cardiovascular exam Rhythm:regular Rate:Normal     Neuro/Psych negative neurological ROS     GI/Hepatic negative GI ROS, Neg liver ROS,   Endo/Other  Hypothyroidism   Renal/GU negative Renal ROS     Musculoskeletal   Abdominal   Peds  Hematology negative hematology ROS (+)   Anesthesia Other Findings   Reproductive/Obstetrics                             Anesthesia Physical  Anesthesia Plan  ASA: II  Anesthesia Plan: MAC   Post-op Pain Management:    Induction: Intravenous  PONV Risk Score and Plan: 2 and Treatment may vary due to age or medical condition, TIVA and Midazolam  Airway Management Planned: Nasal Cannula and Natural Airway  Additional Equipment: None  Intra-op Plan:   Post-operative Plan:   Informed Consent: I have reviewed the patients History and Physical, chart, labs and discussed the procedure including the risks, benefits and alternatives for the proposed anesthesia with the patient or authorized representative who has indicated his/her understanding and acceptance.     Dental Advisory Given  Plan Discussed with: CRNA  Anesthesia Plan Comments:         Anesthesia Quick Evaluation

## 2019-12-24 ENCOUNTER — Encounter: Payer: Self-pay | Admitting: Ophthalmology

## 2019-12-30 ENCOUNTER — Encounter: Payer: Self-pay | Admitting: Internal Medicine

## 2020-01-18 ENCOUNTER — Ambulatory Visit
Admission: RE | Admit: 2020-01-18 | Discharge: 2020-01-18 | Disposition: A | Discharge status: Home / Self Care | Payer: PPO | Source: Ambulatory Visit | Attending: Internal Medicine | Admitting: Internal Medicine

## 2020-01-18 DIAGNOSIS — Z780 Asymptomatic menopausal state: Secondary | ICD-10-CM | POA: Diagnosis not present

## 2020-01-18 DIAGNOSIS — M85852 Other specified disorders of bone density and structure, left thigh: Secondary | ICD-10-CM | POA: Diagnosis not present

## 2020-01-26 ENCOUNTER — Other Ambulatory Visit: Payer: Self-pay | Admitting: Internal Medicine

## 2020-02-17 ENCOUNTER — Encounter: Payer: Self-pay | Admitting: Internal Medicine

## 2020-02-17 ENCOUNTER — Other Ambulatory Visit: Payer: Self-pay

## 2020-02-17 ENCOUNTER — Ambulatory Visit (INDEPENDENT_AMBULATORY_CARE_PROVIDER_SITE_OTHER): Payer: PPO | Admitting: Internal Medicine

## 2020-02-17 VITALS — BP 132/86 | HR 68 | Temp 97.6°F | Resp 15 | Ht 61.0 in | Wt 161.8 lb

## 2020-02-17 DIAGNOSIS — E034 Atrophy of thyroid (acquired): Secondary | ICD-10-CM

## 2020-02-17 DIAGNOSIS — F418 Other specified anxiety disorders: Secondary | ICD-10-CM

## 2020-02-17 DIAGNOSIS — F339 Major depressive disorder, recurrent, unspecified: Secondary | ICD-10-CM

## 2020-02-17 DIAGNOSIS — N1831 Chronic kidney disease, stage 3a: Secondary | ICD-10-CM | POA: Diagnosis not present

## 2020-02-17 DIAGNOSIS — R7301 Impaired fasting glucose: Secondary | ICD-10-CM

## 2020-02-17 DIAGNOSIS — F5105 Insomnia due to other mental disorder: Secondary | ICD-10-CM | POA: Diagnosis not present

## 2020-02-17 DIAGNOSIS — E785 Hyperlipidemia, unspecified: Secondary | ICD-10-CM | POA: Diagnosis not present

## 2020-02-17 LAB — COMPREHENSIVE METABOLIC PANEL
ALT: 15 U/L (ref 0–35)
AST: 26 U/L (ref 0–37)
Albumin: 4.2 g/dL (ref 3.5–5.2)
Alkaline Phosphatase: 62 U/L (ref 39–117)
BUN: 15 mg/dL (ref 6–23)
CO2: 26 mEq/L (ref 19–32)
Calcium: 9.4 mg/dL (ref 8.4–10.5)
Chloride: 104 mEq/L (ref 96–112)
Creatinine, Ser: 1.1 mg/dL (ref 0.40–1.20)
GFR: 48 mL/min — ABNORMAL LOW (ref >60.00)
Glucose, Bld: 105 mg/dL — ABNORMAL HIGH (ref 70–99)
Potassium: 4 mEq/L (ref 3.5–5.1)
Sodium: 140 mEq/L (ref 135–145)
Total Bilirubin: 0.8 mg/dL (ref 0.2–1.2)
Total Protein: 7.1 g/dL (ref 6.0–8.3)

## 2020-02-17 LAB — LIPID PANEL
Cholesterol: 184 mg/dL (ref 0–200)
HDL: 71.2 mg/dL (ref >39.00)
LDL Cholesterol: 96 mg/dL (ref 0–99)
NonHDL: 112.99
Total CHOL/HDL Ratio: 3
Triglycerides: 85 mg/dL (ref 0.0–149.0)
VLDL: 17 mg/dL (ref 0.0–40.0)

## 2020-02-17 LAB — TSH: TSH: 2.69 u[IU]/mL (ref 0.35–4.50)

## 2020-02-17 LAB — HEMOGLOBIN A1C: Hgb A1c MFr Bld: 5.8 % (ref 4.6–6.5)

## 2020-02-17 MED ORDER — SERTRALINE HCL 50 MG PO TABS
50.0000 mg | ORAL_TABLET | Freq: Every day | ORAL | 3 refills | Status: DC
Start: 2020-02-17 — End: 2020-03-15

## 2020-02-17 NOTE — Patient Instructions (Signed)
Please start the  Zoloft (sertraline) at 1/2 tablet daily after lunch for  the first few week to avoid nausea.  You can increase to a full tablet after  6 to 8  days if you havenot developed side effects of nausea.   For the sleep issues:  I recommend trying melatonin for your insomnia.  It is not a sedative,  But must be taken on  a regular basis to help your internal clock.  Take every evening after dinner start with 5 mg dose   Max effective dose is 10 mg   Please return in  4 weeks ,  Or e mail me to let me know how it is helping your depression

## 2020-02-17 NOTE — Progress Notes (Signed)
Subjective:  Patient ID: Mary Vasquez, female    DOB: 09-Jan-1941  Age: 79 y.o. MRN: [id number]  CC: The primary encounter diagnosis was Stage 3a chronic kidney disease. Diagnoses of Hypothyroidism due to acquired atrophy of thyroid, Hyperlipidemia with target LDL less than 100, Impaired fasting glucose, Major depressive disorder, recurrent episode with melancholic features (HCC), and Insomnia secondary to depression with anxiety were also pertinent to this visit.  HPI Lorianna C Roel presents for FOLLOW UP ON HYPOTHYROID,  CKD AND  HYPERLIPIDEMIA  At her last visit she was experiencing COMPLICATED GRIEF,  NOW positive screen for DEPRESSION AND ANXIETY.  Daughter died suddenly and alone in October,   SISTER IN LAW DIED  LAST WEEK WIT FAMILY GATHERED AT BEDSIDE after a hospitalization .  And boyfriend has a progressive dementia .   Falls asleep ok, but wakes up frequently and awake before alarm goes off. Daytime energy  Is low, no motivation. Not dozing during the day. Overeating on comfort foods  Outpatient Medications Prior to Visit  Medication Sig Dispense Refill  . atorvastatin (LIPITOR) 20 MG tablet Take 1 tablet (20 mg total) by mouth daily. 90 tablet 3  . cetirizine (ZYRTEC) 10 MG tablet Take 10 mg by mouth daily.    . Cholecalciferol (VITAMIN D3) 25 MCG (1000 UT) CAPS Take 1,000 Units by mouth 2 (two) times daily.    Marland Kitchen. levothyroxine (SYNTHROID) 25 MCG tablet TAKE 1 TABLET BY MOUTH DAILY BEFORE BREAKFAST EXCEPT ON SUNDAY AND THURSDAY TAKE 2 TABS 114 tablet 1  . Probiotic Product (TRUBIOTICS PO) Take by mouth daily.    . Turmeric Curcumin 500 MG CAPS Take by mouth daily.    Marland Kitchen. acetaminophen (TYLENOL) 500 MG tablet Take 1,000 mg by mouth every 6 (six) hours as needed for moderate pain or headache. (Patient not taking: Reported on 02/17/2020)    . amLODipine (NORVASC) 5 MG tablet TAKE 1 TABLET BY MOUTH EVERY DAY (Patient not taking: Reported on 09/05/2018) 90 tablet 1  . dimenhyDRINATE  (DRAMAMINE) 50 MG tablet Take 50 mg by mouth every 8 (eight) hours as needed for dizziness. (Patient not taking: Reported on 02/17/2020)     No facility-administered medications prior to visit.    Review of Systems;  Patient denies headache, fevers, malaise, unintentional weight loss, skin rash, eye pain, sinus congestion and sinus pain, sore throat, dysphagia,  hemoptysis , cough, dyspnea, wheezing, chest pain, palpitations, orthopnea, edema, abdominal pain, nausea, melena, diarrhea, constipation, flank pain, dysuria, hematuria, urinary  Frequency, nocturia, numbness, tingling, seizures,  Focal weakness, Loss of consciousness,  Tremor, anxiety, and suicidal ideation.      Objective:  BP 132/86 (BP Location: Left Arm, Patient Position: Sitting, Cuff Size: Normal)   Pulse 68   Temp 97.6 F (36.4 C) (Oral)   Resp 15   Ht 5\' 1"  (1.549 m)   Wt 161 lb 12.8 oz (73.4 kg)   SpO2 98%   BMI 30.57 kg/m   BP Readings from Last 3 Encounters:  02/17/20 132/86  12/23/19 131/74  11/25/19 122/71    Wt Readings from Last 3 Encounters:  02/17/20 161 lb 12.8 oz (73.4 kg)  12/23/19 162 lb (73.5 kg)  11/25/19 163 lb (73.9 kg)    General appearance: alert, cooperative and appears stated age Ears: normal TM's and external ear canals both ears Throat: lips, mucosa, and tongue normal; teeth and gums normal Neck: no adenopathy, no carotid bruit, supple, symmetrical, trachea midline and thyroid not enlarged, symmetric, no tenderness/mass/nodules  Back: symmetric, no curvature. ROM normal. No CVA tenderness. Lungs: clear to auscultation bilaterally Heart: regular rate and rhythm, S1, S2 normal, no murmur, click, rub or gallop Abdomen: soft, non-tender; bowel sounds normal; no masses,  no organomegaly Pulses: 2+ and symmetric Skin: Skin color, texture, turgor normal. No rashes or lesions Lymph nodes: Cervical, supraclavicular, and axillary nodes normal.  Lab Results  Component Value Date   HGBA1C  5.8 02/17/2020   HGBA1C 5.6 04/29/2019   HGBA1C 5.7 12/06/2017    Lab Results  Component Value Date   CREATININE 1.10 02/17/2020   CREATININE 1.05 08/14/2019   CREATININE 1.06 04/29/2019    Lab Results  Component Value Date   WBC 5.8 09/05/2018   HGB 12.5 09/05/2018   HCT 37.0 09/05/2018   PLT 291.0 09/05/2018   GLUCOSE 105 (H) 02/17/2020   CHOL 184 02/17/2020   TRIG 85.0 02/17/2020   HDL 71.20 02/17/2020   LDLDIRECT 91.0 09/09/2015   LDLCALC 96 02/17/2020   ALT 15 02/17/2020   AST 26 02/17/2020   NA 140 02/17/2020   K 4.0 02/17/2020   CL 104 02/17/2020   CREATININE 1.10 02/17/2020   BUN 15 02/17/2020   CO2 26 02/17/2020   TSH 2.69 02/17/2020   INR 1.11 07/31/2018   HGBA1C 5.8 02/17/2020   MICROALBUR <0.7 12/10/2017    DG Bone Density  Result Date: 01/18/2020 EXAM: DUAL X-RAY ABSORPTIOMETRY (DXA) FOR BONE MINERAL DENSITY IMPRESSION: Dear Dr. Darrick Huntsman, Your patient Conchita C Knierim completed a FRAX assessment on 01/18/2020 using the [employer] iDXA DXA System (analysis version: 14.10) manufactured by [employer]. The following summarizes the results of our evaluation. PATIENT BIOGRAPHICAL: Name: Litsy, Epting Patient ID: [id number] Birth Date: July 06, 1941 Height:    61.0 in. Gender:     Female    [age]       Weight:    164.6 lbs. Ethnicity:  White                            Exam Date: 01/18/2020 FRAX* RESULTS:  (version: 3.5) 10-year Probability of Fracture1 Major Osteoporotic Fracture2 Hip Fracture 14.1% 3.8% Population: [place] (Caucasian) Risk Factors: None Based on Femur (Left) Neck BMD 1 -The 10-year probability of fracture may be lower than reported if the patient has received treatment. 2 -Major Osteoporotic Fracture: Clinical Spine, Forearm, Hip or Shoulder *FRAX is a Armed forces logistics/support/administrative officer of the [employer] for Metabolic Bone Disease, a World Science writer (WHO) [employer]. ASSESSMENT: The probability of a major osteoporotic  fracture is 14.1% within the next ten years. The probability of a hip fracture is 3.8% within the next ten years. . Your patient Sydnei Ohaver completed a BMD test on 01/18/2020 using the [employer] DXA System (software version: 14.10) manufactured by [employer]. The following summarizes the results of our evaluation. Technologist: Daine Floras PATIENT BIOGRAPHICAL: Name: Deola, Rewis Patient ID: [id number] Birth Date: Feb 16, 1941 Height: 61.0 in. Gender: Female Exam Date: 01/18/2020 Weight: 164.6 lbs. Indications: Advanced Age, Caucasian, Hypothyroid, Postmenopausal, right hip replacement Fractures: Treatments: Levothyroxine, Vitamin D DENSITOMETRY RESULTS: Site          Region     Measured Date Measured Age WHO Classification Young Adult T-score BMD         %Change vs. Previous Significant Change (*) Left Femur Neck 01/18/[age].2 Osteopenia -1.9 0.770 g/cm2 Right Forearm Radius 33% 01/18/2020 78.2  Normal -0.7 0.818 g/cm2 ASSESSMENT: The BMD measured at Femur Neck is 0.770 g/cm2 with a T-score of -1.9. This patient is considered osteopenic according to World Health Organization [facility]) criteria. Lumbar spine was not utilized due to scoliosis. Right femur was excluded due to surgical hardware. The scan quality is good. World Science writer [facility]) criteria for post-menopausal, Caucasian Women: Normal:                   T-score at or above -1 SD Osteopenia/low bone mass: T-score between -1 and -2.5 SD Osteoporosis:             T-score at or below -2.5 SD RECOMMENDATIONS: 1. All patients should optimize calcium and vitamin D intake. 2. Consider FDA-approved medical therapies in postmenopausal women and men aged 61 years and older, based on the following: a. A hip or vertebral(clinical or morphometric) fracture b. T-score < -2.5 at the femoral neck or spine after appropriate evaluation to exclude secondary causes c. Low bone mass (T-score between -1.0 and -2.5 at the femoral neck or spine) and a 10-year  probability of a hip fracture > 3% or a 10-year probability of a major osteoporosis-related fracture > 20% based on the [place]-adapted WHO algorithm 3. Clinician judgment and/or patient preferences may indicate treatment for people with 10-year fracture probabilities above or below these levels FOLLOW-UP: People with diagnosed cases of osteoporosis or at high risk for fracture should have regular bone mineral density tests. For patients eligible for Medicare, routine testing is allowed once every 2 years. The testing frequency can be increased to one year for patients who have rapidly progressing disease, those who are receiving or discontinuing medical therapy to restore bone mass, or have additional risk factors. I have reviewed this report, and agree with the above findings. [facility] Electronically Signed   By: Bretta Bang III M.D.   On: 01/18/2020 13:52    Assessment & Plan:   Problem List Items Addressed This Visit      Unprioritized   CKD (chronic kidney disease) stage 3, GFR 30-59 ml/min - Primary   Relevant Orders   Basic metabolic panel   Hyperlipidemia with target LDL less than 100   Relevant Orders   Lipid panel (Completed)   Hypothyroidism   Relevant Orders   TSH (Completed)   Insomnia secondary to depression with anxiety    Adding melatonin at dinner time       Relevant Medications   sertraline (ZOLOFT) 50 MG tablet   Major depressive disorder, recurrent episode with melancholic features (HCC)    Triggered by recurrent loss ,  First of daughter,  Then of sister in law.  Trial of sertraline.       Relevant Medications   sertraline (ZOLOFT) 50 MG tablet    Other Visit Diagnoses    Impaired fasting glucose       Relevant Orders   Hemoglobin A1c (Completed)   Comprehensive metabolic panel (Completed)     A total of 40 minutes was spent with patient more than half of which was spent in counseling patient on the above mentioned issues ,counselling, ,  and coordination of care.  I have discontinued Janijah C. Renzulli's acetaminophen, amLODipine, and dimenhyDRINATE. I am also having her start on sertraline. Additionally, I am having her maintain her Vitamin D3, atorvastatin, cetirizine, Turmeric Curcumin, Probiotic Product (TRUBIOTICS PO), and levothyroxine.  Meds ordered this encounter  Medications  . sertraline (ZOLOFT) 50 MG tablet    Sig: Take  1 tablet (50 mg total) by mouth daily.    Dispense:  30 tablet    Refill:  3    Medications Discontinued During This Encounter  Medication Reason  . amLODipine (NORVASC) 5 MG tablet   . acetaminophen (TYLENOL) 500 MG tablet Patient Preference  . dimenhyDRINATE (DRAMAMINE) 50 MG tablet     Follow-up: Return in about 1 month (around 03/19/2020).   Sherlene Shams, MD

## 2020-02-19 DIAGNOSIS — F418 Other specified anxiety disorders: Secondary | ICD-10-CM | POA: Insufficient documentation

## 2020-02-19 NOTE — Assessment & Plan Note (Signed)
Adding melatonin at dinner time

## 2020-02-19 NOTE — Assessment & Plan Note (Signed)
Triggered by recurrent loss ,  First of daughter,  Then of sister in law.  Trial of sertraline.

## 2020-02-22 ENCOUNTER — Telehealth: Payer: Self-pay

## 2020-02-22 ENCOUNTER — Telehealth: Payer: Self-pay | Admitting: Internal Medicine

## 2020-02-22 NOTE — Telephone Encounter (Signed)
See result note message 

## 2020-02-22 NOTE — Telephone Encounter (Signed)
Attempted to call pt. No answer no voicemail.  

## 2020-02-22 NOTE — Telephone Encounter (Signed)
Pt called about lab results. °

## 2020-02-24 ENCOUNTER — Other Ambulatory Visit (INDEPENDENT_AMBULATORY_CARE_PROVIDER_SITE_OTHER): Payer: PPO

## 2020-02-24 ENCOUNTER — Other Ambulatory Visit: Payer: Self-pay

## 2020-02-24 DIAGNOSIS — N1831 Chronic kidney disease, stage 3a: Secondary | ICD-10-CM | POA: Diagnosis not present

## 2020-02-24 LAB — BASIC METABOLIC PANEL
BUN: 13 mg/dL (ref 6–23)
CO2: 28 mEq/L (ref 19–32)
Calcium: 9 mg/dL (ref 8.4–10.5)
Chloride: 99 mEq/L (ref 96–112)
Creatinine, Ser: 1.1 mg/dL (ref 0.40–1.20)
GFR: 47.99 mL/min — ABNORMAL LOW (ref >60.00)
Glucose, Bld: 93 mg/dL (ref 70–99)
Potassium: 4 mEq/L (ref 3.5–5.1)
Sodium: 135 mEq/L (ref 135–145)

## 2020-02-24 NOTE — Assessment & Plan Note (Signed)
GFR remains < 50 ml/min for the past year.  Nephrology referral advised

## 2020-02-25 ENCOUNTER — Other Ambulatory Visit: Payer: Self-pay | Admitting: Internal Medicine

## 2020-02-25 ENCOUNTER — Telehealth: Payer: Self-pay

## 2020-02-25 DIAGNOSIS — N183 Chronic kidney disease, stage 3 unspecified: Secondary | ICD-10-CM

## 2020-02-25 NOTE — Telephone Encounter (Signed)
LMTCB in regards to lab results.  

## 2020-02-25 NOTE — Telephone Encounter (Signed)
Pt returned your call about labs 

## 2020-02-25 NOTE — Telephone Encounter (Signed)
See result note message 

## 2020-03-12 ENCOUNTER — Other Ambulatory Visit: Payer: Self-pay | Admitting: Internal Medicine

## 2020-03-21 ENCOUNTER — Other Ambulatory Visit: Payer: Self-pay

## 2020-03-21 ENCOUNTER — Encounter: Payer: Self-pay | Admitting: Internal Medicine

## 2020-03-21 ENCOUNTER — Ambulatory Visit (INDEPENDENT_AMBULATORY_CARE_PROVIDER_SITE_OTHER): Payer: PPO | Admitting: Internal Medicine

## 2020-03-21 DIAGNOSIS — N1831 Chronic kidney disease, stage 3a: Secondary | ICD-10-CM

## 2020-03-21 DIAGNOSIS — E663 Overweight: Secondary | ICD-10-CM

## 2020-03-21 DIAGNOSIS — F339 Major depressive disorder, recurrent, unspecified: Secondary | ICD-10-CM

## 2020-03-21 DIAGNOSIS — E785 Hyperlipidemia, unspecified: Secondary | ICD-10-CM

## 2020-03-21 MED ORDER — TRIAMCINOLONE ACETONIDE 0.1 % EX CREA
1.0000 | TOPICAL_CREAM | Freq: Two times a day (BID) | CUTANEOUS | 0 refills | Status: DC
Start: 2020-03-21 — End: 2020-09-21

## 2020-03-21 NOTE — Patient Instructions (Addendum)
   I'm glad you are feeling better with the new medication!  Let me know if you would like to increase the dose or add a low dose of wellbutrin   For the peri orbital rash:  Suspend use of eye cream for now.  Cool compresses work better for swelling .   Triamcinolone ointment 2 times daily until resolved.    If no better in 2 weeks  Let me know .  Ok to use zyrtec two times daily for the itching

## 2020-03-21 NOTE — Assessment & Plan Note (Signed)
Stable over the last 1.5 years.  Nephrology appt in early October .

## 2020-03-21 NOTE — Assessment & Plan Note (Signed)
Improved with atorvastatin .  LFTs are normal.  Lab Results  Component Value Date   CHOL 184 02/17/2020   HDL 71.20 02/17/2020   LDLCALC 96 02/17/2020   LDLDIRECT 91.0 09/09/2015   TRIG 85.0 02/17/2020   CHOLHDL 3 02/17/2020

## 2020-03-21 NOTE — Assessment & Plan Note (Signed)
Weight gain addressed.     Recommended exercise,  Consider adding wellbutirin

## 2020-03-21 NOTE — Assessment & Plan Note (Signed)
improved with sertraline.  Asked to consider adding wellbutrin for fatigue and weight  gain

## 2020-03-21 NOTE — Progress Notes (Signed)
Subjective:  Patient ID: Mary Vasquez, female    DOB: 1940-09-25  Age: 79 y.o. MRN: [id number]  CC: Diagnoses of Stage 3a chronic kidney disease, Hyperlipidemia with target LDL less than 100, Overweight (BMI 25.0-29.9), and Major depressive disorder, recurrent episode with melancholic features (HCC) were pertinent to this visit.  HPI Mary Vasquez presents for follow up on depression and new onset periorbital rash  This visit occurred during the SARS-CoV-2 public health emergency.  Safety protocols were in place, including screening questions prior to the visit, additional usage of staff PPE, and extensive cleaning of exam room while observing appropriate contact time as indicated for disinfecting solutions.   Patient has received both doses of the available COVID 19 vaccine without complications.  Patient continues to mask when outside of the home except when walking in yard or at safe distances from others .  Patient denies any change in mood or development of unhealthy behaviors resuting from the pandemic's restriction of activities and socialization.     Bilateral rash periorbital started one week started in the inner canthus and spread above and below the eyelashes.    Was given Refresh for dry eye by ophthalmologist and started using it in early August /late July . 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 Feeling better.  On sertraline   Stays busy helping brother who was recently widowed. ng better on sertraline.  sleeping better ,feels like she is sleeping too much.  Has gained 7 lbs  Discussed adding wellbutrin  Taking melatonin at 8 :30 pm    .  Misses church family due to boyfriend's apprehension  . 3 grandchildren by her daughter who died last year.   Going to [employer] 2/week at hospital .  Masked,  7 person max.   Has an I pad     Outpatient Medications Prior  to Visit  Medication Sig Dispense Refill  . atorvastatin (LIPITOR) 20 MG tablet Take 1 tablet (20 mg total) by mouth daily. 90 tablet 3  . cetirizine (ZYRTEC) 10 MG tablet Take 10 mg by mouth daily.    . Cholecalciferol (VITAMIN D3) 25 MCG (1000 UT) CAPS Take 1,000 Units by mouth 2 (two) times daily.    Marland Kitchen levothyroxine (SYNTHROID) 25 MCG tablet TAKE 1 TABLET BY MOUTH DAILY BEFORE BREAKFAST EXCEPT ON SUNDAY AND THURSDAY TAKE 2 TABS 114 tablet 1  . Probiotic Product (TRUBIOTICS PO) Take by mouth daily.    . sertraline (ZOLOFT) 50 MG tablet TAKE 1 TABLET BY MOUTH EVERY DAY 90 tablet 2  . Turmeric Curcumin 500 MG CAPS Take by mouth daily.     No facility-administered medications prior to visit.    Review of Systems;  Patient denies headache, fevers, malaise, unintentional weight loss, skin rash, eye pain, sinus congestion and sinus pain, sore throat, dysphagia,  hemoptysis , cough, dyspnea, wheezing, chest pain, palpitations, orthopnea, edema, abdominal pain, nausea, melena, diarrhea, constipation, flank pain, dysuria, hematuria, urinary  Frequency, nocturia, numbness, tingling, seizures,  Focal weakness, Loss of consciousness,  Tremor, insomnia, depression, anxiety, and suicidal ideation.      Objective:  BP (!) 142/84 (BP Location: Left Arm, Patient Position: Sitting, Cuff Size: Normal)   Pulse 65   Temp 97.9 F (36.6 C) (Oral)   Resp 16   Ht 5\' 1"  (1.549 m)   Wt 163 lb 6.4 oz (74.1  kg)   SpO2 97%   BMI 30.87 kg/m   BP Readings from Last 3 Encounters:  03/21/20 (!) 142/84  02/17/20 132/86  12/23/19 131/74    Wt Readings from Last 3 Encounters:  03/21/20 163 lb 6.4 oz (74.1 kg)  02/17/20 161 lb 12.8 oz (73.4 kg)  12/23/19 162 lb (73.5 kg)    General appearance: alert, cooperative and appears stated age Ears: normal TM's and external ear canals both ears Throat: lips, mucosa, and tongue normal; teeth and gums normal Neck: no adenopathy, no carotid bruit, supple, symmetrical,  trachea midline and thyroid not enlarged, symmetric, no tenderness/mass/nodules Back: symmetric, no curvature. ROM normal. No CVA tenderness. Lungs: clear to auscultation bilaterally Heart: regular rate and rhythm, S1, S2 normal, no murmur, click, rub or gallop Abdomen: soft, non-tender; bowel sounds normal; no masses,  no organomegaly Pulses: 2+ and symmetric Skin: Skin color, texture, turgor normal. No rashes or lesions Lymph nodes: Cervical, supraclavicular, and axillary nodes normal.  Lab Results  Component Value Date   HGBA1C 5.8 02/17/2020   HGBA1C 5.6 04/29/2019   HGBA1C 5.7 12/06/2017    Lab Results  Component Value Date   CREATININE 1.10 02/24/2020   CREATININE 1.10 02/17/2020   CREATININE 1.05 08/14/2019    Lab Results  Component Value Date   WBC 5.8 09/05/2018   HGB 12.5 09/05/2018   HCT 37.0 09/05/2018   PLT 291.0 09/05/2018   GLUCOSE 93 02/24/2020   CHOL 184 02/17/2020   TRIG 85.0 02/17/2020   HDL 71.20 02/17/2020   LDLDIRECT 91.0 09/09/2015   LDLCALC 96 02/17/2020   ALT 15 02/17/2020   AST 26 02/17/2020   NA 135 02/24/2020   K 4.0 02/24/2020   CL 99 02/24/2020   CREATININE 1.10 02/24/2020   BUN 13 02/24/2020   CO2 28 02/24/2020   TSH 2.69 02/17/2020   INR 1.11 07/31/2018   HGBA1C 5.8 02/17/2020   MICROALBUR <0.7 12/10/2017    DG Bone Density  Result Date: 01/18/2020 EXAM: DUAL X-RAY ABSORPTIOMETRY (DXA) FOR BONE MINERAL DENSITY IMPRESSION: Dear Dr. Darrick Vasquez, Your patient Mary Vasquez completed a FRAX assessment on 01/18/2020 using the [employer] iDXA DXA System (analysis version: 14.10) manufactured by [employer]. The following summarizes the results of our evaluation. PATIENT BIOGRAPHICAL: Name: Mary Vasquez Patient ID: [id number] Birth Date: October 31, 1940 Height:    61.0 in. Gender:     Female    [age]       Weight:    164.6 lbs. Ethnicity:  White                            Exam Date: 01/18/2020 FRAX* RESULTS:  (version: 3.5) 10-year Probability  of Fracture1 Major Osteoporotic Fracture2 Hip Fracture 14.1% 3.8% Population: [place] (Caucasian) Risk Factors: None Based on Femur (Left) Neck BMD 1 -The 10-year probability of fracture may be lower than reported if the patient has received treatment. 2 -Major Osteoporotic Fracture: Clinical Spine, Forearm, Hip or Shoulder *FRAX is a Armed forces logistics/support/administrative officer of the [employer] for Metabolic Bone Disease, a World Science writer (WHO) [employer]. ASSESSMENT: The probability of a major osteoporotic fracture is 14.1% within the next ten years. The probability of a hip fracture is 3.8% within the next ten years. . Your patient Chasey Dull completed a BMD test on 01/18/2020 using the [employer] iDXA DXA System (software version: 14.10) manufactured by [employer]. The following summarizes the results of our evaluation. Technologist: Daine Floras PATIENT BIOGRAPHICAL: Name: Shaquira, Moroz Patient ID: [id number] Birth Date: 1941-04-08 Height: 61.0 in. Gender: Female Exam Date: 01/18/2020 Weight: 164.6 lbs. Indications: Advanced Age, Caucasian, Hypothyroid, Postmenopausal, right hip replacement Fractures: Treatments: Levothyroxine, Vitamin D DENSITOMETRY RESULTS: Site          Region     Measured Date Measured Age WHO Classification Young Adult T-score BMD         %Change vs. Previous Significant Change (*) Left Femur Neck 01/18/2020 79.2 Osteopenia -1.9 0.770 g/cm2 Right Forearm Radius 33% 01/18/2020 78.2 Normal -0.7 0.818 g/cm2 ASSESSMENT: The BMD measured at Femur Neck is 0.770 g/cm2 with a T-score of -1.9. This patient is considered osteopenic according to World Health Organization [facility]) criteria. Lumbar spine was not utilized due to scoliosis. Right femur was excluded due to surgical hardware. The scan quality is good. World Science writer [facility]) criteria for post-menopausal, Caucasian Women: Normal:                   T-score at or above -1 SD Osteopenia/low bone mass: T-score between  -1 and -2.5 SD Osteoporosis:             T-score at or below -2.5 SD RECOMMENDATIONS: 1. All patients should optimize calcium and vitamin D intake. 2. Consider FDA-approved medical therapies in postmenopausal women and men aged 49 years and older, based on the following: a. A hip or vertebral(clinical or morphometric) fracture b. T-score < -2.5 at the femoral neck or spine after appropriate evaluation to exclude secondary causes c. Low bone mass (T-score between -1.0 and -2.5 at the femoral neck or spine) and a 10-year probability of a hip fracture > 3% or a 10-year probability of a major osteoporosis-related fracture > 20% based on the [place]-adapted WHO algorithm 3. Clinician judgment and/or patient preferences may indicate treatment for people with 10-year fracture probabilities above or below these levels FOLLOW-UP: People with diagnosed cases of osteoporosis or at high risk for fracture should have regular bone mineral density tests. For patients eligible for Medicare, routine testing is allowed once every 2 years. The testing frequency can be increased to one year for patients who have rapidly progressing disease, those who are receiving or discontinuing medical therapy to restore bone mass, or have additional risk factors. I have reviewed this report, and agree with the above findings. [facility] Electronically Signed   By: Bretta Bang III M.D.   On: 01/18/2020 13:52    Assessment & Plan:   Problem List Items Addressed This Visit      Unprioritized   Hyperlipidemia with target LDL less than 100    Improved with atorvastatin .  LFTs are normal.  Lab Results  Component Value Date   CHOL 184 02/17/2020   HDL 71.20 02/17/2020   LDLCALC 96 02/17/2020   LDLDIRECT 91.0 09/09/2015   TRIG 85.0 02/17/2020   CHOLHDL 3 02/17/2020         Overweight (BMI 25.0-29.9)    Weight gain addressed.     Recommended exercise,  Consider adding wellbutirin       CKD (chronic kidney  disease) stage 3, GFR 30-59 ml/min    Stable over the last 1.5 years.  Nephrology appt in early October .      Major depressive disorder, recurrent episode with melancholic features (HCC)    improved with sertraline.  Asked to consider adding wellbutrin for fatigue and weight  gain  I provided  30 minutes of  face-to-face time during this encounter reviewing patient's current problems and past surgeries, labs and imaging studies, providing counseling on the above mentioned problems , and coordination  of care .  I am having Bulah C. Surman start on triamcinolone cream. I am also having her maintain her Vitamin D3, atorvastatin, cetirizine, Turmeric Curcumin, Probiotic Product (TRUBIOTICS PO), levothyroxine, and sertraline.  Meds ordered this encounter  Medications  . triamcinolone cream (KENALOG) 0.1 %    Sig: Apply 1 application topically 2 (two) times daily.    Dispense:  30 g    Refill:  0    There are no discontinued medications.  Follow-up: Return in about 6 months (around 09/18/2020).   Sherlene Shamseresa L Koi Zangara, MD

## 2020-04-13 DIAGNOSIS — R82998 Other abnormal findings in urine: Secondary | ICD-10-CM | POA: Diagnosis not present

## 2020-04-13 DIAGNOSIS — N1831 Chronic kidney disease, stage 3a: Secondary | ICD-10-CM | POA: Diagnosis not present

## 2020-04-13 DIAGNOSIS — R350 Frequency of micturition: Secondary | ICD-10-CM | POA: Diagnosis not present

## 2020-04-15 ENCOUNTER — Other Ambulatory Visit: Payer: Self-pay | Admitting: Nephrology

## 2020-04-15 DIAGNOSIS — N1831 Chronic kidney disease, stage 3a: Secondary | ICD-10-CM

## 2020-04-25 ENCOUNTER — Ambulatory Visit
Admission: RE | Admit: 2020-04-25 | Discharge: 2020-04-25 | Disposition: A | Discharge status: Home / Self Care | Payer: PPO | Source: Ambulatory Visit | Attending: Nephrology | Admitting: Nephrology

## 2020-04-25 ENCOUNTER — Other Ambulatory Visit: Payer: Self-pay

## 2020-04-25 DIAGNOSIS — N1831 Chronic kidney disease, stage 3a: Secondary | ICD-10-CM | POA: Insufficient documentation

## 2020-04-25 DIAGNOSIS — N183 Chronic kidney disease, stage 3 unspecified: Secondary | ICD-10-CM | POA: Diagnosis not present

## 2020-04-26 ENCOUNTER — Other Ambulatory Visit: Payer: Self-pay | Admitting: Internal Medicine

## 2020-04-26 DIAGNOSIS — Z1231 Encounter for screening mammogram for malignant neoplasm of breast: Secondary | ICD-10-CM

## 2020-05-15 ENCOUNTER — Other Ambulatory Visit: Payer: Self-pay | Admitting: Internal Medicine

## 2020-06-01 ENCOUNTER — Other Ambulatory Visit: Payer: Self-pay

## 2020-06-01 ENCOUNTER — Ambulatory Visit
Admission: RE | Admit: 2020-06-01 | Discharge: 2020-06-01 | Disposition: A | Discharge status: Home / Self Care | Payer: PPO | Source: Ambulatory Visit | Attending: Internal Medicine | Admitting: Internal Medicine

## 2020-06-01 DIAGNOSIS — Z1231 Encounter for screening mammogram for malignant neoplasm of breast: Secondary | ICD-10-CM | POA: Diagnosis not present

## 2020-06-09 ENCOUNTER — Ambulatory Visit (INDEPENDENT_AMBULATORY_CARE_PROVIDER_SITE_OTHER): Payer: PPO

## 2020-06-09 VITALS — Ht 61.0 in | Wt 156.0 lb

## 2020-06-09 DIAGNOSIS — Z0000 Encounter for general adult medical examination without abnormal findings: Secondary | ICD-10-CM | POA: Diagnosis not present

## 2020-06-09 NOTE — Patient Instructions (Addendum)
Mary Vasquez , Thank you for taking time to come for your Medicare Wellness Visit. I appreciate your ongoing commitment to your health goals. Please review the following plan we discussed and let me know if I can assist you in the future.   These are the goals we discussed: Goals      Patient Stated   .  Weight (lb) < 140 lb (63.5 kg) (pt-stated)      Low cholesterol foods Stay active        This is a list of the screening recommended for you and due dates:  Health Maintenance  Topic Date Due  . Tetanus Vaccine  06/09/2021*  . Mammogram  06/01/2021  . Flu Shot  Completed  . DEXA scan (bone density measurement)  Completed  . COVID-19 Vaccine  Completed  .  Hepatitis C: One time screening is recommended by Center for Disease Control  (CDC) for  adults born from [birth year] through 1964.   Completed  . Pneumonia vaccines  Completed  *Topic was postponed. The date shown is not the original due date.    Immunizations Immunization History  Administered Date(s) Administered  . Fluad Quad(high Dose 78+) 04/15/2019  . Influenza Nasal 04/11/2015  . Influenza, High Dose Seasonal PF 05/28/2016, 06/02/2018, 05/24/2020, 05/24/2020  . Influenza-Unspecified 04/20/2013, 04/16/2014  . PFIZER SARS-COV-2 Vaccination 07/30/2019, 08/20/2019, 05/24/2020  . Pneumococcal Conjugate-13 04/23/2013  . Pneumococcal Polysaccharide-23 04/23/2006, 06/14/2014  . Tdap 07/09/2009   Keep all routine maintenance appointments.   Follow up 09/21/20 @ 9:00  Advanced directives: not yet completed  Conditions/risks identified: none new  Follow up in one year for your annual wellness visit.   Preventive Care 78 Years and Older, Female Preventive care refers to lifestyle choices and visits with your health care provider that can promote health and wellness. What does preventive care include?  A yearly physical exam. This is also called an annual well check.  Dental exams once or twice a year.  Routine eye exams.  Ask your health care provider how often you should have your eyes checked.  Personal lifestyle choices, including:  Daily care of your teeth and gums.  Regular physical activity.  Eating a healthy diet.  Avoiding tobacco and drug use.  Limiting alcohol use.  Practicing safe sex.  Taking low-dose aspirin every day.  Taking vitamin and mineral supplements as recommended by your health care provider. What happens during an annual well check? The services and screenings done by your health care provider during your annual well check will depend on your age, overall health, lifestyle risk factors, and family history of disease. Counseling  Your health care provider may ask you questions about your:  Alcohol use.  Tobacco use.  Drug use.  Emotional well-being.  Home and relationship well-being.  Sexual activity.  Eating habits.  History of falls.  Memory and ability to understand (cognition).  Work and work Astronomer.  Reproductive health. Screening  You may have the following tests or measurements:  Height, weight, and BMI.  Blood pressure.  Lipid and cholesterol levels. These may be checked every 5 years, or more frequently if you are over 50 years old.  Skin check.  Lung cancer screening. You may have this screening every year starting at age 51 if you have a 30-pack-year history of smoking and currently smoke or have quit within the past 15 years.  Fecal occult blood test (FOBT) of the stool. You may have this test every year starting at age 3.  Flexible sigmoidoscopy or colonoscopy. You may have a sigmoidoscopy every 5 years or a colonoscopy every 10 years starting at age 87.  Hepatitis C blood test.  Hepatitis B blood test.  Sexually transmitted disease (STD) testing.  Diabetes screening. This is done by checking your blood sugar (glucose) after you have not eaten for a while (fasting). You may have this done every 1-3 years.  Bone density  scan. This is done to screen for osteoporosis. You may have this done starting at age 70.  Mammogram. This may be done every 1-2 years. Talk to your health care provider about how often you should have regular mammograms. Talk with your health care provider about your test results, treatment options, and if necessary, the need for more tests. Vaccines  Your health care provider may recommend certain vaccines, such as:  Influenza vaccine. This is recommended every year.  Tetanus, diphtheria, and acellular pertussis (Tdap, Td) vaccine. You may need a Td booster every 10 years.  Zoster vaccine. You may need this after age 65.  Pneumococcal 13-valent conjugate (PCV13) vaccine. One dose is recommended after age 57.  Pneumococcal polysaccharide (PPSV23) vaccine. One dose is recommended after age 34. Talk to your health care provider about which screenings and vaccines you need and how often you need them. This information is not intended to replace advice given to you by your health care provider. Make sure you discuss any questions you have with your health care provider. Document Released: 07/22/2015 Document Revised: 03/14/2016 Document Reviewed: 04/26/2015 Elsevier Interactive Patient Education  2017 [employer].  Fall Prevention in the Home Falls can cause injuries. They can happen to people of all ages. There are many things you can do to make your home safe and to help prevent falls. What can I do on the outside of my home?  Regularly fix the edges of walkways and driveways and fix any cracks.  Remove anything that might make you trip as you walk through a door, such as a raised step or threshold.  Trim any bushes or trees on the path to your home.  Use bright outdoor lighting.  Clear any walking paths of anything that might make someone trip, such as rocks or tools.  Regularly check to see if handrails are loose or broken. Make sure that both sides of any steps have  handrails.  Any raised decks and porches should have guardrails on the edges.  Have any leaves, snow, or ice cleared regularly.  Use sand or salt on walking paths during winter.  Clean up any spills in your garage right away. This includes oil or grease spills. What can I do in the bathroom?  Use night lights.  Install grab bars by the toilet and in the tub and shower. Do not use towel bars as grab bars.  Use non-skid mats or decals in the tub or shower.  If you need to sit down in the shower, use a plastic, non-slip stool.  Keep the floor dry. Clean up any water that spills on the floor as soon as it happens.  Remove soap buildup in the tub or shower regularly.  Attach bath mats securely with double-sided non-slip rug tape.  Do not have throw rugs and other things on the floor that can make you trip. What can I do in the bedroom?  Use night lights.  Make sure that you have a light by your bed that is easy to reach.  Do not use any sheets or blankets  that are too big for your bed. They should not hang down onto the floor.  Have a firm chair that has side arms. You can use this for support while you get dressed.  Do not have throw rugs and other things on the floor that can make you trip. What can I do in the kitchen?  Clean up any spills right away.  Avoid walking on wet floors.  Keep items that you use a lot in easy-to-reach places.  If you need to reach something above you, use a strong step stool that has a grab bar.  Keep electrical cords out of the way.  Do not use floor polish or wax that makes floors slippery. If you must use wax, use non-skid floor wax.  Do not have throw rugs and other things on the floor that can make you trip. What can I do with my stairs?  Do not leave any items on the stairs.  Make sure that there are handrails on both sides of the stairs and use them. Fix handrails that are broken or loose. Make sure that handrails are as long as  the stairways.  Check any carpeting to make sure that it is firmly attached to the stairs. Fix any carpet that is loose or worn.  Avoid having throw rugs at the top or bottom of the stairs. If you do have throw rugs, attach them to the floor with carpet tape.  Make sure that you have a light switch at the top of the stairs and the bottom of the stairs. If you do not have them, ask someone to add them for you. What else can I do to help prevent falls?  Wear shoes that:  Do not have high heels.  Have rubber bottoms.  Are comfortable and fit you well.  Are closed at the toe. Do not wear sandals.  If you use a stepladder:  Make sure that it is fully opened. Do not climb a closed stepladder.  Make sure that both sides of the stepladder are locked into place.  Ask someone to hold it for you, if possible.  Clearly mark and make sure that you can see:  Any grab bars or handrails.  First and last steps.  Where the edge of each step is.  Use tools that help you move around (mobility aids) if they are needed. These include:  Canes.  Walkers.  Scooters.  Crutches.  Turn on the lights when you go into a dark area. Replace any light bulbs as soon as they burn out.  Set up your furniture so you have a clear path. Avoid moving your furniture around.  If any of your floors are uneven, fix them.  If there are any pets around you, be aware of where they are.  Review your medicines with your doctor. Some medicines can make you feel dizzy. This can increase your chance of falling. Ask your doctor what other things that you can do to help prevent falls. This information is not intended to replace advice given to you by your health care provider. Make sure you discuss any questions you have with your health care provider. Document Released: 04/21/2009 Document Revised: 12/01/2015 Document Reviewed: 07/30/2014 Elsevier Interactive Patient Education  2017 [employer].

## 2020-06-09 NOTE — Progress Notes (Addendum)
Subjective:   Mary Vasquez is a 79 y.o. female who presents for Medicare Annual (Subsequent) preventive examination.  Review of Systems    No ROS.  Medicare Wellness Virtual Visit.  Cardiac Risk Factors include: hypertension     Objective:    Today's Vitals   06/09/20 1033  Weight: 156 lb (70.8 kg)  Height: 5\' 1"  (1.549 m)   Body mass index is 29.48 kg/m.  Advanced Directives 06/09/2020 12/23/2019 11/25/2019 06/09/2019 01/24/2019 08/13/2018 07/31/2018  Does Patient Have a Medical Advance Directive? No No No Yes No No No  Type of Advance Directive - - - Healthcare Power of Attorney - - -  Does patient want to make changes to medical advance directive? - - - No - Patient declined - - -  Copy of Healthcare Power of Attorney in Chart? - - - No - copy requested - - -  Would patient like information on creating a medical advance directive? No - Patient declined No - Patient declined No - Patient declined - No - Patient declined Yes (MAU/Ambulatory/Procedural Areas - Information given) Yes (MAU/Ambulatory/Procedural Areas - Information given)    Current Medications (verified) Outpatient Encounter Medications as of 06/09/2020  Medication Sig   atorvastatin (LIPITOR) 20 MG tablet TAKE 1 TABLET BY MOUTH EVERY DAY   cetirizine (ZYRTEC) 10 MG tablet Take 10 mg by mouth daily.   Cholecalciferol (VITAMIN D3) 25 MCG (1000 UT) CAPS Take 1,000 Units by mouth 2 (two) times daily.   levothyroxine (SYNTHROID) 25 MCG tablet TAKE 1 TABLET BY MOUTH DAILY BEFORE BREAKFAST EXCEPT ON SUNDAY AND THURSDAY TAKE 2 TABS   Probiotic Product (TRUBIOTICS PO) Take by mouth daily.   sertraline (ZOLOFT) 50 MG tablet TAKE 1 TABLET BY MOUTH EVERY DAY   triamcinolone cream (KENALOG) 0.1 % Apply 1 application topically 2 (two) times daily.   Turmeric Curcumin 500 MG CAPS Take by mouth daily.   No facility-administered encounter medications on file as of 06/09/2020.    Allergies (verified) Hydrocodone, Penicillin g,  Lexapro [escitalopram], Wellbutrin [bupropion], and Augmentin [amoxicillin-pot clavulanate]   History: Past Medical History:  Diagnosis Date   Arthritis    Depression    situational   Family history of adverse reaction to anesthesia    Mother - altered mental state for a day or 2(age 33)   History of kidney stones    Hyperlipidemia    Hypertension    Hypothyroidism    Vertigo    none for several years   Past Surgical History:  Procedure Laterality Date   BREAST BIOPSY Left 2009   CORE - NEG...marker in breast   BREAST SURGERY  2009   stereotactic,  left,  benign    CATARACT EXTRACTION W/PHACO Left 11/25/2019   Procedure: CATARACT EXTRACTION PHACO AND INTRAOCULAR LENS PLACEMENT (IOC) LEFT 9.58 01:13.8 12.9%;  Surgeon: 11/27/2019, MD;  Location[facility];  Service: Ophthalmology;  Laterality: Left;   CATARACT EXTRACTION W/PHACO Right 12/23/2019   Procedure: CATARACT EXTRACTION PHACO AND INTRAOCULAR LENS PLACEMENT (IOC) RIGHT 12.74  01:08.4  18.6%;  Surgeon: 12/25/2019, MD;  Location: [facility];  Service: Ophthalmology;  Laterality: Right;   HYSTEROSCOPY  2008   ROTATOR CUFF REPAIR Right 2009   [facility] Armour   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   Procedure: TOTAL HIP ARTHROPLASTY ANTERIOR APPROACH;  Surgeon: 10/12/2018, MD;  Location: [facility];  Service: Orthopedics;  Laterality: Right;   Family History  Problem Relation Age of Onset  Heart disease Mother [id number]       ami   Heart disease Father        ami   Diabetes Brother    Cancer Brother        prostate ca   Breast cancer Paternal Aunt        50'S   Breast cancer Cousin    Social History   Socioeconomic History   Marital status: Divorced    Spouse name: Not on file   Number of children: Not on file   Years of education: Not on file   Highest education level: Not on file  Occupational History   Occupation: receptionist    Comment: retired  Tobacco Use   Smoking status:  Never Smoker   Smokeless tobacco: Never Used  Building services engineerVaping Use   Vaping Use: Never used  Substance and Sexual Activity   Alcohol use: Yes    Alcohol/week: 3.0 standard drinks    Types: 3 Glasses of wine per week   Drug use: No   Sexual activity: Not Currently  Other Topics Concern   Not on file  Social History Narrative   Not on file   Social Determinants of Health   Financial Resource Strain: Low Risk    Difficulty of Paying Living Expenses: Not hard at all  Food Insecurity: No Food Insecurity   Worried About Programme researcher, broadcasting/film/videounning Out of Food in the Last Year: Never true   Ran Out of Food in the Last Year: Never true  Transportation Needs: No Transportation Needs   Lack of Transportation (Medical): No   Lack of Transportation (Non-Medical): No  Physical Activity: Insufficiently Active   Days of Exercise per Week: 2 days   Minutes of Exercise per Session: 50 min  Stress: No Stress Concern Present   Feeling of Stress : Not at all  Social Connections: Unknown   Frequency of Communication with Friends and Family: More than three times a week   Frequency of Social Gatherings with Friends and Family: Twice a week   Attends Religious Services: Not on Scientist, clinical (histocompatibility and immunogenetics)file   Active Member of Clubs or Organizations: Not on file   Attends BankerClub or Organization Meetings: Not on file   Marital Status: Not on file    Tobacco Counseling Counseling given: Not Answered   Clinical Intake:  Pre-visit preparation completed: Yes        Diabetes: No  How often do you need to have someone help you when you read instructions, pamphlets, or other written materials from your doctor or pharmacy?: 1 - Never   Interpreter Needed?: No      Activities of Daily Living In your present state of health, do you have any difficulty performing the following activities: 06/09/2020 12/23/2019  Hearing? N N  Vision? N N  Difficulty concentrating or making decisions? N N  Walking or climbing stairs? N N  Dressing or bathing? N N   Doing errands, shopping? N -  Preparing Food and eating ? N -  Using the Toilet? N -  In the past six months, have you accidently leaked urine? N -  Do you have problems with loss of bowel control? N -  Managing your Medications? N -  Managing your Finances? N -  Housekeeping or managing your Housekeeping? N -  Some recent data might be hidden    Patient Care Team: Sherlene Shamsullo, Teresa L, MD as PCP - General (Internal Medicine)  Indicate any recent Medical Services you may have received from other than  [facility] providers in the past year (date may be approximate).     Assessment:   This is a routine wellness examination for Berneita.  I connected with Mikiya today by telephone and verified that I am speaking with the correct person using two identifiers. Location patient: home Location provider: work Persons participating in the virtual visit: patient, Engineer, civil (consulting).    I discussed the limitations, risks, security and privacy concerns of performing an evaluation and management service by telephone and the availability of in person appointments. The patient expressed understanding and verbally consented to this telephonic visit.    Interactive audio and video telecommunications were attempted between this provider and patient, however failed, due to patient having technical difficulties OR patient did not have access to video capability.  We continued and completed visit with audio only.  Some vital signs may be absent or patient reported.   Hearing/Vision screen  Hearing Screening   125Hz  250Hz  500Hz  1000Hz  2000Hz  3000Hz  4000Hz  6000Hz  8000Hz   Right ear:           Left ear:           Comments: Patient is able to hear conversational tones without difficulty. No issues reported.  Vision Screening Comments: Wears corrective lenses  Visual acuity not assessed, virtual visit. They have seen their ophthalmologist in the last 12 months.  Dietary issues and exercise activities discussed: Current  Exercise Habits: Home exercise routine, Type of exercise: calisthenics (Silver Sneaker), Time (Minutes): 45, Frequency (Times/Week): 2, Weekly Exercise (Minutes/Week): 90, Intensity: Mild  Intermittent fasting  Good water intake  Goals       Patient Stated     Weight (lb) < 140 lb (63.5 kg) (pt-stated)      Low cholesterol foods Stay active        Depression Screen PHQ 2/9 Scores 06/09/2020 03/21/2020 06/09/2019 12/10/2017 12/06/2016 11/28/2015 04/23/2013  PHQ - 2 Score 1 2 1  0 3 0 0  PHQ- 9 Score - 9 - 2 8 - -    Fall Risk Fall Risk  06/09/2020 03/21/2020 02/17/2020 09/15/2019 06/09/2019  Falls in the past year? 0 0 0 0 0  Comment - - - - -  Number falls in past yr: 0 - - - -  Injury with Fall? 0 - - - -  Follow up Falls evaluation completed Falls evaluation completed Falls evaluation completed Falls evaluation completed Education provided;Falls prevention discussed   Handrails in use when climbing stairs? Yes Home free of loose throw rugs in walkways, pet beds, electrical cords, etc? Yes  Adequate lighting in your home to reduce risk of falls? Yes   ASSISTIVE DEVICES UTILIZED TO PREVENT FALLS: Life alert? No  Use of a cane, walker or w/c? No   TIMED UP AND GO: Was the test performed? No . Visits virtual.   Cognitive Function: MMSE - Mini Mental State Exam 11/28/2015  Orientation to time 5  Orientation to Place 5  Registration 3  Attention/ Calculation 5  Recall 3  Language- name 2 objects 2  Language- repeat 1  Language- follow 3 step command 3  Language- read & follow direction 1  Write a sentence 1  Copy design 1  Total score 30     6CIT Screen 06/09/2020 06/09/2019  What Year? 0 points 0 points  What month? 0 points 0 points  What time? 0 points 0 points  Count back from 20 0 points 0 points  Months in reverse 0 points 0 points  Repeat phrase -  0 points  Total Score - 0    Immunizations Immunization History  Administered Date(s) Administered   Fluad Quad(high  Dose 65+) 04/15/2019   Influenza Nasal 04/11/2015   Influenza, High Dose Seasonal PF 05/28/2016, 06/02/2018, 05/24/2020, 05/24/2020   Influenza-Unspecified 04/20/2013, 04/16/2014   PFIZER SARS-COV-2 Vaccination 07/30/2019, 08/20/2019, 05/24/2020   Pneumococcal Conjugate-13 04/23/2013   Pneumococcal Polysaccharide-23 04/23/2006, 06/14/2014   Tdap 07/09/2009    TDAP status: Due, Education has been provided regarding the importance of this vaccine. Advised may receive this vaccine at local pharmacy or Health Dept. Aware to provide a copy of the vaccination record if obtained from local pharmacy or Health Dept. Verbalized acceptance and understanding. Deferred.    Health Maintenance Health Maintenance  Topic Date Due   TETANUS/TDAP  06/09/2021 (Originally 07/10/2019)   MAMMOGRAM  06/01/2021   INFLUENZA VACCINE  Completed   DEXA SCAN  Completed   COVID-19 Vaccine  Completed   Hepatitis C Screening  Completed   PNA vac Low Risk Adult  Completed   Colorectal cancer screening: No longer required.    Cologuard- Negative 2017. Repeat every 3 years. Prefers to wait for follow up with PCP in March.   Mammogram status: Completed 06/01/20. Repeat every year   Bone Density- 01/18/20.   Lung Cancer Screening: (Low Dose CT Chest recommended if Age 59-80 years, 30 pack-year currently smoking OR have quit w/in 15years.) does not qualify.   Hepatitis C Screening: Completed 09/09/15. Vision Screening: Recommended annual ophthalmology exams for early detection of glaucoma and other disorders of the eye. Is the patient up to date with their annual eye exam?  Yes  Who is the provider or what is the name of the office in which the patient attends annual eye exams? [facility], Dr. Inez Pilgrim.   Dental Screening: Recommended annual dental exams for proper oral hygiene. Plans to schedule.   Community Resource Referral / Chronic Care Management: CRR required this visit?  No   CCM required this  visit?  No      Plan:   Keep all routine maintenance appointments.   Follow up 09/21/20 @ 9:00  I have personally reviewed and noted the following in the patient's chart:   Medical and social history Use of alcohol, tobacco or illicit drugs  Current medications and supplements Functional ability and status Nutritional status Physical activity Advanced directives List of other physicians Hospitalizations, surgeries, and ER visits in previous 12 months Vitals Screenings to include cognitive, depression, and falls Referrals and appointments  In addition, I have reviewed and discussed with patient certain preventive protocols, quality metrics, and best practice recommendations. A written personalized care plan for preventive services as well as general preventive health recommendations were provided to patient my mail.     OBrien-Blaney, Charniece Venturino L, LPN   12/09/6946      I have reviewed the above information and agree with above.   Duncan Dull, MD

## 2020-06-16 ENCOUNTER — Other Ambulatory Visit: Payer: Self-pay | Admitting: Internal Medicine

## 2020-08-04 DIAGNOSIS — H04123 Dry eye syndrome of bilateral lacrimal glands: Secondary | ICD-10-CM | POA: Diagnosis not present

## 2020-09-21 ENCOUNTER — Ambulatory Visit (INDEPENDENT_AMBULATORY_CARE_PROVIDER_SITE_OTHER): Payer: PPO | Admitting: Internal Medicine

## 2020-09-21 ENCOUNTER — Other Ambulatory Visit: Payer: Self-pay

## 2020-09-21 ENCOUNTER — Encounter: Payer: Self-pay | Admitting: Internal Medicine

## 2020-09-21 VITALS — BP 148/78 | HR 67 | Temp 98.3°F | Resp 15 | Ht 61.0 in | Wt 154.0 lb

## 2020-09-21 DIAGNOSIS — F418 Other specified anxiety disorders: Secondary | ICD-10-CM

## 2020-09-21 DIAGNOSIS — E034 Atrophy of thyroid (acquired): Secondary | ICD-10-CM | POA: Diagnosis not present

## 2020-09-21 DIAGNOSIS — E785 Hyperlipidemia, unspecified: Secondary | ICD-10-CM | POA: Diagnosis not present

## 2020-09-21 DIAGNOSIS — I10 Essential (primary) hypertension: Secondary | ICD-10-CM

## 2020-09-21 DIAGNOSIS — F5105 Insomnia due to other mental disorder: Secondary | ICD-10-CM

## 2020-09-21 DIAGNOSIS — N1831 Chronic kidney disease, stage 3a: Secondary | ICD-10-CM

## 2020-09-21 DIAGNOSIS — R7303 Prediabetes: Secondary | ICD-10-CM | POA: Diagnosis not present

## 2020-09-21 LAB — TSH: TSH: 2.23 u[IU]/mL (ref 0.35–4.50)

## 2020-09-21 LAB — COMPREHENSIVE METABOLIC PANEL
ALT: 17 U/L (ref 0–35)
AST: 29 U/L (ref 0–37)
Albumin: 4.2 g/dL (ref 3.5–5.2)
Alkaline Phosphatase: 64 U/L (ref 39–117)
BUN: 11 mg/dL (ref 6–23)
CO2: 28 mEq/L (ref 19–32)
Calcium: 9.8 mg/dL (ref 8.4–10.5)
Chloride: 102 mEq/L (ref 96–112)
Creatinine, Ser: 1.03 mg/dL (ref 0.40–1.20)
GFR: 51.93 mL/min — ABNORMAL LOW (ref >60.00)
Glucose, Bld: 96 mg/dL (ref 70–99)
Potassium: 4.5 mEq/L (ref 3.5–5.1)
Sodium: 138 mEq/L (ref 135–145)
Total Bilirubin: 0.8 mg/dL (ref 0.2–1.2)
Total Protein: 7.3 g/dL (ref 6.0–8.3)

## 2020-09-21 LAB — LIPID PANEL
Cholesterol: 191 mg/dL (ref 0–200)
HDL: 78.5 mg/dL (ref >39.00)
LDL Cholesterol: 96 mg/dL (ref 0–99)
NonHDL: 112.68
Total CHOL/HDL Ratio: 2
Triglycerides: 85 mg/dL (ref 0.0–149.0)
VLDL: 17 mg/dL (ref 0.0–40.0)

## 2020-09-21 LAB — HEMOGLOBIN A1C: Hgb A1c MFr Bld: 5.9 % (ref 4.6–6.5)

## 2020-09-21 LAB — MICROALBUMIN / CREATININE URINE RATIO
Creatinine,U: 157.1 mg/dL
Microalb Creat Ratio: 0.5 mg/g (ref 0.0–30.0)
Microalb, Ur: 0.8 mg/dL (ref 0.0–1.9)

## 2020-09-21 LAB — VITAMIN D 25 HYDROXY (VIT D DEFICIENCY, FRACTURES): VITD: 39.81 ng/mL (ref 30.00–100.00)

## 2020-09-21 MED ORDER — ZOSTER VAC RECOMB ADJUVANTED 50 MCG/0.5ML IM SUSR
0.5000 mL | Freq: Once | INTRAMUSCULAR | 1 refills | Status: AC
Start: 2020-09-21 — End: 2020-09-21

## 2020-09-21 MED ORDER — TETANUS-DIPHTH-ACELL PERTUSSIS 5-2.5-18.5 LF-MCG/0.5 IM SUSY
0.5000 mL | PREFILLED_SYRINGE | Freq: Once | INTRAMUSCULAR | 0 refills | Status: AC
Start: 2020-09-21 — End: 2020-09-21

## 2020-09-21 NOTE — Patient Instructions (Addendum)
Try taking tylenol 1000 mg at bedtime   (You can take a total of 2000 mg of acetominophen (tylenol) every day safely  In divided doses (500 mg every 6 hours  Or 1000 mg every 12 hours.)   If the support group does not improve your outlook in a few more weeks. Let me know so we can increase zoloft to 1.5 (75 mg )     The goal for patients with kidney disease is a  blood pressure less than 130/80 .  Your blood pressure was 150/90 in your left arm and 135/80 in the right. This may be a sign of  abnormal blood flow  In your subclavian artery,  So we need more data  please schedule an RN visit and bring your bp machine.

## 2020-09-21 NOTE — Progress Notes (Signed)
Patient ID: Mary Vasquez, female    DOB: 09-11-1940  Age: 80 y.o. MRN: [id number]  The patient is here for FOLLOW UP AND management of other chronic and problems.   The risk factors are reflected in the social history.  The roster of all physicians providing medical care to patient - is listed in the Snapshot section of the chart.  Activities of daily living:  The patient is 100% independent in all ADLs: dressing, toileting, feeding as well as independent mobility  Home safety : The patient has smoke detectors in the home. They wear seatbelts.  There are no firearms at home. There is no violence in the home.   There is no risks for hepatitis, STDs or HIV. There is no   history of blood transfusion. They have no travel history to infectious disease endemic areas of the world.  The patient has seen their dentist in the last six month. They have seen their eye doctor in the last year. They admit to slight hearing difficulty with regard to whispered voices and some television programs.  They have deferred audiologic testing in the last year.  They do not  have excessive sun exposure. Discussed the need for sun protection: hats, long sleeves and use of sunscreen if there is significant sun exposure.   Diet: the importance of a healthy diet is discussed. They do have a healthy diet.  The benefits of regular aerobic exercise were discussed. She walks 4 times per week ,  20 minutes.   Depression screen: there are no signs or vegative symptoms of depression- irritability, change in appetite, anhedonia, sadness/tearfullness.  Cognitive assessment: the patient manages all their financial and personal affairs and is actively engaged. They could relate day,date,year and events; recalled 2/3 objects at 3 minutes; performed clock-face test normally.  The following portions of the patient's history were reviewed and updated as appropriate: allergies, current medications, past family history, past medical  history,  past surgical history, past social history  and problem list.  Visual acuity was not assessed per patient preference since she has regular follow up with her ophthalmologist. Hearing and body mass index were assessed and reviewed.   During the course of the visit the patient was educated and counseled about appropriate screening and preventive services including : fall prevention , diabetes screening, nutrition counseling, colorectal cancer screening, and recommended immunizations.    CC: The primary encounter diagnosis was Stage 3a chronic kidney disease (HCC). Diagnoses of Hypothyroidism due to acquired atrophy of thyroid, Hyperlipidemia with target LDL less than 100, Elevated blood pressure reading in office with diagnosis of hypertension, Prediabetes, and Insomnia secondary to depression with anxiety were also pertinent to this visit.   BP by me:  150/92 in left,   130/80 in right  Intentional weight loss through dietary changes and participation in silver sneakers 2 / week   Grief: dtr died one year ago  .  Attending a grief support group at [facility].    No Trouble falling asleep.  Vivid dreams,  Using melatonin at  8pm  .  Frequent wakeups  Not bladder.   History Mary Vasquez has a past medical history of Arthritis, Depression, Family history of adverse reaction to anesthesia, History of kidney stones, Hyperlipidemia, Hypertension, Hypothyroidism, and Vertigo.   She has a past surgical history that includes Hysteroscopy (2008); Rotator cuff repair (Right, 2009); Breast surgery (2009); Breast biopsy (Left, 2009); Total hip arthroplasty (Right, 08/13/2018); Cataract extraction w/PHACO (Left, 11/25/2019); and Cataract extraction w/PHACO (  Right, 12/23/2019).   Her family history includes Breast cancer in her cousin and paternal aunt; Cancer in her brother; Diabetes in her brother; Heart disease in her father; Heart disease (age of onset: 10) in her mother.She reports that she has never  smoked. She has never used smokeless tobacco. She reports current alcohol use of about 3.0 standard drinks of alcohol per week. She reports that she does not use drugs.  Outpatient Medications Prior to Visit  Medication Sig Dispense Refill   atorvastatin (LIPITOR) 20 MG tablet TAKE 1 TABLET BY MOUTH EVERY DAY 90 tablet 3   cetirizine (ZYRTEC) 10 MG tablet Take 10 mg by mouth daily.     Cholecalciferol (VITAMIN D3) 25 MCG (1000 UT) CAPS Take 1,000 Units by mouth 2 (two) times daily.     levothyroxine (SYNTHROID) 25 MCG tablet TAKE 1 TABLET BY MOUTH DAILY BEFORE BREAKFAST EXCEPT ON SUNDAY AND THURSDAY TAKE 2 TABS 114 tablet 1   Probiotic Product (TRUBIOTICS PO) Take by mouth daily.     sertraline (ZOLOFT) 50 MG tablet TAKE 1 TABLET BY MOUTH EVERY DAY 90 tablet 2   Turmeric Curcumin 500 MG CAPS Take by mouth daily.     triamcinolone cream (KENALOG) 0.1 % Apply 1 application topically 2 (two) times daily. (Patient not taking: Reported on 09/21/2020) 30 g 0   No facility-administered medications prior to visit.    Review of Systems   Patient denies headache, fevers, malaise, unintentional weight loss, skin rash, eye pain, sinus congestion and sinus pain, sore throat, dysphagia,  hemoptysis , cough, dyspnea, wheezing, chest pain, palpitations, orthopnea, edema, abdominal pain, nausea, melena, diarrhea, constipation, flank pain, dysuria, hematuria, urinary  Frequency, nocturia, numbness, tingling, seizures,  Focal weakness, Loss of consciousness,  Tremor, insomnia, depression, anxiety, and suicidal ideation.      Objective:  BP (!) 148/78 (BP Location: Left Arm, Patient Position: Sitting, Cuff Size: Normal)    Pulse 67    Temp 98.3 F (36.8 C) (Oral)    Resp 15    Ht 5\' 1"  (1.549 m)    Wt 154 lb (69.9 kg)    SpO2 96%    BMI 29.10 kg/m   Physical Exam  General appearance: alert, cooperative and appears stated age Head: Normocephalic, without obvious abnormality, atraumatic Eyes:  conjunctivae/corneas clear. PERRL, EOM's intact. Fundi benign. Ears: normal TM's and external ear canals both ears Nose: Nares normal. Septum midline. Mucosa normal. No drainage or sinus tenderness. Throat: lips, mucosa, and tongue normal; teeth and gums normal Neck: no adenopathy, no carotid bruit, no JVD, supple, symmetrical, trachea midline and thyroid not enlarged, symmetric, no tenderness/mass/nodules Lungs: clear to auscultation bilaterally Breasts: normal appearance, no masses or tenderness Heart: regular rate and rhythm, S1, S2 normal, no murmur, click, rub or gallop Abdomen: soft, non-tender; bowel sounds normal; no masses,  no organomegaly Extremities: extremities normal, atraumatic, no cyanosis or edema Pulses: 2+ and symmetric Skin: Skin color, texture, turgor normal. No rashes or lesions Neurologic: Alert and oriented X 3, normal strength and tone. Normal symmetric reflexes. Normal coordination and gait.    Assessment & Plan:   Problem List Items Addressed This Visit      Unprioritized   CKD (chronic kidney disease) stage 3, GFR 30-59 ml/min (HCC) - Primary    Stable over the last 1.5 years.  Vitamin D is normal as are lytes.  She ns no proteinuria Continue to avoid regular use of NSAIDs and continue  statin for control of hyperlipidemia.  Lab Results  Component Value Date   CREATININE 1.03 09/21/2020   Lab Results  Component Value Date   NA 138 09/21/2020   K 4.5 09/21/2020   CL 102 09/21/2020   CO2 28 09/21/2020         Relevant Orders   Comprehensive metabolic panel (Completed)   VITAMIN D 25 Hydroxy (Vit-D Deficiency, Fractures) (Completed)   Hyperlipidemia with target LDL less than 100    Improved with atorvastatin 20 mg dose  LDL is at goal .  LFTs are normal.  Lab Results  Component Value Date   CHOL 191 09/21/2020   HDL 78.50 09/21/2020   LDLCALC 96 09/21/2020   LDLDIRECT 91.0 09/09/2015   TRIG 85.0 09/21/2020   CHOLHDL 2 09/21/2020          Relevant Orders   Lipid panel (Completed)   Hypothyroidism    Thyroid function  Is normal on 25 mcg daily of levothyroxine.   Lab Results  Component Value Date   TSH 2.23 09/21/2020         Relevant Orders   TSH (Completed)   Insomnia secondary to depression with anxiety    Recommended adding melatonin at dinner time        Other Visit Diagnoses    Elevated blood pressure reading in office with diagnosis of hypertension       Relevant Orders   Microalbumin / creatinine urine ratio (Completed)   Prediabetes       Relevant Orders   Hemoglobin A1c (Completed)     A total of 40 minutes was spent with patient more than half of which was spent in counseling patient on the above mentioned issues , reviewing and explaining recent labs and imaging studies done, and coordination of care. I have discontinued Mary Vasquez's triamcinolone. I am also having her start on Zoster Vaccine Adjuvanted and Tdap. Additionally, I am having her maintain her Vitamin D3, cetirizine, Turmeric Curcumin, Probiotic Product (TRUBIOTICS PO), sertraline, atorvastatin, and levothyroxine.  Meds ordered this encounter  Medications   Zoster Vaccine Adjuvanted [facility]) injection    Sig: Inject 0.5 mLs into the muscle once for 1 dose.    Dispense:  1 each    Refill:  1   Tdap (BOOSTRIX) 5-2.5-18.5 LF-MCG/0.5 injection    Sig: Inject 0.5 mLs into the muscle once for 1 dose.    Dispense:  0.5 mL    Refill:  0    Medications Discontinued During This Encounter  Medication Reason   triamcinolone cream (KENALOG) 0.1 %     Follow-up: Return in about 6 months (around 03/24/2021).   Sherlene Shams, MD

## 2020-09-23 ENCOUNTER — Other Ambulatory Visit: Payer: Self-pay

## 2020-09-23 ENCOUNTER — Ambulatory Visit (INDEPENDENT_AMBULATORY_CARE_PROVIDER_SITE_OTHER): Payer: PPO | Admitting: *Deleted

## 2020-09-23 VITALS — BP 132/84 | HR 56 | Resp 16

## 2020-09-23 DIAGNOSIS — I10 Essential (primary) hypertension: Secondary | ICD-10-CM

## 2020-09-23 NOTE — Progress Notes (Signed)
Patient here for nurse visit BP check per order from 09/21/20. Patient in for Comparison BP Check forleft greater than right on 09/21/20.  Patient reports compliance with prescribed BP medications: NA  Last dose of BP medication: NA  BP Readings from Last 3 Encounters:  09/23/20 132/84  09/21/20 (!) 148/78  03/21/20 (!) 142/84   Pulse Readings from Last 3 Encounters:  09/23/20 (!) 56  09/21/20 67  03/21/20 65   Initial check BP Left arm BP 140/74   Pulse 59                           Right  138/74 pulse 61 Waited 10 minutes Patient used home cuff on left arm BP 165/89 pulse  65. Ask patient last time battery changed she could not remember  But batteries were weak.  Waited 15 minutes took BP. Left arm 154/84 pulse 56 Right arm 132/84 pulse 56  Waited an additional 15 minutes  Left arm 148/84 pulse 59 Right arm 130/84 pulse 59  Patient verbalized understanding of instructions.   Henrene Pastor, RN

## 2020-09-24 ENCOUNTER — Encounter: Payer: Self-pay | Admitting: Internal Medicine

## 2020-09-24 NOTE — Assessment & Plan Note (Signed)
Thyroid function  Is normal on 25 mcg daily of levothyroxine.   Lab Results  Component Value Date   TSH 2.23 09/21/2020

## 2020-09-24 NOTE — Assessment & Plan Note (Signed)
Improved with atorvastatin 20 mg dose  LDL is at goal .  LFTs are normal.  Lab Results  Component Value Date   CHOL 191 09/21/2020   HDL 78.50 09/21/2020   LDLCALC 96 09/21/2020   LDLDIRECT 91.0 09/09/2015   TRIG 85.0 09/21/2020   CHOLHDL 2 09/21/2020

## 2020-09-24 NOTE — Assessment & Plan Note (Signed)
Recommended adding melatonin at dinner time

## 2020-09-24 NOTE — Assessment & Plan Note (Addendum)
Stable over the last 1.5 years.  Vitamin D is normal as are lytes.  She ns no proteinuria Continue to avoid regular use of NSAIDs and continue  statin for control of hyperlipidemia.   Lab Results  Component Value Date   CREATININE 1.03 09/21/2020   Lab Results  Component Value Date   NA 138 09/21/2020   K 4.5 09/21/2020   CL 102 09/21/2020   CO2 28 09/21/2020

## 2020-12-15 ENCOUNTER — Other Ambulatory Visit: Payer: Self-pay | Admitting: Internal Medicine

## 2021-01-06 ENCOUNTER — Other Ambulatory Visit: Payer: Self-pay | Admitting: Internal Medicine

## 2021-03-29 ENCOUNTER — Ambulatory Visit (INDEPENDENT_AMBULATORY_CARE_PROVIDER_SITE_OTHER): Payer: PPO | Admitting: Internal Medicine

## 2021-03-29 ENCOUNTER — Other Ambulatory Visit: Payer: Self-pay

## 2021-03-29 ENCOUNTER — Encounter: Payer: Self-pay | Admitting: Internal Medicine

## 2021-03-29 VITALS — BP 138/86 | HR 72 | Temp 96.1°F | Ht 61.0 in | Wt 155.8 lb

## 2021-03-29 DIAGNOSIS — E785 Hyperlipidemia, unspecified: Secondary | ICD-10-CM | POA: Diagnosis not present

## 2021-03-29 DIAGNOSIS — E663 Overweight: Secondary | ICD-10-CM | POA: Diagnosis not present

## 2021-03-29 DIAGNOSIS — F339 Major depressive disorder, recurrent, unspecified: Secondary | ICD-10-CM | POA: Diagnosis not present

## 2021-03-29 DIAGNOSIS — E034 Atrophy of thyroid (acquired): Secondary | ICD-10-CM

## 2021-03-29 DIAGNOSIS — N1831 Chronic kidney disease, stage 3a: Secondary | ICD-10-CM

## 2021-03-29 LAB — COMPREHENSIVE METABOLIC PANEL
ALT: 18 U/L (ref 0–35)
AST: 31 U/L (ref 0–37)
Albumin: 4.2 g/dL (ref 3.5–5.2)
Alkaline Phosphatase: 58 U/L (ref 39–117)
BUN: 12 mg/dL (ref 6–23)
CO2: 28 mEq/L (ref 19–32)
Calcium: 9.6 mg/dL (ref 8.4–10.5)
Chloride: 102 mEq/L (ref 96–112)
Creatinine, Ser: 1 mg/dL (ref 0.40–1.20)
GFR: 53.61 mL/min — ABNORMAL LOW (ref >60.00)
Glucose, Bld: 100 mg/dL — ABNORMAL HIGH (ref 70–99)
Potassium: 4.4 mEq/L (ref 3.5–5.1)
Sodium: 138 mEq/L (ref 135–145)
Total Bilirubin: 0.7 mg/dL (ref 0.2–1.2)
Total Protein: 7.3 g/dL (ref 6.0–8.3)

## 2021-03-29 LAB — LIPID PANEL
Cholesterol: 189 mg/dL (ref 0–200)
HDL: 73.5 mg/dL (ref >39.00)
LDL Cholesterol: 97 mg/dL (ref 0–99)
NonHDL: 115.8
Total CHOL/HDL Ratio: 3
Triglycerides: 92 mg/dL (ref 0.0–149.0)
VLDL: 18.4 mg/dL (ref 0.0–40.0)

## 2021-03-29 LAB — TSH: TSH: 2.74 u[IU]/mL (ref 0.35–5.50)

## 2021-03-29 NOTE — Progress Notes (Signed)
Subjective:  Patient ID: Mary Vasquez, female    DOB: [birth year]  Age: 80 y.o. MRN: [id number]  CC: The primary encounter diagnosis was Hyperlipidemia with target LDL less than 100. Diagnoses of Hypothyroidism due to acquired atrophy of thyroid, Stage 3a chronic kidney disease (HCC), Major depressive disorder, recurrent episode with melancholic features (HCC), and Overweight (BMI 25.0-29.9) were also pertinent to this visit.  HPI Mary Vasquez presents for  Chief Complaint  Patient presents with   Follow-up    6 month follow up on hypothyroidism, hyperlipidemia and elevated blood pressure.   This visit occurred during the SARS-CoV-2 public health emergency.  Safety protocols were in place, including screening questions prior to the visit, additional usage of staff PPE, and extensive cleaning of exam room while observing appropriate contact time as indicated for disinfecting solutions.   Taking atorvastatin . Tolerating medication.without side effects.  Taking synthroid as directed.  Energy level is good and weight is stable.   Taking sertraline, but no longer feels she needs it.  denies depression getting worse.  Wants more energy. Sleeps well most nights, used melatonin in the past , only one bladder break per night.   CKD:  seeing Thedore Mins in October.  Renal ultrasound was done. Avoiding NSAIDs   HTN:  readings for the last few days reviewed.  80% of readings < 140/80 in both  arms  Osteopenia:  T scores reviewed.   -1.9 hip  2021   Weight gain:  Doing silver sneakers 2/week  does not walk or do other exercise,  no outdoor activities.       Outpatient Medications Prior to Visit  Medication Sig Dispense Refill   atorvastatin (LIPITOR) 20 MG tablet TAKE 1 TABLET BY MOUTH EVERY DAY 90 tablet 3   cetirizine (ZYRTEC) 10 MG tablet Take 10 mg by mouth daily.     Cholecalciferol (VITAMIN D3) 25 MCG (1000 UT) CAPS Take 1,000 Units by mouth 2 (two) times daily.     levothyroxine  (SYNTHROID) 25 MCG tablet TAKE 1 TABLET BY MOUTH DAILY BEFORE BREAKFAST EXCEPT ON SUNDAY AND THURSDAY TAKE 2 TABS 114 tablet 1   Probiotic Product (TRUBIOTICS PO) Take by mouth daily.     sertraline (ZOLOFT) 50 MG tablet TAKE 1 TABLET BY MOUTH EVERY DAY 90 tablet 2   Turmeric Curcumin 500 MG CAPS Take by mouth daily.     No facility-administered medications prior to visit.    Review of Systems;  Patient denies headache, fevers, malaise, unintentional weight loss, skin rash, eye pain, sinus congestion and sinus pain, sore throat, dysphagia,  hemoptysis , cough, dyspnea, wheezing, chest pain, palpitations, orthopnea, edema, abdominal pain, nausea, melena, diarrhea, constipation, flank pain, dysuria, hematuria, urinary  Frequency, nocturia, numbness, tingling, seizures,  Focal weakness, Loss of consciousness,  Tremor, insomnia, depression, anxiety, and suicidal ideation.      Objective:  BP 138/86 (BP Location: Left Arm, Patient Position: Sitting, Cuff Size: Normal)   Pulse 72   Temp (!) 96.1 F (35.6 C) (Temporal)   Ht 5\' 1"  (1.549 m)   Wt 155 lb 12.8 oz (70.7 kg)   SpO2 97%   BMI 29.44 kg/m   BP Readings from Last 3 Encounters:  03/29/21 138/86  09/23/20 132/84  09/21/20 (!) 148/78    Wt Readings from Last 3 Encounters:  03/29/21 155 lb 12.8 oz (70.7 kg)  09/21/20 154 lb (69.9 kg)  06/09/20 156 lb (70.8 kg)    General appearance: alert, cooperative and appears  stated age Ears: normal TM's and external ear canals both ears Throat: lips, mucosa, and tongue normal; teeth and gums normal Neck: no adenopathy, no carotid bruit, supple, symmetrical, trachea midline and thyroid not enlarged, symmetric, no tenderness/mass/nodules Back: symmetric, no curvature. ROM normal. No CVA tenderness. Lungs: clear to auscultation bilaterally Heart: regular rate and rhythm, S1, S2 normal, no murmur, click, rub or gallop Abdomen: soft, non-tender; bowel sounds normal; no masses,  no  organomegaly Pulses: 2+ and symmetric Skin: Skin color, texture, turgor normal. No rashes or lesions Lymph nodes: Cervical, supraclavicular, and axillary nodes normal.  Lab Results  Component Value Date   HGBA1C 5.9 09/21/2020   HGBA1C 5.8 02/17/2020   HGBA1C 5.6 04/29/2019    Lab Results  Component Value Date   CREATININE 1.00 03/29/2021   CREATININE 1.03 09/21/2020   CREATININE 1.10 02/24/2020    Lab Results  Component Value Date   WBC 5.8 09/05/2018   HGB 12.5 09/05/2018   HCT 37.0 09/05/2018   PLT 291.0 09/05/2018   GLUCOSE 100 (H) 03/29/2021   CHOL 189 03/29/2021   TRIG 92.0 03/29/2021   HDL 73.50 03/29/2021   LDLDIRECT 91.0 09/09/2015   LDLCALC 97 03/29/2021   ALT 18 03/29/2021   AST 31 03/29/2021   NA 138 03/29/2021   K 4.4 03/29/2021   CL 102 03/29/2021   CREATININE 1.00 03/29/2021   BUN 12 03/29/2021   CO2 28 03/29/2021   TSH 2.74 03/29/2021   INR 1.11 07/31/2018   HGBA1C 5.9 09/21/2020   MICROALBUR 0.8 09/21/2020    MM 3D SCREEN BREAST BILATERAL  Result Date: 06/05/2020 CLINICAL DATA:  Screening. EXAM: DIGITAL SCREENING BILATERAL MAMMOGRAM WITH TOMO AND CAD COMPARISON:  Previous exam(s). ACR Breast Density Category c: The breast tissue is heterogeneously dense, which may obscure small masses. FINDINGS: There are no findings suspicious for malignancy. Images were processed with CAD. IMPRESSION: No mammographic evidence of malignancy. A result letter of this screening mammogram will be mailed directly to the patient. RECOMMENDATION: Screening mammogram in one year. (Code:SM-B-01Y) BI-RADS CATEGORY  1: Negative. Electronically Signed   By: Amie Portland M.D.   On: 06/05/2020 15:56    Assessment & Plan:   Problem List Items Addressed This Visit       Unprioritized   Hyperlipidemia with target LDL less than 100 - Primary    Improved with atorvastatin 20 mg dose  LDL is at goal .  LFTs are normal.  Lab Results  Component Value Date   CHOL 189  03/29/2021   HDL 73.50 03/29/2021   LDLCALC 97 03/29/2021   LDLDIRECT 91.0 09/09/2015   TRIG 92.0 03/29/2021   CHOLHDL 3 03/29/2021         Relevant Orders   Lipid panel (Completed)   Overweight (BMI 25.0-29.9)    Weight gain addressed.     Recommended exercise,  Low GI diet.       Hypothyroidism    Thyroid function  Is normal on 25 mcg daily of levothyroxine.   Lab Results  Component Value Date   TSH 2.74 03/29/2021         Relevant Orders   TSH (Completed)   CKD (chronic kidney disease) stage 3, GFR 30-59 ml/min (HCC)   Relevant Orders   Comprehensive metabolic panel (Completed)   Major depressive disorder, recurrent episode with melancholic features (HCC)    Improved with sertraline.  She has determined that she no longer needs the medication.  Discussed wean.  No orders of the defined types were placed in this encounter.   There are no discontinued medications.  Follow-up: No follow-ups on file.   Sherlene Shams, MD

## 2021-03-29 NOTE — Patient Instructions (Addendum)
WASA crackers   Think small dill pickles,  carrots/celery with good ranch dressing   Healthy Choice low carb power bowl entrees and FITKITCHEN cauliflower pizza bowls are great low carb entrees that microwave in 5 minutes     Because you have osteopenia,  we will repeat your bone density next year  You need 1200 mg of calcium daily to support your bones. I recommend getting  at least half of your calcium and Vitamin D  through diet rather than supplements given the recent association of calcium supplements with increased coronary artery calcium scores  Atkins protein shake will give you nearly half of your  dietary calcium and vitamin D.

## 2021-03-30 NOTE — Assessment & Plan Note (Addendum)
Improved with sertraline.  She has determined that she no longer needs the medication.  Discussed wean.

## 2021-03-30 NOTE — Assessment & Plan Note (Signed)
Weight gain addressed.     Recommended exercise,  Low GI diet.

## 2021-03-30 NOTE — Assessment & Plan Note (Signed)
Improved with atorvastatin 20 mg dose  LDL is at goal .  LFTs are normal.  Lab Results  Component Value Date   CHOL 189 03/29/2021   HDL 73.50 03/29/2021   LDLCALC 97 03/29/2021   LDLDIRECT 91.0 09/09/2015   TRIG 92.0 03/29/2021   CHOLHDL 3 03/29/2021

## 2021-03-30 NOTE — Assessment & Plan Note (Signed)
Thyroid function  Is normal on 25 mcg daily of levothyroxine.   Lab Results  Component Value Date   TSH 2.74 03/29/2021

## 2021-04-10 ENCOUNTER — Ambulatory Visit (INDEPENDENT_AMBULATORY_CARE_PROVIDER_SITE_OTHER): Payer: PPO

## 2021-04-10 ENCOUNTER — Other Ambulatory Visit: Payer: Self-pay

## 2021-04-10 DIAGNOSIS — Z23 Encounter for immunization: Secondary | ICD-10-CM

## 2021-04-24 DIAGNOSIS — N1831 Chronic kidney disease, stage 3a: Secondary | ICD-10-CM | POA: Diagnosis not present

## 2021-05-22 ENCOUNTER — Other Ambulatory Visit: Payer: Self-pay | Admitting: Internal Medicine

## 2021-05-22 DIAGNOSIS — Z1231 Encounter for screening mammogram for malignant neoplasm of breast: Secondary | ICD-10-CM

## 2021-06-07 ENCOUNTER — Ambulatory Visit
Admission: RE | Admit: 2021-06-07 | Discharge: 2021-06-07 | Disposition: A | Discharge status: Home / Self Care | Payer: PPO | Source: Ambulatory Visit | Attending: Internal Medicine | Admitting: Internal Medicine

## 2021-06-07 ENCOUNTER — Other Ambulatory Visit: Payer: Self-pay

## 2021-06-07 DIAGNOSIS — Z1231 Encounter for screening mammogram for malignant neoplasm of breast: Secondary | ICD-10-CM | POA: Insufficient documentation

## 2021-06-12 ENCOUNTER — Ambulatory Visit: Payer: PPO

## 2021-06-16 ENCOUNTER — Other Ambulatory Visit: Payer: Self-pay | Admitting: Internal Medicine

## 2021-07-12 ENCOUNTER — Other Ambulatory Visit: Payer: Self-pay | Admitting: Internal Medicine

## 2021-07-14 ENCOUNTER — Telehealth: Payer: Self-pay | Admitting: Internal Medicine

## 2021-07-14 NOTE — Telephone Encounter (Signed)
Copied from CRM [id number]. Topic: Medicare AWV >> Jul 14, 2021 10:38 AM Harris-Coley, [place] wrote: Reason for CRM: LVM 07/14/21 AWV appt changed to 08/02/21 at 8:30am. Please confirm appt date change-khc

## 2021-08-02 ENCOUNTER — Ambulatory Visit (INDEPENDENT_AMBULATORY_CARE_PROVIDER_SITE_OTHER): Payer: PPO

## 2021-08-02 VITALS — Ht 61.0 in | Wt 155.0 lb

## 2021-08-02 DIAGNOSIS — Z0000 Encounter for general adult medical examination without abnormal findings: Secondary | ICD-10-CM | POA: Diagnosis not present

## 2021-08-02 NOTE — Patient Instructions (Addendum)
Mary Vasquez , Thank you for taking time to come for your Medicare Wellness Visit. I appreciate your ongoing commitment to your health goals. Please review the following plan we discussed and let me know if I can assist you in the future.   These are the goals we discussed:  Goals       Patient Stated     Weight (lb) < 140 lb (63.5 kg) (pt-stated)      Intermittent fasting Stay active         This is a list of the screening recommended for you and due dates:  Health Maintenance  Topic Date Due   COVID-19 Vaccine (4 - Booster for Pfizer series) 08/18/2021*   Mammogram  06/07/2022   Tetanus Vaccine  03/23/2031   Pneumonia Vaccine  Completed   Flu Shot  Completed   DEXA scan (bone density measurement)  Completed   Hepatitis C Screening: USPSTF Recommendation to screen - Ages 41-79 yo.  Completed   Zoster (Shingles) Vaccine  Completed   HPV Vaccine  Aged Out  *Topic was postponed. The date shown is not the original due date.    Advanced directives: not yet completed  Conditions/risks identified: none new  Follow up in one year for your annual wellness visit    Preventive Care 65 Years and Older, Female Preventive care refers to lifestyle choices and visits with your health care provider that can promote health and wellness. What does preventive care include? A yearly physical exam. This is also called an annual well check. Dental exams once or twice a year. Routine eye exams. Ask your health care provider how often you should have your eyes checked. Personal lifestyle choices, including: Daily care of your teeth and gums. Regular physical activity. Eating a healthy diet. Avoiding tobacco and drug use. Limiting alcohol use. Practicing safe sex. Taking low-dose aspirin every day. Taking vitamin and mineral supplements as recommended by your health care provider. What happens during an annual well check? The services and screenings done by your health care provider during  your annual well check will depend on your age, overall health, lifestyle risk factors, and family history of disease. Counseling  Your health care provider may ask you questions about your: Alcohol use. Tobacco use. Drug use. Emotional well-being. Home and relationship well-being. Sexual activity. Eating habits. History of falls. Memory and ability to understand (cognition). Work and work Astronomer. Reproductive health. Screening  You may have the following tests or measurements: Height, weight, and BMI. Blood pressure. Lipid and cholesterol levels. These may be checked every 5 years, or more frequently if you are over 33 years old. Skin check. Lung cancer screening. You may have this screening every year starting at age 55 if you have a 30-pack-year history of smoking and currently smoke or have quit within the past 15 years. Fecal occult blood test (FOBT) of the stool. You may have this test every year starting at age 26. Flexible sigmoidoscopy or colonoscopy. You may have a sigmoidoscopy every 5 years or a colonoscopy every 10 years starting at age 62. Hepatitis C blood test. Hepatitis B blood test. Sexually transmitted disease (STD) testing. Diabetes screening. This is done by checking your blood sugar (glucose) after you have not eaten for a while (fasting). You may have this done every 1-3 years. Bone density scan. This is done to screen for osteoporosis. You may have this done starting at age 53. Mammogram. This may be done every 1-2 years. Talk to your health  care provider about how often you should have regular mammograms. Talk with your health care provider about your test results, treatment options, and if necessary, the need for more tests. Vaccines  Your health care provider may recommend certain vaccines, such as: Influenza vaccine. This is recommended every year. Tetanus, diphtheria, and acellular pertussis (Tdap, Td) vaccine. You may need a Td booster every 10  years. Zoster vaccine. You may need this after age 42. Pneumococcal 13-valent conjugate (PCV13) vaccine. One dose is recommended after age 45. Pneumococcal polysaccharide (PPSV23) vaccine. One dose is recommended after age 76. Talk to your health care provider about which screenings and vaccines you need and how often you need them. This information is not intended to replace advice given to you by your health care provider. Make sure you discuss any questions you have with your health care provider. Document Released: 07/22/2015 Document Revised: 03/14/2016 Document Reviewed: 04/26/2015 Elsevier Interactive Patient Education  2017 [employer].  Fall Prevention in the Home Falls can cause injuries. They can happen to people of all ages. There are many things you can do to make your home safe and to help prevent falls. What can I do on the outside of my home? Regularly fix the edges of walkways and driveways and fix any cracks. Remove anything that might make you trip as you walk through a door, such as a raised step or threshold. Trim any bushes or trees on the path to your home. Use bright outdoor lighting. Clear any walking paths of anything that might make someone trip, such as rocks or tools. Regularly check to see if handrails are loose or broken. Make sure that both sides of any steps have handrails. Any raised decks and porches should have guardrails on the edges. Have any leaves, snow, or ice cleared regularly. Use sand or salt on walking paths during winter. Clean up any spills in your garage right away. This includes oil or grease spills. What can I do in the bathroom? Use night lights. Install grab bars by the toilet and in the tub and shower. Do not use towel bars as grab bars. Use non-skid mats or decals in the tub or shower. If you need to sit down in the shower, use a plastic, non-slip stool. Keep the floor dry. Clean up any water that spills on the floor as soon as it  happens. Remove soap buildup in the tub or shower regularly. Attach bath mats securely with double-sided non-slip rug tape. Do not have throw rugs and other things on the floor that can make you trip. What can I do in the bedroom? Use night lights. Make sure that you have a light by your bed that is easy to reach. Do not use any sheets or blankets that are too big for your bed. They should not hang down onto the floor. Have a firm chair that has side arms. You can use this for support while you get dressed. Do not have throw rugs and other things on the floor that can make you trip. What can I do in the kitchen? Clean up any spills right away. Avoid walking on wet floors. Keep items that you use a lot in easy-to-reach places. If you need to reach something above you, use a strong step stool that has a grab bar. Keep electrical cords out of the way. Do not use floor polish or wax that makes floors slippery. If you must use wax, use non-skid floor wax. Do not have throw  rugs and other things on the floor that can make you trip. What can I do with my stairs? Do not leave any items on the stairs. Make sure that there are handrails on both sides of the stairs and use them. Fix handrails that are broken or loose. Make sure that handrails are as long as the stairways. Check any carpeting to make sure that it is firmly attached to the stairs. Fix any carpet that is loose or worn. Avoid having throw rugs at the top or bottom of the stairs. If you do have throw rugs, attach them to the floor with carpet tape. Make sure that you have a light switch at the top of the stairs and the bottom of the stairs. If you do not have them, ask someone to add them for you. What else can I do to help prevent falls? Wear shoes that: Do not have high heels. Have rubber bottoms. Are comfortable and fit you well. Are closed at the toe. Do not wear sandals. If you use a stepladder: Make sure that it is fully opened.  Do not climb a closed stepladder. Make sure that both sides of the stepladder are locked into place. Ask someone to hold it for you, if possible. Clearly mark and make sure that you can see: Any grab bars or handrails. First and last steps. Where the edge of each step is. Use tools that help you move around (mobility aids) if they are needed. These include: Canes. Walkers. Scooters. Crutches. Turn on the lights when you go into a dark area. Replace any light bulbs as soon as they burn out. Set up your furniture so you have a clear path. Avoid moving your furniture around. If any of your floors are uneven, fix them. If there are any pets around you, be aware of where they are. Review your medicines with your doctor. Some medicines can make you feel dizzy. This can increase your chance of falling. Ask your doctor what other things that you can do to help prevent falls. This information is not intended to replace advice given to you by your health care provider. Make sure you discuss any questions you have with your health care provider. Document Released: 04/21/2009 Document Revised: 12/01/2015 Document Reviewed: 07/30/2014 Elsevier Interactive Patient Education  2017 [employer].

## 2021-08-02 NOTE — Progress Notes (Addendum)
Subjective:   Mary Vasquez is a 81 y.o. female who presents for Medicare Annual (Subsequent) preventive examination.  Review of Systems    No ROS.  Medicare Wellness Virtual Visit.  Visual/audio telehealth visit, UTA vital signs.   See social history for additional risk factors.   Cardiac Risk Factors include: advanced age (>51men, >36 women)     Objective:    Today's Vitals   08/02/21 0826  Weight: 155 lb (70.3 kg)  Height: 5\' 1"  (1.549 m)   Body mass index is 29.29 kg/m.  Advanced Directives 08/02/2021 06/09/2020 12/23/2019 11/25/2019 06/09/2019 01/24/2019 08/13/2018  Does Patient Have a Medical Advance Directive? No No No No Yes No No  Type of Advance Directive - - - - Press photographer - -  Does patient want to make changes to medical advance directive? - - - - No - Patient declined - -  Copy of [place] in Chart? - - - - No - copy requested - -  Would patient like information on creating a medical advance directive? No - Patient declined No - Patient declined No - Patient declined No - Patient declined - No - Patient declined Yes (MAU/Ambulatory/Procedural Areas - Information given)    Current Medications (verified) Outpatient Encounter Medications as of 08/02/2021  Medication Sig   atorvastatin (LIPITOR) 20 MG tablet TAKE 1 TABLET BY MOUTH EVERY DAY   cetirizine (ZYRTEC) 10 MG tablet Take 10 mg by mouth daily.   Cholecalciferol (VITAMIN D3) 25 MCG (1000 UT) CAPS Take 1,000 Units by mouth 2 (two) times daily.   levothyroxine (SYNTHROID) 25 MCG tablet TAKE 1 TABLET BY MOUTH DAILY BEFORE BREAKFAST EXCEPT ON SUNDAY AND THURSDAY TAKE 2 TABS   Probiotic Product (TRUBIOTICS PO) Take by mouth daily.   sertraline (ZOLOFT) 50 MG tablet TAKE 1 TABLET BY MOUTH EVERY DAY   Turmeric Curcumin 500 MG CAPS Take by mouth daily.   No facility-administered encounter medications on file as of 08/02/2021.    Allergies (verified) Hydrocodone, Penicillin g, Lexapro  [escitalopram], Wellbutrin [bupropion], and Augmentin [amoxicillin-pot clavulanate]   History: Past Medical History:  Diagnosis Date   Arthritis    Depression    situational   Family history of adverse reaction to anesthesia    Mother - altered mental state for a day or 2(age 79)   History of kidney stones    Hyperlipidemia    Hypertension    Hypothyroidism    Vertigo    none for several years   Past Surgical History:  Procedure Laterality Date   BREAST BIOPSY Left 2009   CORE - NEG...marker in breast   BREAST SURGERY  2009   stereotactic,  left,  benign    CATARACT EXTRACTION W/PHACO Left 11/25/2019   Procedure: CATARACT EXTRACTION PHACO AND INTRAOCULAR LENS PLACEMENT (IOC) LEFT 9.58 01:13.8 12.9%;  Surgeon: Leandrew Koyanagi, MD;  Location: [facility];  Service: Ophthalmology;  Laterality: Left;   CATARACT EXTRACTION W/PHACO Right 12/23/2019   Procedure: CATARACT EXTRACTION PHACO AND INTRAOCULAR LENS PLACEMENT (IOC) RIGHT 12.74  01:08.4  18.6%;  Surgeon: Leandrew Koyanagi, MD;  Location: [facility];  Service: Ophthalmology;  Laterality: Right;   HYSTEROSCOPY  2008   ROTATOR CUFF REPAIR Right 2009   [facility]   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   Procedure: TOTAL HIP ARTHROPLASTY ANTERIOR APPROACH;  Surgeon: Lovell Sheehan, MD;  Location: [facility];  Service: Orthopedics;  Laterality: Right;   Family History  Problem Relation  Age of Onset   Heart disease Mother 49       ami   Heart disease Father        ami   Diabetes Brother    Cancer Brother        prostate ca   Breast cancer Paternal Aunt        28'S   Breast cancer Cousin    Social History   Socioeconomic History   Marital status: Divorced    Spouse name: Not on file   Number of children: Not on file   Years of education: Not on file   Highest education level: Not on file  Occupational History   Occupation: receptionist    Comment: retired  Tobacco Use   Smoking status: Never    Smokeless tobacco: Never  Vaping Use   Vaping Use: Never used  Substance and Sexual Activity   Alcohol use: Yes    Alcohol/week: 3.0 standard drinks    Types: 3 Glasses of wine per week   Drug use: No   Sexual activity: Not Currently  Other Topics Concern   Not on file  Social History Narrative   Not on file   Social Determinants of Health   Financial Resource Strain: Low Risk    Difficulty of Paying Living Expenses: Not hard at all  Food Insecurity: No Food Insecurity   Worried About Charity fundraiser in the Last Year: Never true   [place] in the Last Year: Never true  Transportation Needs: No Transportation Needs   Lack of Transportation (Medical): No   Lack of Transportation (Non-Medical): No  Physical Activity: Sufficiently Active   Days of Exercise per Week: 3 days   Minutes of Exercise per Session: 50 min  Stress: No Stress Concern Present   Feeling of Stress : Not at all  Social Connections: Unknown   Frequency of Communication with Friends and Family: More than three times a week   Frequency of Social Gatherings with Friends and Family: More than three times a week   Attends Religious Services: Not on Electrical engineer or Organizations: Not on file   Attends Archivist Meetings: Not on file   Marital Status: Not on file    Tobacco Counseling Counseling given: Not Answered   Clinical Intake:  Pre-visit preparation completed: Yes        Diabetes: No  How often do you need to have someone help you when you read instructions, pamphlets, or other written materials from your doctor or pharmacy?: 1 - Never Interpreter Needed?: No      Activities of Daily Living In your present state of health, do you have any difficulty performing the following activities: 08/02/2021  Hearing? N  Vision? N  Difficulty concentrating or making decisions? N  Walking or climbing stairs? N  Dressing or bathing? N  Doing errands, shopping?  N  Preparing Food and eating ? N  Using the Toilet? N  In the past six months, have you accidently leaked urine? N  Do you have problems with loss of bowel control? N  Managing your Medications? N  Managing your Finances? N  Housekeeping or managing your Housekeeping? N  Some recent data might be hidden    Patient Care Team: Crecencio Mc, MD as PCP - General (Internal Medicine)  Indicate any recent Medical Services you may have received from other than [facility] providers in the past year (date may be approximate).  Assessment:   This is a routine wellness examination for Chauntel.  Virtual Visit via Telephone Note  I connected with  Butler on 08/02/21 at  8:15 AM EST by telephone and verified that I am speaking with the correct person using two identifiers.  Persons participating in the virtual visit: patient/Nurse Health Advisor   I discussed the limitations, risks, security and privacy concerns of performing an evaluation and management service by telephone and the availability of in person appointments. The patient expressed understanding and agreed to proceed.  Interactive audio and video telecommunications were attempted between this nurse and patient, however failed, due to patient having technical difficulties OR patient did not have access to video capability.  We continued and completed visit with audio only.  Some vital signs may be absent or patient reported.   Hearing/Vision screen Hearing Screening - Comments:: Patient is able to hear conversational tones without difficulty. No issues reported. Vision Screening - Comments:: Follow[facility], Dr. Wallace Going Wears glasses    Dietary issues and exercise activities discussed: Current Exercise Habits: Home exercise routine, Intensity: Mild Regular diet Intermittent fasting Good water intake   Goals Addressed               This Visit's Progress     Patient Stated     Weight (lb) < 140  lb (63.5 kg) (pt-stated)   155 lb (70.3 kg)     Intermittent fasting Stay active        Depression Screen PHQ 2/9 Scores 08/02/2021 03/29/2021 09/21/2020 06/09/2020 03/21/2020 06/09/2019 12/10/2017  PHQ - 2 Score 0 0 2 1 2 1  0  PHQ- 9 Score - 2 5 - 9 - 2    Fall Risk Fall Risk  08/02/2021 03/29/2021 09/21/2020 06/09/2020 03/21/2020  Falls in the past year? 0 0 0 0 0  Comment - - - - -  Number falls in past yr: 0 - - 0 -  Injury with Fall? - - - 0 -  Follow up Falls evaluation completed Falls evaluation completed Falls evaluation completed Falls evaluation completed Falls evaluation completed    [place]: Home free of loose throw rugs in walkways, pet beds, electrical cords, etc? Yes  Adequate lighting in your home to reduce risk of falls? Yes   ASSISTIVE DEVICES UTILIZED TO PREVENT FALLS: Life alert? No  Use of a cane, walker or w/c? No   TIMED UP AND GO: Was the test performed? No .   Cognitive Function: Patient is alert and oriented x3.  Enjoys daily sodoku.  MMSE - Mini Mental State Exam 11/28/2015  Orientation to time 5  Orientation to Place 5  Registration 3  Attention/ Calculation 5  Recall 3  Language- name 2 objects 2  Language- repeat 1  Language- follow 3 step command 3  Language- read & follow direction 1  Write a sentence 1  Copy design 1  Total score 30     6CIT Screen 08/02/2021 06/09/2020 06/09/2019  What Year? 0 points 0 points 0 points  What month? 0 points 0 points 0 points  What time? 0 points 0 points 0 points  Count back from 20 0 points 0 points 0 points  Months in reverse 0 points 0 points 0 points  Repeat phrase 0 points - 0 points  Total Score 0 - 0    Immunizations Immunization History  Administered Date(s) Administered   Fluad Quad(high Dose 65+) 04/15/2019, 04/10/2021  Influenza Nasal 04/11/2015   Influenza, High Dose Seasonal PF 05/28/2016, 06/02/2018, 05/24/2020, 05/24/2020   Influenza-Unspecified  04/20/2013, 04/16/2014   PFIZER(Purple Top)SARS-COV-2 Vaccination 07/30/2019, 08/20/2019, 05/24/2020   Pneumococcal Conjugate-13 04/23/2013   Pneumococcal Polysaccharide-23 04/23/2006, 06/14/2014   Tdap 07/09/2009, 03/22/2021   Zoster Recombinat (Shingrix) 03/22/2021, 07/17/2021   Screening Tests Health Maintenance  Topic Date Due   COVID-19 Vaccine (4 - Booster for Pfizer series) 08/18/2021 (Originally 07/19/2020)   MAMMOGRAM  06/07/2022   TETANUS/TDAP  03/23/2031   Pneumonia Vaccine 71+ Years old  Completed   INFLUENZA VACCINE  Completed   DEXA SCAN  Completed   Hepatitis C Screening  Completed   Zoster Vaccines- Shingrix  Completed   HPV VACCINES  Aged Out   Health Maintenance There are no preventive care reminders to display for this patient.  Lung Cancer Screening: (Low Dose CT Chest recommended if Age 88-80 years, 30 pack-year currently smoking OR have quit w/in 15years.) does not qualify.   Vision Screening: Recommended annual ophthalmology exams for early detection of glaucoma and other disorders of the eye.  Dental Screening: Recommended annual dental exams for proper oral hygiene  Community Resource Referral / Chronic Care Management: CRR required this visit?  No   CCM required this visit?  No      Plan:   Keep all routine maintenance appointments.   I have personally reviewed and noted the following in the patients chart:   Medical and social history Use of alcohol, tobacco or illicit drugs  Current medications and supplements including opioid prescriptions. Not taking opioid.  Functional ability and status Nutritional status Physical activity Advanced directives List of other physicians Hospitalizations, surgeries, and ER visits in previous 12 months Vitals Screenings to include cognitive, depression, and falls Referrals and appointments  In addition, I have reviewed and discussed with patient certain preventive protocols, quality metrics, and best  practice recommendations. A written personalized care plan for preventive services as well as general preventive health recommendations were provided to patient.     OBrien-Blaney, Joaquina Nissen L, LPN   [phone]      I have reviewed the above information and agree with above.   Deborra Medina, MD

## 2021-08-08 ENCOUNTER — Ambulatory Visit: Payer: PPO

## 2021-08-14 DIAGNOSIS — H04123 Dry eye syndrome of bilateral lacrimal glands: Secondary | ICD-10-CM | POA: Diagnosis not present

## 2021-08-30 ENCOUNTER — Other Ambulatory Visit: Payer: Self-pay

## 2021-08-30 ENCOUNTER — Ambulatory Visit (INDEPENDENT_AMBULATORY_CARE_PROVIDER_SITE_OTHER): Payer: PPO | Admitting: Internal Medicine

## 2021-08-30 ENCOUNTER — Encounter: Payer: Self-pay | Admitting: Internal Medicine

## 2021-08-30 VITALS — BP 122/82 | HR 62 | Temp 97.5°F | Ht 61.0 in | Wt 156.2 lb

## 2021-08-30 DIAGNOSIS — R1030 Lower abdominal pain, unspecified: Secondary | ICD-10-CM

## 2021-08-30 DIAGNOSIS — M25552 Pain in left hip: Secondary | ICD-10-CM | POA: Diagnosis not present

## 2021-08-30 DIAGNOSIS — R110 Nausea: Secondary | ICD-10-CM | POA: Diagnosis not present

## 2021-08-30 DIAGNOSIS — Z87442 Personal history of urinary calculi: Secondary | ICD-10-CM | POA: Diagnosis not present

## 2021-08-30 DIAGNOSIS — R399 Unspecified symptoms and signs involving the genitourinary system: Secondary | ICD-10-CM

## 2021-08-30 DIAGNOSIS — R1032 Left lower quadrant pain: Secondary | ICD-10-CM | POA: Diagnosis not present

## 2021-08-30 DIAGNOSIS — R300 Dysuria: Secondary | ICD-10-CM | POA: Diagnosis not present

## 2021-08-30 DIAGNOSIS — R509 Fever, unspecified: Secondary | ICD-10-CM

## 2021-08-30 DIAGNOSIS — R109 Unspecified abdominal pain: Secondary | ICD-10-CM

## 2021-08-30 LAB — CBC WITH DIFFERENTIAL/PLATELET
Basophils Absolute: 0 10*3/uL (ref 0.0–0.1)
Basophils Relative: 0.4 % (ref 0.0–3.0)
Eosinophils Absolute: 0 10*3/uL (ref 0.0–0.7)
Eosinophils Relative: 0.4 % (ref 0.0–5.0)
HCT: 38.2 % (ref 36.0–46.0)
Hemoglobin: 12.7 g/dL (ref 12.0–15.0)
Lymphocytes Relative: 18.3 % (ref 12.0–46.0)
Lymphs Abs: 1.5 10*3/uL (ref 0.7–4.0)
MCHC: 33.3 g/dL (ref 30.0–36.0)
MCV: 92.5 fl (ref 78.0–100.0)
Monocytes Absolute: 0.9 10*3/uL (ref 0.1–1.0)
Monocytes Relative: 11.1 % (ref 3.0–12.0)
Neutro Abs: 5.6 10*3/uL (ref 1.4–7.7)
Neutrophils Relative %: 69.8 % (ref 43.0–77.0)
Platelets: 214 10*3/uL (ref 150.0–400.0)
RBC: 4.13 Mil/uL (ref 3.87–5.11)
RDW: 13.2 % (ref 11.5–15.5)
WBC: 8.1 10*3/uL (ref 4.0–10.5)

## 2021-08-30 LAB — COMPREHENSIVE METABOLIC PANEL
ALT: 16 U/L (ref 0–35)
AST: 25 U/L (ref 0–37)
Albumin: 4.1 g/dL (ref 3.5–5.2)
Alkaline Phosphatase: 54 U/L (ref 39–117)
BUN: 18 mg/dL (ref 6–23)
CO2: 30 mEq/L (ref 19–32)
Calcium: 9.1 mg/dL (ref 8.4–10.5)
Chloride: 100 mEq/L (ref 96–112)
Creatinine, Ser: 1.06 mg/dL (ref 0.40–1.20)
GFR: 49.84 mL/min — ABNORMAL LOW (ref >60.00)
Glucose, Bld: 97 mg/dL (ref 70–99)
Potassium: 3.8 mEq/L (ref 3.5–5.1)
Sodium: 134 mEq/L — ABNORMAL LOW (ref 135–145)
Total Bilirubin: 1 mg/dL (ref 0.2–1.2)
Total Protein: 6.8 g/dL (ref 6.0–8.3)

## 2021-08-30 MED ORDER — ONDANSETRON HCL 4 MG PO TABS: 4.0000 mg | ORAL_TABLET | Freq: Two times a day (BID) | ORAL | 0 refills | Status: DC | PRN

## 2021-08-30 MED ORDER — CIPROFLOXACIN HCL 500 MG PO TABS: 500.0000 mg | ORAL_TABLET | Freq: Two times a day (BID) | ORAL | 0 refills | Status: AC

## 2021-08-30 NOTE — Patient Instructions (Addendum)
Consider CT abdomen and pelvis   Water and cranberry supplements or light sugar 50% cranberry juice Align or culturelle probiotics  AZO for Uti with pyridium    We can do Xray left hip call back to schedule this or do on the day you see Dr. Darrick Huntsmanullo and if abnormal call Dr. Odis LusterBowers to follow up    Hip Exercises Ask your health care provider which exercises are safe for you. Do exercises exactly as told by your health care provider and adjust them as directed. It is normal to feel mild stretching, pulling, tightness, or discomfort as you do these exercises. Stop right away if you feel sudden pain or your pain gets worse. Do not begin these exercises until told by your health care provider. Stretching and range-of-motion exercises These exercises warm up your muscles and joints and improve the movement and flexibility of your hip. These exercises also help to relieve pain, numbness, and tingling. You may be asked to limit your range of motion if you had a hip replacement. Talk to your health care provider about these restrictions. Hamstrings, supine  Lie on your back (supine position). Loop a belt or towel over the ball of your left / right foot. The ball of your foot is on the walking surface, right under your toes. Straighten your left / right knee and slowly pull on the belt or towel to raise your leg until you feel a gentle stretch behind your knee (hamstring). Do not let your knee bend while you do this. Keep your other leg flat on the floor. Hold this position for __________ seconds. Slowly return your leg to the starting position. Repeat __________ times. Complete this exercise __________ times a day. Hip rotation  Lie on your back on a firm surface. With your left / right hand, gently pull your left / right knee toward the shoulder that is on the same side of the body. Stop when your knee is pointing toward the ceiling. Hold your left / right ankle with your other hand. Keeping your  knee steady, gently pull your left / right ankle toward your other shoulder until you feel a stretch in your buttocks. Keep your hips and shoulders firmly planted while you do this stretch. Hold this position for __________ seconds. Repeat __________ times. Complete this exercise __________ times a day. Seated stretch This exercise is sometimes called hamstrings and adductors stretch. Sit on the floor with your legs stretched wide. Keep your knees straight during this exercise. Keeping your head and back in a straight line, bend at your waist to reach for your left foot (position A). You should feel a stretch in your right inner thigh (adductors). Hold this position for __________ seconds. Then slowly return to the upright position. Keeping your head and back in a straight line, bend at your waist to reach forward (position B). You should feel a stretch behind both of your thighs and knees (hamstrings). Hold this position for __________ seconds. Then slowly return to the upright position. Keeping your head and back in a straight line, bend at your waist to reach for your right foot (position C). You should feel a stretch in your left inner thigh (adductors). Hold this position for __________ seconds. Then slowly return to the upright position. Repeat __________ times. Complete this exercise __________ times a day. Lunge This exercise stretches the muscles of the hip (hip flexors). Place your left / right knee on the floor and bend your other knee so that is  directly over your ankle. You should be half-kneeling. Keep good posture with your head over your shoulders. Tighten your buttocks to point your tailbone downward. This will prevent your back from arching too much. You should feel a gentle stretch in the front of your left / right thigh and hip. If you do not feel a stretch, slide your other foot forward slightly and then slowly lunge forward with your chest up until your knee once again lines  up over your ankle. Make sure your tailbone continues to point downward. Hold this position for __________ seconds. Slowly return to the starting position. Repeat __________ times. Complete this exercise __________ times a day. Strengthening exercises These exercises build strength and endurance in your hip. Endurance is the ability to use your muscles for a long time, even after they get tired. Bridge This exercise strengthens the muscles of your hip (hip extensors). Lie on your back on a firm surface with your knees bent and your feet flat on the floor. Tighten your buttocks muscles and lift your bottom off the floor until the trunk of your body and your hips are level with your thighs. Do not arch your back. You should feel the muscles working in your buttocks and the back of your thighs. If you do not feel these muscles, slide your feet 1-2 inches (2.5-5 cm) farther away from your buttocks. Hold this position for __________ seconds. Slowly lower your hips to the starting position. Let your muscles relax completely between repetitions. Repeat __________ times. Complete this exercise __________ times a day. Straight leg raises, side-lying This exercise strengthens the muscles that move the hip joint away from the center of the body (hip abductors). Lie on your side with your left / right leg in the top position. Lie so your head, shoulder, hip, and knee line up. You may bend your bottom knee slightly to help you balance. Roll your hips slightly forward, so your hips are stacked directly over each other and your left / right knee is facing forward. Leading with your heel, lift your top leg 4-6 inches (10-15 cm). You should feel the muscles in your top hip lifting. Do not let your foot drift forward. Do not let your knee roll toward the ceiling. Hold this position for __________ seconds. Slowly return to the starting position. Let your muscles relax completely between repetitions. Repeat  __________ times. Complete this exercise __________ times a day. Straight leg raises, side-lying This exercise strengthens the muscles that move the hip joint toward the center of the body (hip adductors). Lie on your side with your left / right leg in the bottom position. Lie so your head, shoulder, hip, and knee line up. You may place your upper foot in front to help you balance. Roll your hips slightly forward, so your hips are stacked directly over each other and your left / right knee is facing forward. Tense the muscles in your inner thigh and lift your bottom leg 4-6 inches (10-15 cm). Hold this position for __________ seconds. Slowly return to the starting position. Let your muscles relax completely between repetitions. Repeat __________ times. Complete this exercise __________ times a day. Straight leg raises, supine This exercise strengthens the muscles in the front of your thigh (quadriceps). Lie on your back (supine position) with your left / right leg extended and your other knee bent. Tense the muscles in the front of your left / right thigh. You should see your kneecap slide up or see increased dimpling just  above your knee. Keep these muscles tight as you raise your leg 4-6 inches (10-15 cm) off the floor. Do not let your knee bend. Hold this position for __________ seconds. Keep these muscles tense as you lower your leg. Relax the muscles slowly and completely between repetitions. Repeat __________ times. Complete this exercise __________ times a day. Hip abductors, standing This exercise strengthens the muscles that move the leg and hip joint away from the center of the body (hip abductors). Tie one end of a rubber exercise band or tubing to a secure surface, such as a chair, table, or pole. Loop the other end of the band or tubing around your left / right ankle. Keeping your ankle with the band or tubing directly opposite the secured end, step away until there is tension in  the tubing or band. Hold on to a chair, table, or pole as needed for balance. Lift your left / right leg out to your side. While you do this: Keep your back upright. Keep your shoulders over your hips. Keep your toes pointing forward. Make sure to use your hip muscles to slowly lift your leg. Do not tip your body or forcefully lift your leg. Hold this position for __________ seconds. Slowly return to the starting position. Repeat __________ times. Complete this exercise __________ times a day. Squats This exercise strengthens the muscles in the front of your thigh (quadriceps). Stand in a door frame so your feet and knees are in line with the frame. You may place your hands on the frame for balance. Slowly bend your knees and lower your hips like you are going to sit in a chair. Keep your lower legs in a straight-up-and-down position. Do not let your hips go lower than your knees. Do not bend your knees lower than told by your health care provider. If your hip pain increases, do not bend as low. Hold this position for ___________ seconds. Slowly push with your legs to return to standing. Do not use your hands to pull yourself to standing. Repeat __________ times. Complete this exercise __________ times a day. This information is not intended to replace advice given to you by your health care provider. Make sure you discuss any questions you have with your health care provider. Document Revised: 11/09/2020 Document Reviewed: 11/09/2020 Elsevier Patient Education  2022 [employer]  Urinary Tract Infection, Adult A urinary tract infection (UTI) is an infection of any part of the urinary tract. The urinary tract includes the kidneys, ureters, bladder, and urethra. These organs make, store, and get rid of urine in the body. An upper UTI affects the ureters and kidneys. A lower UTI affects the bladder and urethra. What are the causes? Most urinary tract infections are caused by bacteria in  your genital area around your urethra, where urine leaves your body. These bacteria grow and cause inflammation of your urinary tract. What increases the risk? You are more likely to develop this condition if: You have a urinary catheter that stays in place. You are not able to control when you urinate or have a bowel movement (incontinence). You are female and you: Use a spermicide or diaphragm for birth control. Have low estrogen levels. Are pregnant. You have certain genes that increase your risk. You are sexually active. You take antibiotic medicines. You have a condition that causes your flow of urine to slow down, such as: An enlarged prostate, if you are female. Blockage in your urethra. A kidney stone. A nerve condition that  affects your bladder control (neurogenic bladder). Not getting enough to drink, or not urinating often. You have certain medical conditions, such as: Diabetes. A weak disease-fighting system (immunesystem). Sickle cell disease. Gout. Spinal cord injury. What are the signs or symptoms? Symptoms of this condition include: Needing to urinate right away (urgency). Frequent urination. This may include small amounts of urine each time you urinate. Pain or burning with urination. Blood in the urine. Urine that smells bad or unusual. Trouble urinating. Cloudy urine. Vaginal discharge, if you are female. Pain in the abdomen or the lower back. You may also have: Vomiting or a decreased appetite. Confusion. Irritability or tiredness. A fever or chills. Diarrhea. The first symptom in older adults may be confusion. In some cases, they may not have any symptoms until the infection has worsened. How is this diagnosed? This condition is diagnosed based on your medical history and a physical exam. You may also have other tests, including: Urine tests. Blood tests. Tests for STIs (sexually transmitted infections). If you have had more than one UTI, a cystoscopy  or imaging studies may be done to determine the cause of the infections. How is this treated? Treatment for this condition includes: Antibiotic medicine. Over-the-counter medicines to treat discomfort. Drinking enough water to stay hydrated. If you have frequent infections or have other conditions such as a kidney stone, you may need to see a health care provider who specializes in the urinary tract (urologist). In rare cases, urinary tract infections can cause sepsis. Sepsis is a life-threatening condition that occurs when the body responds to an infection. Sepsis is treated in the hospital with IV antibiotics, fluids, and other medicines. Follow these instructions at home: Medicines Take over-the-counter and prescription medicines only as told by your health care provider. If you were prescribed an antibiotic medicine, take it as told by your health care provider. Do not stop using the antibiotic even if you start to feel better. General instructions Make sure you: Empty your bladder often and completely. Do not hold urine for long periods of time. Empty your bladder after sex. Wipe from front to back after urinating or having a bowel movement if you are female. Use each tissue only one time when you wipe. Drink enough fluid to keep your urine pale yellow. Keep all follow-up visits. This is important. Contact a health care provider if: Your symptoms do not get better after 1-2 days. Your symptoms go away and then return. Get help right away if: You have severe pain in your back or your lower abdomen. You have a fever or chills. You have nausea or vomiting. Summary A urinary tract infection (UTI) is an infection of any part of the urinary tract, which includes the kidneys, ureters, bladder, and urethra. Most urinary tract infections are caused by bacteria in your genital area. Treatment for this condition often includes antibiotic medicines. If you were prescribed an antibiotic  medicine, take it as told by your health care provider. Do not stop using the antibiotic even if you start to feel better. Keep all follow-up visits. This is important. This information is not intended to replace advice given to you by your health care provider. Make sure you discuss any questions you have with your health care provider. Document Revised: 02/05/2020 Document Reviewed: 02/05/2020 Elsevier Patient Education  2022 [employer].

## 2021-08-30 NOTE — Progress Notes (Addendum)
Chief Complaint  Patient presents with   Urinary Tract Infection   F/u LLQ, left flank discomfort x Sunday to Tuesday dehydrated urine dark, pressure/pain, dysuria, no blood, temp > 102, +nausea w/o vomiting 5/10   C/o left hip pain mild h/o right hip replacement      Review of Systems  Constitutional:  Negative for weight loss.  HENT:  Negative for hearing loss.   Eyes:  Negative for blurred vision.  Respiratory:  Negative for shortness of breath.   Cardiovascular:  Negative for chest pain.  Gastrointestinal:  Negative for abdominal pain and blood in stool.  Genitourinary:  Negative for dysuria.  Musculoskeletal:  Positive for joint pain. Negative for falls.  Skin:  Negative for rash.  Neurological:  Negative for headaches.  Psychiatric/Behavioral:  Negative for depression.   Past Medical History:  Diagnosis Date   Arthritis    Depression    situational   Family history of adverse reaction to anesthesia    Mother - altered mental state for a day or 2(age 80)   History of kidney stones    Hyperlipidemia    Hypertension    Hypothyroidism    Vertigo    none for several years   Past Surgical History:  Procedure Laterality Date   BREAST BIOPSY Left 2009   CORE - NEG...marker in breast   BREAST SURGERY  2009   stereotactic,  left,  benign    CATARACT EXTRACTION W/PHACO Left 11/25/2019   Procedure: CATARACT EXTRACTION PHACO AND INTRAOCULAR LENS PLACEMENT (IOC) LEFT 9.58 01:13.8 12.9%;  Surgeon: Brasington, Chadwick, MD;  Location: [facility];  Service: Ophthalmology;  Laterality: Left;   CATARACT EXTRACTION W/PHACO Right 12/23/2019   Procedure: CATARACT EXTRACTION PHACO AND INTRAOCULAR LENS PLACEMENT (IOC) RIGHT 12.74  01:08.4  18.6%;  Surgeon: Brasington, Chadwick, MD;  Location: [facility];  Service: Ophthalmology;  Laterality: Right;   HYSTEROSCOPY  2008   ROTATOR CUFF REPAIR Right 2009   [facility]   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   Procedure:  TOTAL HIP ARTHROPLASTY ANTERIOR APPROACH;  Surgeon: Bowers, James R, MD;  Location: [facility];  Service: Orthopedics;  Laterality: Right;   Family History  Problem Relation Age of Onset   Heart disease Mother 65       ami   Heart disease Father        ami   Diabetes Brother    Cancer Brother        prostate ca   Breast cancer Paternal Aunt        50 'S   Breast cancer Cousin    Social History   Socioeconomic History   Marital status: Divorced    Spouse name: Not on file   Number of children: Not on file   Years of education: Not on file   Highest education level: Not on file  Occupational History   Occupation: receptionist    Comment: retired  Tobacco Use   Smoking status: Never   Smokeless tobacco: Never  Vaping Use   Vaping Use: Never used  Substance and Sexual Activity   Alcohol use: Yes    Alcohol/week: 3.0 standard drinks    Types: 3 Glasses of wine per week   Drug use: No   Sexual activity: Not Currently  Other Topics Concern   Not on file  Social History Narrative   Not on file   Social Determinants of Health   Financial Resource Strain: Low Risk    Difficulty of Paying Living  Expenses: Not hard at all  Food Insecurity: No Food Insecurity   Worried About Programme researcher, broadcasting/film/video in the Last Year: Never true   Ran Out of Food in the Last Year: Never true  Transportation Needs: No Transportation Needs   Lack of Transportation (Medical): No   Lack of Transportation (Non-Medical): No  Physical Activity: Sufficiently Active   Days of Exercise per Week: 3 days   Minutes of Exercise per Session: 50 min  Stress: No Stress Concern Present   Feeling of Stress : Not at all  Social Connections: Unknown   Frequency of Communication with Friends and Family: More than three times a week   Frequency of Social Gatherings with Friends and Family: More than three times a week   Attends Religious Services: Not on Scientist, clinical (histocompatibility and immunogenetics) or Organizations: Not on file    Attends Banker Meetings: Not on file   Marital Status: Not on file  Intimate Partner Violence: Not At Risk   Fear of Current or Ex-Partner: No   Emotionally Abused: No   Physically Abused: No   Sexually Abused: No   Current Meds  Medication Sig   atorvastatin (LIPITOR) 20 MG tablet TAKE 1 TABLET BY MOUTH EVERY DAY   cetirizine (ZYRTEC) 10 MG tablet Take 10 mg by mouth daily.   Cholecalciferol (VITAMIN D3) 25 MCG (1000 UT) CAPS Take 1,000 Units by mouth 2 (two) times daily.   ciprofloxacin (CIPRO) 500 MG tablet Take 1 tablet (500 mg total) by mouth 2 (two) times daily for 5 days. With food   levothyroxine (SYNTHROID) 25 MCG tablet TAKE 1 TABLET BY MOUTH DAILY BEFORE BREAKFAST EXCEPT ON SUNDAY AND THURSDAY TAKE 2 TABS   ondansetron (ZOFRAN) 4 MG tablet Take 1 tablet (4 mg total) by mouth 2 (two) times daily as needed.   Probiotic Product (TRUBIOTICS PO) Take by mouth daily.   sertraline (ZOLOFT) 50 MG tablet TAKE 1 TABLET BY MOUTH EVERY DAY   Turmeric Curcumin 500 MG CAPS Take by mouth daily.   Allergies  Allergen Reactions   Hydrocodone Nausea And Vomiting   Penicillin G Hives and Other (See Comments)    Only had reaction to Augmentin DID THE REACTION INVOLVE: Swelling of the face/tongue/throat, SOB, or low BP? Unknown Sudden or severe rash/hives, skin peeling, or the inside of the mouth or nose? Unknown Did it require medical treatment? Unknown When did it last happen? Unknown If all above answers are "NO", may proceed with cephalosporin use.    Lexapro [Escitalopram]     Made her feel like a zombie    Wellbutrin [Bupropion]     Made hyperactive    Augmentin [Amoxicillin-Pot Clavulanate] Hives   No results found for this or any previous visit (from the past 2160 hour(s)). Objective  Body mass index is 29.51 kg/m. Wt Readings from Last 3 Encounters:  08/30/21 156 lb 3.2 oz (70.9 kg)  08/02/21 155 lb (70.3 kg)  03/29/21 155 lb 12.8 oz (70.7 kg)   Temp  Readings from Last 3 Encounters:  08/30/21 (!) 97.5 F (36.4 C) (Oral)  03/29/21 (!) 96.1 F (35.6 C) (Temporal)  09/21/20 98.3 F (36.8 C) (Oral)   BP Readings from Last 3 Encounters:  08/30/21 122/82  03/29/21 138/86  09/23/20 132/84   Pulse Readings from Last 3 Encounters:  08/30/21 62  03/29/21 72  09/23/20 (!) 56    Physical Exam Vitals and nursing note reviewed.  Constitutional:  Appearance: Normal appearance. She is well-developed and well-groomed.  HENT:     Head: Normocephalic and atraumatic.  Eyes:     Conjunctiva/sclera: Conjunctivae normal.     Pupils: Pupils are equal, round, and reactive to light.  Cardiovascular:     Rate and Rhythm: Normal rate and regular rhythm.     Heart sounds: Normal heart sounds. No murmur heard. Pulmonary:     Effort: Pulmonary effort is normal.     Breath sounds: Normal breath sounds.  Abdominal:     General: Abdomen is flat. Bowel sounds are normal.     Tenderness: There is abdominal tenderness in the epigastric area, left upper quadrant and left lower quadrant.  Musculoskeletal:     Left hip: Tenderness present.     Comments: Mild left hip pain  Skin:    General: Skin is warm and dry.  Neurological:     General: No focal deficit present.     Mental Status: She is alert and oriented to person, place, and time. Mental status is at baseline.     Cranial Nerves: Cranial nerves 2-12 are intact.     Motor: Motor function is intact.     Coordination: Coordination is intact.     Gait: Gait is intact.  Psychiatric:        Attention and Perception: Attention and perception normal.        Mood and Affect: Mood and affect normal.        Speech: Speech normal.        Behavior: Behavior normal. Behavior is cooperative.        Thought Content: Thought content normal.        Cognition and Memory: Cognition and memory normal.        Judgment: Judgment normal.    Assessment  Plan  UTI symptoms - Plan: Urine Culture, Urinalysis,  Comprehensive metabolic panel, CBC with Differential/Platelet, ciprofloxacin (CIPRO) 500 MG tablet bid x 5 days Prn tylenol fever   Left flank pain, ab pain epigastric, luq, llq- Plan: Comprehensive metabolic panel, CBC with Differential/Platelet, ciprofloxacin (CIPRO) 500 MG tablet bid x 5 days  CT ab/pelvis consider due to abdominal pain on exam   History of kidney stones - Plan: ciprofloxacin (CIPRO) 500 MG tablet  Nausea Prn zofran    Left hip pain - Plan: DG Hip Unilat W OR W/O Pelvis 2-3 Views Left F/u Dr. Odis Luster  Left groin pain - Plan: DG Hip Unilat W OR W/O Pelvis 2-3 Views Left    Provider: Dr. French Ana McLean-Scocuzza-Internal Medicine

## 2021-08-31 ENCOUNTER — Ambulatory Visit: Payer: PPO | Admitting: Internal Medicine

## 2021-08-31 LAB — URINALYSIS
Bilirubin Urine: NEGATIVE
Glucose, UA: NEGATIVE
Ketones, ur: NEGATIVE
Nitrite: NEGATIVE
Specific Gravity, Urine: 1.004 (ref 1.001–1.035)
pH: 5.5 (ref 5.0–8.0)

## 2021-09-01 LAB — URINE CULTURE
MICRO NUMBER:: 13042656
SPECIMEN QUALITY:: ADEQUATE

## 2021-09-20 ENCOUNTER — Other Ambulatory Visit: Payer: Self-pay | Admitting: Internal Medicine

## 2021-09-27 ENCOUNTER — Other Ambulatory Visit: Payer: Self-pay

## 2021-09-27 ENCOUNTER — Ambulatory Visit (INDEPENDENT_AMBULATORY_CARE_PROVIDER_SITE_OTHER): Payer: PPO | Admitting: Internal Medicine

## 2021-09-27 ENCOUNTER — Encounter: Payer: Self-pay | Admitting: Internal Medicine

## 2021-09-27 VITALS — BP 138/84 | HR 53 | Temp 98.2°F | Ht 61.0 in | Wt 157.6 lb

## 2021-09-27 DIAGNOSIS — F5105 Insomnia due to other mental disorder: Secondary | ICD-10-CM | POA: Diagnosis not present

## 2021-09-27 DIAGNOSIS — F339 Major depressive disorder, recurrent, unspecified: Secondary | ICD-10-CM | POA: Diagnosis not present

## 2021-09-27 DIAGNOSIS — R7303 Prediabetes: Secondary | ICD-10-CM | POA: Diagnosis not present

## 2021-09-27 DIAGNOSIS — R5383 Other fatigue: Secondary | ICD-10-CM

## 2021-09-27 DIAGNOSIS — E034 Atrophy of thyroid (acquired): Secondary | ICD-10-CM

## 2021-09-27 DIAGNOSIS — N1831 Chronic kidney disease, stage 3a: Secondary | ICD-10-CM

## 2021-09-27 DIAGNOSIS — E785 Hyperlipidemia, unspecified: Secondary | ICD-10-CM | POA: Diagnosis not present

## 2021-09-27 DIAGNOSIS — Z1231 Encounter for screening mammogram for malignant neoplasm of breast: Secondary | ICD-10-CM | POA: Diagnosis not present

## 2021-09-27 DIAGNOSIS — R7301 Impaired fasting glucose: Secondary | ICD-10-CM | POA: Diagnosis not present

## 2021-09-27 DIAGNOSIS — R030 Elevated blood-pressure reading, without diagnosis of hypertension: Secondary | ICD-10-CM

## 2021-09-27 DIAGNOSIS — F418 Other specified anxiety disorders: Secondary | ICD-10-CM | POA: Diagnosis not present

## 2021-09-27 LAB — COMPREHENSIVE METABOLIC PANEL
ALT: 22 U/L (ref 0–35)
AST: 31 U/L (ref 0–37)
Albumin: 4.2 g/dL (ref 3.5–5.2)
Alkaline Phosphatase: 58 U/L (ref 39–117)
BUN: 15 mg/dL (ref 6–23)
CO2: 26 mEq/L (ref 19–32)
Calcium: 9.2 mg/dL (ref 8.4–10.5)
Chloride: 103 mEq/L (ref 96–112)
Creatinine, Ser: 1.08 mg/dL (ref 0.40–1.20)
GFR: 48.71 mL/min — ABNORMAL LOW (ref >60.00)
Glucose, Bld: 93 mg/dL (ref 70–99)
Potassium: 3.6 mEq/L (ref 3.5–5.1)
Sodium: 138 mEq/L (ref 135–145)
Total Bilirubin: 0.6 mg/dL (ref 0.2–1.2)
Total Protein: 7.2 g/dL (ref 6.0–8.3)

## 2021-09-27 LAB — CBC WITH DIFFERENTIAL/PLATELET
Basophils Absolute: 0 10*3/uL (ref 0.0–0.1)
Basophils Relative: 0.9 % (ref 0.0–3.0)
Eosinophils Absolute: 0.1 10*3/uL (ref 0.0–0.7)
Eosinophils Relative: 2 % (ref 0.0–5.0)
HCT: 37.4 % (ref 36.0–46.0)
Hemoglobin: 12.6 g/dL (ref 12.0–15.0)
Lymphocytes Relative: 38.6 % (ref 12.0–46.0)
Lymphs Abs: 2.1 10*3/uL (ref 0.7–4.0)
MCHC: 33.8 g/dL (ref 30.0–36.0)
MCV: 92.1 fl (ref 78.0–100.0)
Monocytes Absolute: 0.4 10*3/uL (ref 0.1–1.0)
Monocytes Relative: 8.2 % (ref 3.0–12.0)
Neutro Abs: 2.7 10*3/uL (ref 1.4–7.7)
Neutrophils Relative %: 50.3 % (ref 43.0–77.0)
Platelets: 175 10*3/uL (ref 150.0–400.0)
RBC: 4.06 Mil/uL (ref 3.87–5.11)
RDW: 13.5 % (ref 11.5–15.5)
WBC: 5.4 10*3/uL (ref 4.0–10.5)

## 2021-09-27 LAB — HEMOGLOBIN A1C: Hgb A1c MFr Bld: 6 % (ref 4.6–6.5)

## 2021-09-27 LAB — LIPID PANEL
Cholesterol: 160 mg/dL (ref 0–200)
HDL: 74.3 mg/dL (ref >39.00)
LDL Cholesterol: 71 mg/dL (ref 0–99)
NonHDL: 86.05
Total CHOL/HDL Ratio: 2
Triglycerides: 75 mg/dL (ref 0.0–149.0)
VLDL: 15 mg/dL (ref 0.0–40.0)

## 2021-09-27 LAB — MICROALBUMIN / CREATININE URINE RATIO
Creatinine,U: 24.4 mg/dL
Microalb Creat Ratio: 2.9 mg/g (ref 0.0–30.0)
Microalb, Ur: <0.7 mg/dL (ref 0.0–1.9)

## 2021-09-27 LAB — TSH: TSH: 2.44 u[IU]/mL (ref 0.35–5.50)

## 2021-09-27 NOTE — Patient Instructions (Addendum)
?  However, The new "normal"  for  preventing progression of kidney disease is a blood pressure between 120/70 and 130/80.   and you have been above that on the last 3-4 office visits  .  Please check your blood pressure a few times at home and send me the readings so I can determine if you need to start a medication to lower your blood pressure . ? ? ?  ?

## 2021-09-27 NOTE — Assessment & Plan Note (Addendum)
BP was 165 systolic during nephrology visit and medication was advised /offered but declined by patient. She was upset over something seh had witnessed in the waiting room.    Patient checks at home and most recent reading was 138/80  Rechecked later on in the  same day and  lower .  No changes today  .   ? ?Lab Results  ?Component Value Date  ? MICROALBUR <0.7 09/27/2021  ? MICROALBUR 0.8 09/21/2020  ? ? ? ?

## 2021-09-27 NOTE — Progress Notes (Signed)
Patient ID: Mary Vasquez, female    DOB: [birth year]  Age: 81 y.o. MRN: [id number] ? ?The patient is here for follow up and management of other chronic and acute problems. ? ?This visit occurred during the SARS-CoV-2 public health emergency.  Safety protocols were in place, including screening questions prior to the visit, additional usage of staff PPE, and extensive cleaning of exam room while observing appropriate contact time as indicated for disinfecting solutions.  ? ?  ?The risk factors are reflected in the social history. ? ?The roster of all physicians providing medical care to patient - is listed in the Snapshot section of the chart. ? ?Activities of daily living:  The patient is 100% independent in all ADLs: dressing, toileting, feeding as well as independent mobility ? ?Home safety : The patient has smoke detectors in the home. They wear seatbelts.  There are no firearms at home. There is no violence in the home.  ? ?There is no risks for hepatitis, STDs or HIV. There is no   history of blood transfusion. They have no travel history to infectious disease endemic areas of the world. ? ?The patient has seen their dentist in the last six month. They have seen their eye doctor in the last year. They admit to slight hearing difficulty with regard to whispered voices and some television programs.  They have deferred audiologic testing in the last year.  They do not  have excessive sun exposure. Discussed the need for sun protection: hats, long sleeves and use of sunscreen if there is significant sun exposure.  ? ?Diet: the importance of a healthy diet is discussed. They do have a healthy diet. ? ?The benefits of regular aerobic exercise were discussed. She exercises for 60 minutes 4 times per week  (Silver Sneakers and line  dancing )   ? ?Depression screen: there are no signs or vegative symptoms of depression- irritability, change in appetite, anhedonia, sadness/tearfullness. ? ?Cognitive assessment: the  patient manages all their financial and personal affairs and is actively engaged. They could relate day,date,year and events; recalled 2/3 objects at 3 minutes; performed clock-face test normally. ? ?The following portions of the patient's history were reviewed and updated as appropriate: allergies, current medications, past family history, past medical history,  past surgical history, past social history  and problem list. ? ?Visual acuity was not assessed per patient preference since she has regular follow up with her ophthalmologist. Hearing and body mass index were assessed and reviewed.  ? ?During the course of the visit the patient was educated and counseled about appropriate screening and preventive services including : fall prevention , diabetes screening, nutrition counseling, colorectal cancer screening, and recommended immunizations.   ? ?CC: The primary encounter diagnosis was Hypothyroidism due to acquired atrophy of thyroid. Diagnoses of Hyperlipidemia with target LDL less than 100, Stage 3a chronic kidney disease ([place]), Impaired fasting glucose, Other fatigue, Encounter for screening mammogram for malignant neoplasm of breast, Prediabetes, Elevated blood pressure reading, Major depressive disorder, recurrent episode with melancholic features ([place]), and Insomnia secondary to depression with anxiety were also pertinent to this visit. ? ?1)  HM  :  mammogram WNL NOV 2022.  COLOGUARD April 2021 NORMAL.  ? ?2)  CKD seeing Nephrology for recent persistent decline in GFR.  Renal ultrasound done in September 29, 2019 and reviewed with patient  ? ?3) Depression ;  doing ok,  has noted  some residual grief following her dtr 's death in Sep 29, 2019 aggravated by the loss of  another friend recently , and another one starting  chemo for invasive brca .  Longevity runs in her family: brother is 40. She lives alone with 2 cats,  a younger brother is alive with diabetes. Staying busy but keeping to herself. Her gentleman friend of ten  years is a retired Automotive engineer  who is suffering from advanced dementia  but is in great physical health and has a nearby supportive daughter  ? ?4)  ? ?History ?Kelsye has a past medical history of Arthritis, Depression, Family history of adverse reaction to anesthesia, History of kidney stones, Hyperlipidemia, Hypertension, Hypothyroidism, and Vertigo.  ? ?She has a past surgical history that includes Hysteroscopy (2008); Rotator cuff repair (Right, 2009); Breast surgery (2009); Breast biopsy (Left, 2009); Total hip arthroplasty (Right, 08/13/2018); Cataract extraction w/PHACO (Left, 11/25/2019); and Cataract extraction w/PHACO (Right, 12/23/2019).  ? ?Her family history includes Breast cancer in her cousin and paternal aunt; Cancer in her brother; Diabetes in her brother; Heart disease in her father; Heart disease (age of onset: 12) in her mother.She reports that she has never smoked. She has never used smokeless tobacco. She reports current alcohol use of about 3.0 standard drinks per week. She reports that she does not use drugs. ? ?Outpatient Medications Prior to Visit  ?Medication Sig Dispense Refill  ? atorvastatin (LIPITOR) 20 MG tablet TAKE 1 TABLET BY MOUTH EVERY DAY 90 tablet 3  ? cetirizine (ZYRTEC) 10 MG tablet Take 10 mg by mouth daily.    ? Cholecalciferol (VITAMIN D3) 25 MCG (1000 UT) CAPS Take 1,000 Units by mouth 2 (two) times daily.    ? levothyroxine (SYNTHROID) 25 MCG tablet TAKE 1 TABLET BY MOUTH DAILY BEFORE BREAKFAST EXCEPT ON SUNDAY AND THURSDAY TAKE 2 TABS 114 tablet 1  ? Probiotic Product (TRUBIOTICS PO) Take by mouth daily.    ? sertraline (ZOLOFT) 50 MG tablet TAKE 1 TABLET BY MOUTH EVERY DAY 90 tablet 2  ? Turmeric Curcumin 500 MG CAPS Take by mouth daily.    ? ondansetron (ZOFRAN) 4 MG tablet Take 1 tablet (4 mg total) by mouth 2 (two) times daily as needed. (Patient not taking: Reported on 09/27/2021) 40 tablet 0  ? ?No facility-administered medications prior to visit.  ? ? ?Review  of Systems ? ?Patient denies headache, fevers, malaise, unintentional weight loss, skin rash, eye pain, sinus congestion and sinus pain, sore throat, dysphagia,  hemoptysis , cough, dyspnea, wheezing, chest pain, palpitations, orthopnea, edema, abdominal pain, nausea, melena, diarrhea, constipation, flank pain, dysuria, hematuria, urinary  Frequency, nocturia, numbness, tingling, seizures,  Focal weakness, Loss of consciousness,  Tremor, insomnia, depression, anxiety, and suicidal ideation.   ? ? ?Objective:  ?BP 138/84 (BP Location: Left Arm, Patient Position: Sitting, Cuff Size: Normal)   Pulse (!) 53   Temp 98.2 ?F (36.8 ?C) (Oral)   Ht 5' 1"  (1.549 m)   Wt 157 lb 9.6 oz (71.5 kg)   SpO2 97%   BMI 29.78 kg/m?  ? ?Physical Exam ? ? ?General appearance: alert, cooperative and appears stated age ?Head: Normocephalic, without obvious abnormality, atraumatic ?Eyes: conjunctivae/corneas clear. PERRL, EOM's intact. Fundi benign. ?Ears: normal TM's and external ear canals both ears ?Nose: Nares normal. Septum midline. Mucosa normal. No drainage or sinus tenderness. ?Throat: lips, mucosa, and tongue normal; teeth and gums normal ?Neck: no adenopathy, no carotid bruit, no JVD, supple, symmetrical, trachea midline and thyroid not enlarged, symmetric, no tenderness/mass/nodules ?Lungs: clear to auscultation bilaterally ?Breasts: normal appearance, no masses or tenderness ?  Heart: regular rate and rhythm, S1, S2 normal, no murmur, click, rub or gallop ?Abdomen: soft, non-tender; bowel sounds normal; no masses,  no organomegaly ?Extremities: extremities normal, atraumatic, no cyanosis or edema ?Pulses: 2+ and symmetric ?Skin: Skin color, texture, turgor normal. No rashes or lesions ?Neurologic: Alert and oriented X 3, normal strength and tone. Normal symmetric reflexes. Normal coordination and gait.    ?Assessment & Plan:  ? ?Problem List Items Addressed This Visit   ? ? Major depressive disorder, recurrent episode with  melancholic features ([place])  ?  Her mood had  been stable  with sertraline but she had weaned off of it.   Given her recent grief,  She has requested that we resume the medication ,  Which she has done  ?  ?  ? Insom

## 2021-09-28 NOTE — Assessment & Plan Note (Signed)
with atorvastatin 20 mg dose  LDL is at goal .  LFTs are normal. ? ?Lab Results  ?Component Value Date  ? CHOL 160 09/27/2021  ? HDL 74.30 09/27/2021  ? LDLCALC 71 09/27/2021  ? LDLDIRECT 91.0 09/09/2015  ? TRIG 75.0 09/27/2021  ? CHOLHDL 2 09/27/2021  ? ? ?

## 2021-09-28 NOTE — Assessment & Plan Note (Signed)
Using  melatonin at dinner time  ?

## 2021-09-28 NOTE — Assessment & Plan Note (Addendum)
Her mood had  been stable  with sertraline but she had weaned off of it.   Given her recent grief,  She has requested that we resume the medication ,  Which she has done  ?

## 2021-09-28 NOTE — Assessment & Plan Note (Signed)
Thyroid function is WNL on current dose.  No current changes needed.  ? ?Lab Results  ?Component Value Date  ? TSH 2.44 09/27/2021  ? ? ?

## 2022-01-10 ENCOUNTER — Other Ambulatory Visit: Payer: Self-pay | Admitting: Internal Medicine

## 2022-01-23 ENCOUNTER — Telehealth: Payer: Self-pay | Admitting: Internal Medicine

## 2022-01-23 NOTE — Telephone Encounter (Signed)
Spoke to Patient to let her know that her BP readings are on goal for her age and to continue with her medications. Patient voiced understanding.

## 2022-01-23 NOTE — Telephone Encounter (Signed)
Patient stated that she received an e-mail and there was no content.  2.  Patient provided the following:  Blood Pressure Readings 07/07 @3pm  - 126/66   60 (res) 07/08 @11am  - 131/66   55 (res) 07/09 @330pm  - 143/71  57 (res)  07/10 @1030am  - 134/73  58 (res) 07/11 @ 430pm  - 145/70  60 (res)

## 2022-03-29 ENCOUNTER — Encounter: Payer: Self-pay | Admitting: Internal Medicine

## 2022-03-30 ENCOUNTER — Encounter: Payer: Self-pay | Admitting: Internal Medicine

## 2022-03-30 ENCOUNTER — Ambulatory Visit (INDEPENDENT_AMBULATORY_CARE_PROVIDER_SITE_OTHER): Payer: PPO | Admitting: Internal Medicine

## 2022-03-30 VITALS — BP 128/84 | HR 63 | Temp 97.9°F | Ht 61.0 in | Wt 154.6 lb

## 2022-03-30 DIAGNOSIS — E785 Hyperlipidemia, unspecified: Secondary | ICD-10-CM | POA: Diagnosis not present

## 2022-03-30 DIAGNOSIS — F339 Major depressive disorder, recurrent, unspecified: Secondary | ICD-10-CM | POA: Diagnosis not present

## 2022-03-30 DIAGNOSIS — N1831 Chronic kidney disease, stage 3a: Secondary | ICD-10-CM

## 2022-03-30 DIAGNOSIS — E034 Atrophy of thyroid (acquired): Secondary | ICD-10-CM

## 2022-03-30 DIAGNOSIS — R5383 Other fatigue: Secondary | ICD-10-CM

## 2022-03-30 DIAGNOSIS — Z23 Encounter for immunization: Secondary | ICD-10-CM

## 2022-03-30 LAB — CBC WITH DIFFERENTIAL/PLATELET
Basophils Absolute: 0 10*3/uL (ref 0.0–0.1)
Basophils Relative: 0.6 % (ref 0.0–3.0)
Eosinophils Absolute: 0.1 10*3/uL (ref 0.0–0.7)
Eosinophils Relative: 2.3 % (ref 0.0–5.0)
HCT: 38.1 % (ref 36.0–46.0)
Hemoglobin: 12.9 g/dL (ref 12.0–15.0)
Lymphocytes Relative: 37.4 % (ref 12.0–46.0)
Lymphs Abs: 1.6 10*3/uL (ref 0.7–4.0)
MCHC: 33.7 g/dL (ref 30.0–36.0)
MCV: 93 fl (ref 78.0–100.0)
Monocytes Absolute: 0.4 10*3/uL (ref 0.1–1.0)
Monocytes Relative: 9.3 % (ref 3.0–12.0)
Neutro Abs: 2.1 10*3/uL (ref 1.4–7.7)
Neutrophils Relative %: 50.4 % (ref 43.0–77.0)
Platelets: 178 10*3/uL (ref 150.0–400.0)
RBC: 4.1 Mil/uL (ref 3.87–5.11)
RDW: 13.9 % (ref 11.5–15.5)
WBC: 4.1 10*3/uL (ref 4.0–10.5)

## 2022-03-30 LAB — COMPREHENSIVE METABOLIC PANEL
ALT: 17 U/L (ref 0–35)
AST: 28 U/L (ref 0–37)
Albumin: 3.9 g/dL (ref 3.5–5.2)
Alkaline Phosphatase: 57 U/L (ref 39–117)
BUN: 20 mg/dL (ref 6–23)
CO2: 25 mEq/L (ref 19–32)
Calcium: 9.2 mg/dL (ref 8.4–10.5)
Chloride: 103 mEq/L (ref 96–112)
Creatinine, Ser: 1.08 mg/dL (ref 0.40–1.20)
GFR: 48.53 mL/min — ABNORMAL LOW (ref >60.00)
Glucose, Bld: 95 mg/dL (ref 70–99)
Potassium: 4.1 mEq/L (ref 3.5–5.1)
Sodium: 138 mEq/L (ref 135–145)
Total Bilirubin: 0.9 mg/dL (ref 0.2–1.2)
Total Protein: 6.9 g/dL (ref 6.0–8.3)

## 2022-03-30 LAB — B12 AND FOLATE PANEL
Folate: 23.4 ng/mL (ref >5.9)
Vitamin B-12: 235 pg/mL (ref 211–911)

## 2022-03-30 LAB — LIPID PANEL
Cholesterol: 178 mg/dL (ref 0–200)
HDL: 69.8 mg/dL (ref >39.00)
LDL Cholesterol: 90 mg/dL (ref 0–99)
NonHDL: 108.44
Total CHOL/HDL Ratio: 3
Triglycerides: 92 mg/dL (ref 0.0–149.0)
VLDL: 18.4 mg/dL (ref 0.0–40.0)

## 2022-03-30 LAB — IBC + FERRITIN
Ferritin: 113.6 ng/mL (ref 10.0–291.0)
Iron: 140 ug/dL (ref 42–145)
Saturation Ratios: 48.3 % (ref 20.0–50.0)
TIBC: 289.8 ug/dL (ref 250.0–450.0)
Transferrin: 207 mg/dL — ABNORMAL LOW (ref 212.0–360.0)

## 2022-03-30 LAB — MICROALBUMIN / CREATININE URINE RATIO
Creatinine,U: 87 mg/dL
Microalb Creat Ratio: 0.8 mg/g (ref 0.0–30.0)
Microalb, Ur: <0.7 mg/dL (ref 0.0–1.9)

## 2022-03-30 LAB — TSH: TSH: 1.7 u[IU]/mL (ref 0.35–5.50)

## 2022-03-30 LAB — LDL CHOLESTEROL, DIRECT: Direct LDL: 92 mg/dL

## 2022-03-30 NOTE — Assessment & Plan Note (Addendum)
Multifactorial,  With grief/depression and deconditioning contributing.   She lost her duaghter unexpectedly 3 years ago and now her companion of ten years has entered a facitlity due to advanced dementia. She has ewonderful relationships with her grandchildren. Screening for deficiencies in progress .  counselling given and advised to consider change in SSRI therapy if all labs are unrevealing.  She did not tolerate wellbutrin in the past.

## 2022-03-30 NOTE — Progress Notes (Signed)
Subjective:  Patient ID: Mary Vasquez, female    DOB: 10-24-1940  Age: 81 y.o. MRN: [id number]  CC: The primary encounter diagnosis was Hypothyroidism due to acquired atrophy of thyroid. Diagnoses of Hyperlipidemia with target LDL less than 100, Other fatigue, Stage 3a chronic kidney disease ([place]), Need for immunization against influenza, and Major depressive disorder, recurrent episode with melancholic features [facility]) were also pertinent to this visit.   HPI Mary Vasquez presents for hypothyroidism and hyperlipidemia Chief Complaint  Patient presents with   Follow-up    6 month follow up    1) fatigue:  feels fatigued despite making allowances for her age.  Has many things she wants to get done around the house,  house cleaning requires frequent rests. Denies back pain and chest pain but gets winded, but feels more achy than usual ,  involves shoulders, some pain of hips (right hip replacement)  left > right.  Does not walk for exercise because  her balance feels off.   No sleep issues:  averages 8 hours .  Diet reviewed:  hearty breakfast,  usually after her exercise class 2/week,  skips lunch,  has dinner at  5  pm .  Eats alone most days 2/week with brothers.  Feels she may be depressed. Despite taking sertraline.  (Did not tolerate wellbutrin due to jitteriness )   Her companion of 10 yrs has now entered memory care after years of home care with aides    Goes to exercise class 2/week,([facility]) with 7 other members ,   hips left shoulder hurts afterward . The class uses 3 lb hands weights,  ad long bands with exercises ghat combine squatting slightly and cross body chopping.  The routine changes to pounding with sticks.  Which does not bother her as much  2) CKD.  Seeing Singh    Outpatient Medications Prior to Visit  Medication Sig Dispense Refill   atorvastatin (LIPITOR) 20 MG tablet TAKE 1 TABLET BY MOUTH EVERY DAY 90 tablet 3   cetirizine (ZYRTEC) 10 MG tablet Take 10 mg by  mouth daily.     Cholecalciferol (VITAMIN D3) 25 MCG (1000 UT) CAPS Take 1,000 Units by mouth 2 (two) times daily.     levothyroxine (SYNTHROID) 25 MCG tablet TAKE 1 TABLET BY MOUTH DAILY BEFORE BREAKFAST EXCEPT ON SUNDAY AND THURSDAY TAKE 2 TABS 114 tablet 1   Probiotic Product (TRUBIOTICS PO) Take by mouth daily.     sertraline (ZOLOFT) 50 MG tablet TAKE 1 TABLET BY MOUTH EVERY DAY 90 tablet 2   Turmeric Curcumin 500 MG CAPS Take by mouth daily.     No facility-administered medications prior to visit.    Review of Systems;  Patient denies headache, fevers, malaise, unintentional weight loss, skin rash, eye pain, sinus congestion and sinus pain, sore throat, dysphagia,  hemoptysis , cough, dyspnea, wheezing, chest pain, palpitations, orthopnea, edema, abdominal pain, nausea, melena, diarrhea, constipation, flank pain, dysuria, hematuria, urinary  Frequency, nocturia, numbness, tingling, seizures,  Focal weakness, Loss of consciousness,  Tremor, insomnia, depression, anxiety, and suicidal ideation.      Objective:  BP 128/84 (BP Location: Left Arm, Patient Position: Sitting, Cuff Size: Normal)   Pulse 63   Temp 97.9 F (36.6 C) (Oral)   Ht _0  (1.549 m)   Wt 154 lb 9.6 oz (70.1 kg)   SpO2 98%   BMI 29.21 kg/m   BP Readings from Last 3 Encounters:  03/30/22 128/84  09/27/21 138/84  08/30/21 122/82    Wt Readings from Last 3 Encounters:  03/30/22 154 lb 9.6 oz (70.1 kg)  09/27/21 157 lb 9.6 oz (71.5 kg)  08/30/21 156 lb 3.2 oz (70.9 kg)    General appearance: alert, cooperative and appears stated age Ears: normal TM's and external ear canals both ears Throat: lips, mucosa, and tongue normal; teeth and gums normal Neck: no adenopathy, no carotid bruit, supple, symmetrical, trachea midline and thyroid not enlarged, symmetric, no tenderness/mass/nodules Back: symmetric, no curvature. ROM normal. No CVA tenderness. Lungs: clear to auscultation bilaterally Heart: regular rate  and rhythm, S1, S2 normal, no murmur, click, rub or gallop Abdomen: soft, non-tender; bowel sounds normal; no masses,  no organomegaly Pulses: 2+ and symmetric Skin: Skin color, texture, turgor normal. No rashes or lesions Lymph nodes: Cervical, supraclavicular, and axillary nodes normal. Neuro:  awake and interactive with normal mood and affect. Higher cortical functions are normal. Speech is clear without word-finding difficulty or dysarthria. Extraocular movements are intact. Visual fields of both eyes are grossly intact. Sensation to light touch is grossly intact bilaterally of upper and lower extremities. Motor examination shows 4+/5 symmetric hand grip and upper extremity and 5/5 lower extremity strength. There is no pronation or drift. Gait is non-ataxic   Lab Results  Component Value Date   HGBA1C 6.0 09/27/2021   HGBA1C 5.9 09/21/2020   HGBA1C 5.8 02/17/2020    Lab Results  Component Value Date   CREATININE 1.08 09/27/2021   CREATININE 1.06 08/30/2021   CREATININE 1.00 03/29/2021    Lab Results  Component Value Date   WBC 5.4 09/27/2021   HGB 12.6 09/27/2021   HCT 37.4 09/27/2021   PLT 175.0 09/27/2021   GLUCOSE 93 09/27/2021   CHOL 160 09/27/2021   TRIG 75.0 09/27/2021   HDL 74.30 09/27/2021   LDLDIRECT 91.0 09/09/2015   LDLCALC 71 09/27/2021   ALT 22 09/27/2021   AST 31 09/27/2021   NA 138 09/27/2021   K 3.6 09/27/2021   CL 103 09/27/2021   CREATININE 1.08 09/27/2021   BUN 15 09/27/2021   CO2 26 09/27/2021   TSH 2.44 09/27/2021   INR 1.11 07/31/2018   HGBA1C 6.0 09/27/2021   MICROALBUR <0.7 09/27/2021    MM 3D SCREEN BREAST BILATERAL  Result Date: 06/07/2021 CLINICAL DATA:  Screening. EXAM: DIGITAL SCREENING BILATERAL MAMMOGRAM WITH TOMOSYNTHESIS AND CAD TECHNIQUE: Bilateral screening digital craniocaudal and mediolateral oblique mammograms were obtained. Bilateral screening digital breast tomosynthesis was performed. The images were evaluated with  computer-aided detection. COMPARISON:  Previous exam(s). ACR Breast Density Category c: The breast tissue is heterogeneously dense, which may obscure small masses. FINDINGS: There are no findings suspicious for malignancy. IMPRESSION: No mammographic evidence of malignancy. A result letter of this screening mammogram will be mailed directly to the patient. RECOMMENDATION: Screening mammogram in one year. (Code:SM-B-01Y) BI-RADS CATEGORY  1: Negative. Electronically Signed   By: Lajean Manes M.D.   On: 06/07/2021 14:36   Assessment & Plan:   Problem List Items Addressed This Visit     Major depressive disorder, recurrent episode with melancholic features ([place])    Aggravated b y her companions' institutionalization de to advanced dementia.  counselling given.  Continue sertraline for now. She did not tolerate wellbutrin in the past.       Hypothyroidism - Primary   Relevant Orders   TSH   Hyperlipidemia with target LDL less than 100   Relevant Orders   Comp Met (CMET)   Lipid Profile  Direct LDL   Fatigue    Multifactorial,  With grief/depression and deconditioning contributing.   She lost her duaghter unexpectedly 3 years ago and now her companion of ten years has entered a facitlity due to advanced dementia. She has ewonderful relationships with her grandchildren. Screening for deficiencies in progress .  counselling given and advised to consider change in SSRI therapy if all labs are unrevealing.  She did not tolerate wellbutrin in the past.       Relevant Orders   CBC with Differential/Platelet   B12 and Folate Panel   IBC + Ferritin   CKD (chronic kidney disease) stage 3, GFR 30-59 ml/min [facility])    Reviewed Dr Keturah Barre last not Oct 2022,  Explained the rationale behind starting an ARB .  checking renal function and urine for protein       Relevant Orders   Microalbumin / creatinine urine ratio   Other Visit Diagnoses     Need for immunization against influenza       Relevant  Orders   Flu Vaccine QUAD High Dose(Fluad) (Completed)       I spent a total of  31 minutes with this patient in a face to face visit on the date of this encounter reviewing the last office visit with me, most recent visit with cDr. Candiss Norse in October 2022,   patient's diet and exercise habits, home blood pressure  radings, counselling on grief,   and post visit ordering of testing and therapeutics.    Follow-up: Return in about 3 months (around 06/29/2022).   Crecencio Mc, MD

## 2022-03-30 NOTE — Assessment & Plan Note (Signed)
Reviewed Dr Keturah Barre last not Oct 2022,  Explained the rationale behind starting an ARB .  checking renal function and urine for protein

## 2022-03-30 NOTE — Patient Instructions (Signed)
I recommend starting a daily walk to keep your legs strong  Practice standing on one leg several times daily

## 2022-03-30 NOTE — Assessment & Plan Note (Signed)
Aggravated b y her companions' institutionalization de to advanced dementia.  counselling given.  Continue sertraline for now. She did not tolerate wellbutrin in the past.

## 2022-04-01 ENCOUNTER — Encounter: Payer: Self-pay | Admitting: Internal Medicine

## 2022-04-01 ENCOUNTER — Other Ambulatory Visit: Payer: Self-pay | Admitting: Internal Medicine

## 2022-04-01 DIAGNOSIS — E538 Deficiency of other specified B group vitamins: Secondary | ICD-10-CM

## 2022-04-04 ENCOUNTER — Ambulatory Visit (INDEPENDENT_AMBULATORY_CARE_PROVIDER_SITE_OTHER): Payer: PPO

## 2022-04-04 ENCOUNTER — Ambulatory Visit: Payer: PPO

## 2022-04-04 DIAGNOSIS — E538 Deficiency of other specified B group vitamins: Secondary | ICD-10-CM

## 2022-04-04 MED ORDER — CYANOCOBALAMIN 1000 MCG/ML IJ SOLN
1000.0000 ug | Freq: Once | INTRAMUSCULAR | Status: AC
  Administered 2022-04-04: 1000 ug via INTRAMUSCULAR

## 2022-04-04 NOTE — Progress Notes (Signed)
Pt arrived for B12 injection, given in R deltoid. Pt tolerated injection well, showed no signs of distress nor voiced any concerns.  

## 2022-04-11 ENCOUNTER — Ambulatory Visit (INDEPENDENT_AMBULATORY_CARE_PROVIDER_SITE_OTHER): Payer: PPO

## 2022-04-11 DIAGNOSIS — E538 Deficiency of other specified B group vitamins: Secondary | ICD-10-CM | POA: Diagnosis not present

## 2022-04-11 MED ORDER — CYANOCOBALAMIN 1000 MCG/ML IJ SOLN
1000.0000 ug | Freq: Once | INTRAMUSCULAR | Status: AC
  Administered 2022-04-11: 1000 ug via INTRAMUSCULAR

## 2022-04-11 NOTE — Progress Notes (Signed)
Patient arrived for B12 injection given B12 injection in left deltoid patient tolerated well nor voiced any signs of distress

## 2022-04-17 DIAGNOSIS — N1831 Chronic kidney disease, stage 3a: Secondary | ICD-10-CM | POA: Diagnosis not present

## 2022-04-18 ENCOUNTER — Ambulatory Visit (INDEPENDENT_AMBULATORY_CARE_PROVIDER_SITE_OTHER): Payer: PPO | Admitting: *Deleted

## 2022-04-18 DIAGNOSIS — E538 Deficiency of other specified B group vitamins: Secondary | ICD-10-CM | POA: Diagnosis not present

## 2022-04-18 MED ORDER — CYANOCOBALAMIN 1000 MCG/ML IJ SOLN
1000.0000 ug | Freq: Once | INTRAMUSCULAR | Status: AC
  Administered 2022-04-18: 1000 ug via INTRAMUSCULAR

## 2022-04-18 NOTE — Progress Notes (Signed)
Pt received B12 injection (3rd weekly) in right deltoid & pt tolerated it well with no complaints. After reviewing notes, I noticed that patient needed labs. So I also drew an Intrinsic factor & told patient we will let her know how she is to continue with her B12 once we received the results.

## 2022-04-20 LAB — INTRINSIC FACTOR ANTIBODIES: Intrinsic Factor: NEGATIVE

## 2022-04-24 DIAGNOSIS — R600 Localized edema: Secondary | ICD-10-CM | POA: Diagnosis not present

## 2022-04-24 DIAGNOSIS — N2581 Secondary hyperparathyroidism of renal origin: Secondary | ICD-10-CM | POA: Diagnosis not present

## 2022-04-24 DIAGNOSIS — N1831 Chronic kidney disease, stage 3a: Secondary | ICD-10-CM | POA: Diagnosis not present

## 2022-06-11 ENCOUNTER — Ambulatory Visit
Admission: RE | Admit: 2022-06-11 | Discharge: 2022-06-11 | Disposition: A | Discharge status: Home / Self Care | Payer: PPO | Source: Ambulatory Visit | Attending: Internal Medicine | Admitting: Internal Medicine

## 2022-06-11 DIAGNOSIS — Z1231 Encounter for screening mammogram for malignant neoplasm of breast: Secondary | ICD-10-CM | POA: Diagnosis not present

## 2022-06-20 ENCOUNTER — Other Ambulatory Visit: Payer: Self-pay | Admitting: Internal Medicine

## 2022-07-02 ENCOUNTER — Other Ambulatory Visit: Payer: Self-pay | Admitting: Internal Medicine

## 2022-07-04 ENCOUNTER — Encounter: Payer: Self-pay | Admitting: Internal Medicine

## 2022-07-04 ENCOUNTER — Ambulatory Visit (INDEPENDENT_AMBULATORY_CARE_PROVIDER_SITE_OTHER): Payer: PPO | Admitting: Internal Medicine

## 2022-07-04 VITALS — BP 148/82 | HR 52 | Temp 98.2°F | Ht 61.0 in | Wt 154.8 lb

## 2022-07-04 DIAGNOSIS — N1831 Chronic kidney disease, stage 3a: Secondary | ICD-10-CM

## 2022-07-04 DIAGNOSIS — R5383 Other fatigue: Secondary | ICD-10-CM | POA: Diagnosis not present

## 2022-07-04 DIAGNOSIS — F339 Major depressive disorder, recurrent, unspecified: Secondary | ICD-10-CM

## 2022-07-04 DIAGNOSIS — E034 Atrophy of thyroid (acquired): Secondary | ICD-10-CM | POA: Diagnosis not present

## 2022-07-04 DIAGNOSIS — R030 Elevated blood-pressure reading, without diagnosis of hypertension: Secondary | ICD-10-CM | POA: Insufficient documentation

## 2022-07-04 DIAGNOSIS — J069 Acute upper respiratory infection, unspecified: Secondary | ICD-10-CM | POA: Diagnosis not present

## 2022-07-04 DIAGNOSIS — E538 Deficiency of other specified B group vitamins: Secondary | ICD-10-CM | POA: Diagnosis not present

## 2022-07-04 MED ORDER — FUROSEMIDE 20 MG PO TABS: 20.0000 mg | ORAL_TABLET | Freq: Every day | ORAL | 3 refills | Status: DC

## 2022-07-04 NOTE — Patient Instructions (Addendum)
  Increase your B12 dose to 2000 mcg daily   Your blood pressure (148/82)  is still  elevated and may be due to your recent increased salt intake.   Increase your water intake and reduce your salt intake  Use the furosemide not more than every other day   Check your BP at home starting next week, not more than ince daily and return for a nurse visit and lab visit in  2 weeks AND BRING YOUR HOME MACHINE AND YOUR HOME READINGS TO YOUR VISIT

## 2022-07-04 NOTE — Assessment & Plan Note (Signed)
Reviewed Dr Doristine Church last note Sept 2023.  She is not taking an ARB

## 2022-07-04 NOTE — Assessment & Plan Note (Signed)
Multifactorial,  With grief/depression and deconditioning contributing.   She lost her duaghter unexpectedly 3 years ago and now her companion of ten years has entered a facitlity due to advanced dementia. She has wonderful relationships with her grandchildren. Screening for deficiencies was done in September.  Encouraged to begin a regular walking program TO IMPROVE CONDITIONING

## 2022-07-04 NOTE — Progress Notes (Signed)
Subjective:  Patient ID: Mary Vasquez, female    DOB: 1940/10/29  Age: 81 y.o. MRN: [id number]  CC: The primary encounter diagnosis was Elevated blood pressure reading in office without diagnosis of hypertension. Diagnoses of Viral URI with cough, Major depressive disorder, recurrent episode with melancholic features (HCC), Other fatigue, Hypothyroidism due to acquired atrophy of thyroid, B12 deficiency, and Stage 3a chronic kidney disease (HCC) were also pertinent to this visit.   HPI Mary [place] presents for ollow up on chronic issues  Chief Complaint  Patient presents with   Medical Management of Chronic Issues    3 month follow up     B12 deficiency:  IF ab negative  she has been taking 1000 mcg daily since mid October, following completion of  4 weekly i IM injections with improvement in insomnia and fatigue.   Prolonged grief:  this is her 3rd christmas since she lost her daughter. She states that she spent the holiday with her 3 grandchildren, all adults, and they were finally able to enjoy the holiday . Marland Kitchen    Fatigue: she denies chest pain and dyspnea,  but feels exhausted by recent travel and hosting of daughter's family .  Sleeping well.   Elevated blood pressure: new,  high reading today,  no history of HTN.  Ate a lot of country ham this weekend, does not use NSAIDS due to history of stage 3 CKD> . Notes tht her chronic  LE  edema is  a little worse .  Has not been drinking as much water lately over the holidays/   CKD:  reviewed last noted Oct 2023 BP was 118/74, cr 1.19  GFR 46 .  Vitamin D dose increased to 2000 Ius daily    Outpatient Medications Prior to Visit  Medication Sig Dispense Refill   atorvastatin (LIPITOR) 20 MG tablet TAKE 1 TABLET BY MOUTH EVERY DAY 90 tablet 3   cetirizine (ZYRTEC) 10 MG tablet Take 10 mg by mouth daily.     Cholecalciferol (VITAMIN D3) 25 MCG (1000 UT) CAPS Take 1,000 Units by mouth 2 (two) times daily.     cyanocobalamin (VITAMIN B12)  1000 MCG tablet Take 1,000 mcg by mouth 2 (two) times daily.     levothyroxine (SYNTHROID) 25 MCG tablet TAKE 1 TABLET BY MOUTH DAILY BEFORE BREAKFAST EXCEPT ON SUNDAY AND THURSDAY TAKE 2 TABS 114 tablet 1   Probiotic Product (TRUBIOTICS PO) Take by mouth daily.     sertraline (ZOLOFT) 50 MG tablet TAKE 1 TABLET BY MOUTH EVERY DAY 90 tablet 2   Turmeric Curcumin 500 MG CAPS Take by mouth daily.     No facility-administered medications prior to visit.    Review of Systems;  Patient denies headache, fevers, malaise, unintentional weight loss, skin rash, eye pain, sinus congestion and sinus pain, sore throat, dysphagia,  hemoptysis , cough, dyspnea, wheezing, chest pain, palpitations, orthopnea, edema, abdominal pain, nausea, melena, diarrhea, constipation, flank pain, dysuria, hematuria, urinary  Frequency, nocturia, numbness, tingling, seizures,  Focal weakness, Loss of consciousness,  Tremor, insomnia, depression, anxiety, and suicidal ideation.      Objective:  BP (!) 148/82   Pulse (!) 52   Temp 98.2 F (36.8 C) (Oral)   Ht 5\' 1"  (1.549 m)   Wt 154 lb 12.8 oz (70.2 kg)   SpO2 97%   BMI 29.25 kg/m   BP Readings from Last 3 Encounters:  07/04/22 (!) 148/82  03/30/22 128/84  09/27/21 138/84  Wt Readings from Last 3 Encounters:  07/04/22 154 lb 12.8 oz (70.2 kg)  03/30/22 154 lb 9.6 oz (70.1 kg)  09/27/21 157 lb 9.6 oz (71.5 kg)    Physical Exam Vitals reviewed.  Constitutional:      General: She is not in acute distress.    Appearance: Normal appearance. She is normal weight. She is not ill-appearing, toxic-appearing or diaphoretic.  HENT:     Head: Normocephalic.  Eyes:     General: No scleral icterus.       Right eye: No discharge.        Left eye: No discharge.     Conjunctiva/sclera: Conjunctivae normal.  Cardiovascular:     Rate and Rhythm: Normal rate and regular rhythm.     Heart sounds: Normal heart sounds.  Pulmonary:     Effort: Pulmonary effort is  normal. No respiratory distress.     Breath sounds: Normal breath sounds.  Abdominal:     General: Bowel sounds are normal. There is no distension.  Musculoskeletal:        General: Normal range of motion.     Right lower leg: Edema present.     Left lower leg: Edema present.  Skin:    General: Skin is warm and dry.  Neurological:     General: No focal deficit present.     Mental Status: She is alert and oriented to person, place, and time. Mental status is at baseline.  Psychiatric:        Mood and Affect: Mood normal.        Behavior: Behavior normal.        Thought Content: Thought content normal.        Judgment: Judgment normal.     Lab Results  Component Value Date   HGBA1C 6.0 09/27/2021   HGBA1C 5.9 09/21/2020   HGBA1C 5.8 02/17/2020    Lab Results  Component Value Date   CREATININE 1.08 03/30/2022   CREATININE 1.08 09/27/2021   CREATININE 1.06 08/30/2021    Lab Results  Component Value Date   WBC 4.1 03/30/2022   HGB 12.9 03/30/2022   HCT 38.1 03/30/2022   PLT 178.0 03/30/2022   GLUCOSE 95 03/30/2022   CHOL 178 03/30/2022   TRIG 92.0 03/30/2022   HDL 69.80 03/30/2022   LDLDIRECT 92.0 03/30/2022   LDLCALC 90 03/30/2022   ALT 17 03/30/2022   AST 28 03/30/2022   NA 138 03/30/2022   K 4.1 03/30/2022   CL 103 03/30/2022   CREATININE 1.08 03/30/2022   BUN 20 03/30/2022   CO2 25 03/30/2022   TSH 1.70 03/30/2022   INR 1.11 07/31/2018   HGBA1C 6.0 09/27/2021   MICROALBUR <0.7 03/30/2022    MM 3D SCREEN BREAST BILATERAL  Result Date: 06/12/2022 CLINICAL DATA:  Screening. EXAM: DIGITAL SCREENING BILATERAL MAMMOGRAM WITH TOMOSYNTHESIS AND CAD TECHNIQUE: Bilateral screening digital craniocaudal and mediolateral oblique mammograms were obtained. Bilateral screening digital breast tomosynthesis was performed. The images were evaluated with computer-aided detection. COMPARISON:  Previous exam(s). ACR Breast Density Category c: The breast tissue is  heterogeneously dense, which may obscure small masses. FINDINGS: There are no findings suspicious for malignancy. IMPRESSION: No mammographic evidence of malignancy. A result letter of this screening mammogram will be mailed directly to the patient. RECOMMENDATION: Screening mammogram in one year. (Code:SM-B-01Y) BI-RADS CATEGORY  1: Negative. Electronically Signed   By: Harmon Pier M.D.   On: 06/12/2022 13:42    Assessment & Plan:  .Elevated blood pressure  reading in office without diagnosis of hypertension Assessment & Plan: Improved but still elevated on repeat.  High salt intake over the holiday weekend likely to blame, with edema noted on exam.  Furosemide 20 mg prn edema (every other day),  RTC 2 weeks with BP readings   Orders: -     Basic metabolic panel; Future  Viral URI with cough -     SARS-COV-2 IgG; Future -     RSV(respiratory syncytial virus) ab, bld; Future  Major depressive disorder, recurrent episode with melancholic features [facility]) Assessment & Plan: Aggravated by loneliness,  due to 1) loss of daughter and 2) her companions' institutionalization due to advanced dementia.  Feeling better currently .  Continue sertraline for now. She did not tolerate wellbutrin in the past.    Other fatigue Assessment & Plan: Multifactorial,  With grief/depression and deconditioning contributing.   She lost her duaghter unexpectedly 3 years ago and now her companion of ten years has entered a facitlity due to advanced dementia. She has wonderful relationships with her grandchildren. Screening for deficiencies was done in September.  Encouraged to begin a regular walking program TO IMPROVE CONDITIONING    Hypothyroidism due to acquired atrophy of thyroid Assessment & Plan: Thyroid function was  WNL in September on 25 mcg dose.  No current changes needed.    Lab Results  Component Value Date   TSH 1.70 03/30/2022      B12 deficiency Assessment & Plan: Increase daily oral  supplement to 2000 mcg daily    Stage 3a chronic kidney disease [facility]) Assessment & Plan: Reviewed Dr Doristine Church last note Sept 2023.  She is not taking an ARB    Other orders -     Furosemide; Take 1 tablet (20 mg total) by mouth daily. As needed for fluid retention ,  Dispense: 45 tablet; Refill: 3     I provided 30 minutes of face-to-face time during this encounter reviewing patient's last visit with me, patient's  most recent visit with nephrology, recent surgical and non surgical procedures, previous  labs and imaging studies, counseling on currently addressed issues,  and post visit ordering to diagnostics and therapeutics .   Follow-up: Return in about 2 weeks (around 07/18/2022).   Sherlene Shams, MD

## 2022-07-04 NOTE — Assessment & Plan Note (Signed)
Aggravated by loneliness,  due to 1) loss of daughter and 2) her companions' institutionalization due to advanced dementia.  Feeling better currently .  Continue sertraline for now. She did not tolerate wellbutrin in the past.

## 2022-07-04 NOTE — Assessment & Plan Note (Signed)
Increase daily oral supplement to 2000 mcg daily

## 2022-07-04 NOTE — Assessment & Plan Note (Signed)
Thyroid function was  WNL in September on 25 mcg dose.  No current changes needed.    Lab Results  Component Value Date   TSH 1.70 03/30/2022

## 2022-07-04 NOTE — Assessment & Plan Note (Signed)
Improved but still elevated on repeat.  High salt intake over the holiday weekend likely to blame, with edema noted on exam.  Furosemide 20 mg prn edema (every other day),  RTC 2 weeks with BP readings

## 2022-07-19 ENCOUNTER — Ambulatory Visit (INDEPENDENT_AMBULATORY_CARE_PROVIDER_SITE_OTHER): Payer: PPO

## 2022-07-19 DIAGNOSIS — R030 Elevated blood-pressure reading, without diagnosis of hypertension: Secondary | ICD-10-CM | POA: Diagnosis not present

## 2022-07-19 NOTE — Progress Notes (Cosign Needed Addendum)
Patient here for nurse visit BP check per order from Dr. Derrel Nip.   Patient reports compliance with prescribed BP medications: yes, she is on Lasix taking every other day.    Last dose of BP medication: Yesterday  BP Readings from Last 3 Encounters:  07/19/22 (!) 145/76  07/04/22 (!) 148/82  03/30/22 128/84   Pulse Readings from Last 3 Encounters:  07/19/22 (!) 56  07/04/22 (!) 52  03/30/22 63   Patient came in with her own BP Monitor and she took her BP and it was 143/73 with a pulse of 62.   Per Dr. Zettie Pho Patient was not symptomatic and I gave Janett Billow her recent BP readings, Patient was informed to Monitor her BP for 5 days once a month until her appointment in April and she understood.  Patient was informed if she has any headaches/chest pain or any unusual swelling to seek medical attention.    Patient verbalized understanding of instructions.   Gordy Councilman, CMA   I have reviewed the above information and agree with above.   Deborra Medina, MD

## 2022-08-02 ENCOUNTER — Telehealth: Payer: Self-pay | Admitting: Internal Medicine

## 2022-08-02 NOTE — Telephone Encounter (Signed)
Left message for patient to call back and schedule Medicare Annual Wellness Visit (AWV).   Please offer to do by telephone.  Left office number and my jabber [phone].  Last AWV:08/02/2021   Please schedule at anytime with Nurse Health Advisor.

## 2022-08-16 DIAGNOSIS — H43813 Vitreous degeneration, bilateral: Secondary | ICD-10-CM | POA: Diagnosis not present

## 2022-08-16 DIAGNOSIS — H26493 Other secondary cataract, bilateral: Secondary | ICD-10-CM | POA: Diagnosis not present

## 2022-08-16 DIAGNOSIS — Z961 Presence of intraocular lens: Secondary | ICD-10-CM | POA: Diagnosis not present

## 2022-08-16 DIAGNOSIS — H04123 Dry eye syndrome of bilateral lacrimal glands: Secondary | ICD-10-CM | POA: Diagnosis not present

## 2022-08-21 ENCOUNTER — Telehealth: Payer: Self-pay | Admitting: Internal Medicine

## 2022-08-21 NOTE — Telephone Encounter (Signed)
Left message for patient to call back and schedule Medicare Annual Wellness Visit (AWV).   Please offer to do by telephone.  Left office number and my jabber [phone].  Last AWV:08/02/2021   Please schedule at anytime with Nurse Health Advisor.

## 2022-09-12 ENCOUNTER — Telehealth: Payer: Self-pay | Admitting: Internal Medicine

## 2022-09-12 NOTE — Telephone Encounter (Signed)
Copied from [place] [id number]. Topic: Medicare AWV >> Sep 12, 2022 12:19 PM Lollie Marrow wrote: Reason for CRM: Called patient to schedule Medicare Annual Wellness Visit (AWV). Left message for patient to call back and schedule Medicare Annual Wellness Visit (AWV).  Last date of AWV: 08/02/2021  Please schedule an appointment at any time with Denisa, [place].  If any questions, please contact me at [phone].    Thank you,  [place] Direct dial  [phone]

## 2022-10-18 ENCOUNTER — Ambulatory Visit (INDEPENDENT_AMBULATORY_CARE_PROVIDER_SITE_OTHER): Payer: PPO | Admitting: Internal Medicine

## 2022-10-18 ENCOUNTER — Encounter: Payer: Self-pay | Admitting: Internal Medicine

## 2022-10-18 VITALS — BP 128/71 | HR 55 | Temp 98.7°F | Ht 61.0 in | Wt 156.6 lb

## 2022-10-18 DIAGNOSIS — M8580 Other specified disorders of bone density and structure, unspecified site: Secondary | ICD-10-CM | POA: Diagnosis not present

## 2022-10-18 DIAGNOSIS — E034 Atrophy of thyroid (acquired): Secondary | ICD-10-CM

## 2022-10-18 DIAGNOSIS — Z0000 Encounter for general adult medical examination without abnormal findings: Secondary | ICD-10-CM | POA: Diagnosis not present

## 2022-10-18 DIAGNOSIS — R030 Elevated blood-pressure reading, without diagnosis of hypertension: Secondary | ICD-10-CM

## 2022-10-18 DIAGNOSIS — E785 Hyperlipidemia, unspecified: Secondary | ICD-10-CM

## 2022-10-18 DIAGNOSIS — N1831 Chronic kidney disease, stage 3a: Secondary | ICD-10-CM

## 2022-10-18 DIAGNOSIS — Z780 Asymptomatic menopausal state: Secondary | ICD-10-CM

## 2022-10-18 DIAGNOSIS — F339 Major depressive disorder, recurrent, unspecified: Secondary | ICD-10-CM | POA: Diagnosis not present

## 2022-10-18 LAB — TSH: TSH: 2.39 u[IU]/mL (ref 0.35–5.50)

## 2022-10-18 LAB — COMPREHENSIVE METABOLIC PANEL
ALT: 21 U/L (ref 0–35)
AST: 31 U/L (ref 0–37)
Albumin: 4.2 g/dL (ref 3.5–5.2)
Alkaline Phosphatase: 60 U/L (ref 39–117)
BUN: 17 mg/dL (ref 6–23)
CO2: 28 mEq/L (ref 19–32)
Calcium: 9.4 mg/dL (ref 8.4–10.5)
Chloride: 103 mEq/L (ref 96–112)
Creatinine, Ser: 1.08 mg/dL (ref 0.40–1.20)
GFR: 48.35 mL/min — ABNORMAL LOW (ref >60.00)
Glucose, Bld: 98 mg/dL (ref 70–99)
Potassium: 4.2 mEq/L (ref 3.5–5.1)
Sodium: 140 mEq/L (ref 135–145)
Total Bilirubin: 0.6 mg/dL (ref 0.2–1.2)
Total Protein: 6.8 g/dL (ref 6.0–8.3)

## 2022-10-18 MED ORDER — LEVOTHYROXINE SODIUM 25 MCG PO TABS: ORAL_TABLET | ORAL | 1 refills | Status: DC

## 2022-10-18 NOTE — Assessment & Plan Note (Signed)
with atorvastatin 20 mg dose  LDL is at goal .  LFTs are normal.  Lab Results  Component Value Date   CHOL 178 03/30/2022   HDL 69.80 03/30/2022   LDLCALC 90 03/30/2022   LDLDIRECT 92.0 03/30/2022   TRIG 92.0 03/30/2022   CHOLHDL 3 03/30/2022

## 2022-10-18 NOTE — Progress Notes (Signed)
Patient ID: Mary Vas[place], female    DOB: 03/19/1941  Age: 82 y.o. MRN: [id number]  The patient is here for annual preventive examination and management of other chronic and acute problems.   The risk factors are reflected in the social history.  The roster of all physicians providing medical care to patient - is listed in the Snapshot section of the chart.  Activities of daily living:  The patient is 100% independent in all ADLs: dressing, toileting, feeding as well as independent mobility  Home safety : The patient has smoke detectors in the home. They wear seatbelts.  There are no firearms at home. There is no violence in the home.   There is no risks for hepatitis, STDs or HIV. There is no   history of blood transfusion. They have no travel history to infectious disease endemic areas of the world.  The patient has seen their dentist in the last six month. They have seen their eye doctor in the last year. They admit to slight hearing difficulty with regard to whispered voices and some television programs.  They have deferred audiologic testing in the last year.  They do not  have excessive sun exposure. Discussed the need for sun protection: hats, long sleeves and use of sunscreen if there is significant sun exposure.   Diet: the importance of a healthy diet is discussed. They do have a healthy diet.  The benefits of regular aerobic exercise were discussed. She goes to the gym 3 times per week .   Spends hours daily doing crossword puzzles instead of Sudoku.  SHE HAS NOT HAD ANY FALLS. .  Depression screen: there are no signs or vegative symptoms of depression- irritability, change in appetite, anhedonia, sadness/tearfullness.  Cognitive assessment: the patient manages all their financial and personal affairs and is actively engaged. She recently locked herself out of her car because her routine changed .  She problem solved on her own but it upset her for hours.   They could relate  day,date,year and events; recalled 2/3 objects at 3 minutes; performed clock-face test normally.  The following portions of the patient's history were reviewed and updated as appropriate: allergies, current medications, past family history, past medical history,  past surgical history, past social history  and problem list.  Visual acuity was not assessed per patient preference since she has regular follow up with her ophthalmologist. Hearing and body mass index were assessed and reviewed.   During the course of the visit the patient was educated and counseled about appropriate screening and preventive services including : fall prevention , diabetes screening, nutrition counseling, colorectal cancer screening, and recommended immunizations.    CC: The primary encounter diagnosis was Hypothyroidism due to acquired atrophy of thyroid. Diagnoses of Hyperlipidemia with target LDL less than 100, Osteopenia after menopause, Major depressive disorder, recurrent episode with melancholic features, Elevated blood pressure reading in office without diagnosis of hypertension, Stage 3a chronic kidney disease, and Encounter for preventive health examination were also pertinent to this visit.  Grief: Companion of 11 yrs is now in hospice and not expected to last another week.  She is sleeping well, considering a move to be closer to one of her 2 sons ([place] and [place]) .  Her brothers live here in [place]  but all  of their children are elsewhere too.  Taking furosemide every other day  History Mary Vasquez has a past medical history of Arthritis, Depression, Family history of adverse reaction to anesthesia, History of kidney  stones, Hyperlipidemia, Hypertension, Hypothyroidism, and Vertigo.   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   Her family history includes Breast cancer in her cousin and paternal aunt; Cancer in her brother; Diabetes in her brother; Heart disease in her father; Heart disease (age of onset: 29) in her mother.She reports that she has never smoked. She has never used smokeless tobacco. She reports current alcohol use of about 3.0 standard drinks of alcohol per week. She reports that she does not use drugs.  Outpatient Medications Prior to Visit  Medication Sig Dispense Refill  . atorvastatin (LIPITOR) 20 MG tablet TAKE 1 TABLET BY MOUTH EVERY DAY 90 tablet 3  . cetirizine (ZYRTEC) 10 MG tablet Take 10 mg by mouth daily.    . Cholecalciferol (VITAMIN D3) 25 MCG (1000 UT) CAPS Take 1,000 Units by mouth 2 (two) times daily.    . cyanocobalamin (VITAMIN B12) 1000 MCG tablet Take 1,000 mcg by mouth 2 (two) times daily.    . furosemide (LASIX) 20 MG tablet Take 1 tablet (20 mg total) by mouth daily. As needed for fluid retention , 45 tablet 3  . Probiotic Product (TRUBIOTICS PO) Take by mouth daily.    . sertraline (ZOLOFT) 50 MG tablet TAKE 1 TABLET BY MOUTH EVERY DAY 90 tablet 2  . Turmeric Curcumin 500 MG CAPS Take by mouth daily.    Marland Kitchen levothyroxine (SYNTHROID) 25 MCG tablet TAKE 1 TABLET BY MOUTH DAILY BEFORE BREAKFAST EXCEPT ON SUNDAY AND THURSDAY TAKE 2 TABS 114 tablet 1   No facility-administered medications prior to visit.    Review of Systems  Patient denies headache, fevers, malaise, unintentional weight loss, skin rash, eye pain, sinus congestion and sinus pain, sore throat, dysphagia,  hemoptysis , cough, dyspnea, wheezing, chest pain, palpitations, orthopnea, edema, abdominal pain, nausea, melena, diarrhea, constipation, flank pain, dysuria, hematuria, urinary  Frequency, nocturia, numbness, tingling, seizures,  Focal weakness, Loss of consciousness,  Tremor, insomnia, depression, anxiety, and suicidal ideation.      Objective:  BP 128/71   Pulse (!) 55   Temp 98.7 F (37.1 C) (Oral)   Ht 5\' 1"  (1.549 m)   Wt 156 lb 9.6 oz (71 kg)   SpO2 96%   BMI 29.59 kg/m   Physical Exam Vitals reviewed.  Constitutional:      General: She is not in acute distress.    Appearance: Normal appearance. She is well-developed and normal weight. She is not ill-appearing, toxic-appearing or diaphoretic.  HENT:     Head: Normocephalic.     Right Ear: Tympanic membrane, ear canal and external ear normal. There is no impacted cerumen.     Left Ear: Tympanic membrane, ear canal and external ear normal. There is no impacted cerumen.     Nose: Nose normal.     Mouth/Throat:     Mouth: Mucous membranes are moist.     Pharynx: Oropharynx is clear.  Eyes:     General: No scleral icterus.       Right eye: No discharge.        Left eye: No discharge.     Conjunctiva/sclera: Conjunctivae normal.     Pupils: Pupils are equal, round, and reactive to light.  Neck:     Thyroid: No thyromegaly.     Vascular: No carotid bruit or  JVD.  Cardiovascular:     Rate and Rhythm: Normal rate and regular rhythm.     Heart sounds: Normal heart sounds.  Pulmonary:     Effort: Pulmonary effort is normal. No respiratory distress.     Breath sounds: Normal breath sounds.  Chest:  Breasts:    Breasts are symmetrical.     Right: Normal. No swelling, inverted nipple, mass, nipple discharge, skin change or tenderness.     Left: Normal. No swelling, inverted nipple, mass, nipple discharge, skin change or tenderness.  Abdominal:     General: Bowel sounds are normal.     Palpations: Abdomen is soft. There is no mass.     Tenderness: There is no abdominal tenderness. There is no guarding or rebound.  Musculoskeletal:        General: Normal range of motion.     Cervical back: Normal range of motion and neck supple.  Lymphadenopathy:     Cervical: No cervical adenopathy.     Upper Body:     Right upper body: No supraclavicular,  axillary or pectoral adenopathy.     Left upper body: No supraclavicular, axillary or pectoral adenopathy.  Skin:    General: Skin is warm and dry.  Neurological:     General: No focal deficit present.     Mental Status: She is alert and oriented to person, place, and time. Mental status is at baseline.  Psychiatric:        Mood and Affect: Mood normal.        Behavior: Behavior normal.        Thought Content: Thought content normal.        Judgment: Judgment normal.   Assessment & Plan:  Hypothyroidism due to acquired atrophy of thyroid Assessment & Plan: Currently taking 1 25 mcg 5 days per week and 50 mcg  2 das per week for total weekly dose of 225 mcg   Orders: -     TSH  Hyperlipidemia with target LDL less than 100 Assessment & Plan:  with atorvastatin 20 mg dose  LDL is at goal .  LFTs are normal.  Lab Results  Component Value Date   CHOL 178 03/30/2022   HDL 69.80 03/30/2022   LDLCALC 90 03/30/2022   LDLDIRECT 92.0 03/30/2022   TRIG 92.0 03/30/2022   CHOLHDL 3 03/30/2022     Orders: -     Comprehensive metabolic panel  Osteopenia after menopause Assessment & Plan: 2021 last DEXA osteopenia .  Will repeat in 2026    Major depressive disorder, recurrent episode with melancholic features Assessment & Plan: Symptoms improved despite 1) loss of daughter and 2) her companions' impending death due to advanced dementia.   Continue sertraline for now. She did not tolerate wellbutrin in the past.    Elevated blood pressure reading in office without diagnosis of hypertension Assessment & Plan: Home readings have ranged from 105 systolic to 137.  No medications indicated given her age   Stage 3a chronic kidney disease Assessment & Plan: Reviewed Dr Doristine ChurchSingh's last note Sept 2023.  She is not taking an ARB and using diuretic every other day .  Cr to be repeated today    Encounter for preventive health examination Assessment & Plan: age appropriate education and  counseling updated, referrals for preventative services and immunizations addressed, dietary and smoking counseling addressed, most recent labs reviewed.  I have personally reviewed and have noted:   1) the patient's medical and social history 2) The pt's  use of alcohol, tobacco, and illicit drugs 3) The patient's current medications and supplements 4) Functional ability including ADL's, fall risk, home safety risk, hearing and visual impairment 5) Diet and physical activities 6) Evidence for depression or mood disorder 7) The patient's height, weight, and BMI have been recorded in the chart  I have made referrals, and provided counseling and education based on review of the above    Other orders -     Levothyroxine Sodium; TAKE 1 TABLET BY MOUTH DAILY BEFORE BREAKFAST EXCEPT ON SUNDAY AND THURSDAY TAKE 2 TABS  Dispense: 114 tablet; Refill: 1      I provided 40 minutes of  face-to-face time during this encounter reviewing patient's current problems and past surgeries,  recent labs and imaging studies, providing counseling on the above mentioned problems , and coordination  of care .   Follow-up: Return in about 6 months (around 04/19/2023).   Sherlene Shams, MD

## 2022-10-18 NOTE — Assessment & Plan Note (Signed)
Symptoms improved despite 1) loss of daughter and 2) her companions' impending death due to advanced dementia.   Continue sertraline for now. She did not tolerate wellbutrin in the past.

## 2022-10-18 NOTE — Assessment & Plan Note (Signed)
Currently taking 1 25 mcg 5 days per week and 50 mcg  2 das per week for total weekly dose of 225 mcg

## 2022-10-18 NOTE — Assessment & Plan Note (Signed)
Reviewed Dr Doristine Church last note Sept 2023.  She is not taking an ARB and using diuretic every other day .  Cr to be repeated today

## 2022-10-18 NOTE — Assessment & Plan Note (Signed)
2021 last DEXA osteopenia .  Will repeat in 2026

## 2022-10-18 NOTE — Patient Instructions (Addendum)
Your Medicare Annual Wellness visit is due, please schedule this appointment at checkout. (This is the appointment with Denisa , not  with Dr Darrick Huntsman)  You can take 1/2 furosemide daily if you prefer rather than 1 tablet every other day

## 2022-10-18 NOTE — Assessment & Plan Note (Signed)

## 2022-10-18 NOTE — Assessment & Plan Note (Signed)
Home readings have ranged from 105 systolic to 137.  No medications indicated given her age

## 2022-11-02 ENCOUNTER — Ambulatory Visit (INDEPENDENT_AMBULATORY_CARE_PROVIDER_SITE_OTHER): Payer: PPO

## 2022-11-02 VITALS — Ht 61.0 in | Wt 156.0 lb

## 2022-11-02 DIAGNOSIS — Z0000 Encounter for general adult medical examination without abnormal findings: Secondary | ICD-10-CM

## 2022-11-02 NOTE — Progress Notes (Cosign Needed)
Subjective:   Mary Vasquez is a 82 y.o. female who presents for Medicare Annual (Subsequent) preventive examination.  Review of Systems    No ROS.  Medicare Wellness Virtual Visit.  Visual/audio telehealth visit, UTA vital signs.   See social history for additional risk factors.   Cardiac Risk Factors include: advanced age (>65men, >47 women)     Objective:    Today's Vitals   11/02/22 1142  Weight: 156 lb (70.8 kg)  Height: 5\' 1"  (1.549 m)   Body mass index is 29.48 kg/m.     11/02/2022   11:46 AM 08/02/2021    8:39 AM 06/09/2020   10:52 AM 12/23/2019    8:13 AM 11/25/2019   11:07 AM 06/09/2019   10:45 AM 01/24/2019   12:07 PM  Advanced Directives  Does Patient Have a Medical Advance Directive? No No No No No Yes No  Type of Advance Directive      Healthcare Power of Attorney   Does patient want to make changes to medical advance directive?      No - Patient declined   Copy of Healthcare Power of Attorney in Chart?      No - copy requested   Would patient like information on creating a medical advance directive? No - Patient declined No - Patient declined No - Patient declined No - Patient declined No - Patient declined  No - Patient declined    Current Medications (verified) Outpatient Encounter Medications as of 11/02/2022  Medication Sig   atorvastatin (LIPITOR) 20 MG tablet TAKE 1 TABLET BY MOUTH EVERY DAY   cetirizine (ZYRTEC) 10 MG tablet Take 10 mg by mouth daily.   Cholecalciferol (VITAMIN D3) 25 MCG (1000 UT) CAPS Take 1,000 Units by mouth 2 (two) times daily.   cyanocobalamin (VITAMIN B12) 1000 MCG tablet Take 1,000 mcg by mouth 2 (two) times daily.   furosemide (LASIX) 20 MG tablet Take 1 tablet (20 mg total) by mouth daily. As needed for fluid retention ,   levothyroxine (SYNTHROID) 25 MCG tablet TAKE 1 TABLET BY MOUTH DAILY BEFORE BREAKFAST EXCEPT ON SUNDAY AND THURSDAY TAKE 2 TABS   Probiotic Product (TRUBIOTICS PO) Take by mouth daily.   sertraline  (ZOLOFT) 50 MG tablet TAKE 1 TABLET BY MOUTH EVERY DAY   Turmeric Curcumin 500 MG CAPS Take by mouth daily.   No facility-administered encounter medications on file as of 11/02/2022.    Allergies (verified) Hydrocodone, Penicillin g, Lexapro [escitalopram], Wellbutrin [bupropion], and Augmentin [amoxicillin-pot clavulanate]   History: Past Medical History:  Diagnosis Date   Arthritis    Depression    situational   Family history of adverse reaction to anesthesia    Mother - altered mental state for a day or 2(age 1)   History of kidney stones    Hyperlipidemia    Hypertension    Hypothyroidism    Vertigo    none for several years   Past Surgical History:  Procedure Laterality Date   BREAST BIOPSY Left 2009   CORE - NEG...marker in breast   BREAST SURGERY  2009   stereotactic,  left,  benign    CATARACT EXTRACTION W/PHACO Left 11/25/2019   Procedure: CATARACT EXTRACTION PHACO AND INTRAOCULAR LENS PLACEMENT (IOC) LEFT 9.58 01:13.8 12.9%;  Surgeon: Lockie Mola, MD;  Location: [facility];  Service: Ophthalmology;  Laterality: Left;   CATARACT EXTRACTION W/PHACO Right 12/23/2019   Procedure: CATARACT EXTRACTION PHACO AND INTRAOCULAR LENS PLACEMENT (IOC) RIGHT 12.74  01:08.4  18.6%;  Surgeon: Lockie Mola, MD;  Location: [facility];  Service: Ophthalmology;  Laterality: Right;   HYSTEROSCOPY  2008   ROTATOR CUFF REPAIR Right 2009   [facility]   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   Procedure: TOTAL HIP ARTHROPLASTY ANTERIOR APPROACH;  Surgeon: Lyndle Herrlich, MD;  Location: [facility];  Service: Orthopedics;  Laterality: Right;   Family History  Problem Relation Age of Onset   Heart disease Mother 83       ami   Heart disease Father        ami   Diabetes Brother    Cancer Brother        prostate ca   Breast cancer Paternal Aunt        50'S   Breast cancer Cousin    Social History   Socioeconomic History   Marital status: Divorced     Spouse name: Not on file   Number of children: Not on file   Years of education: Not on file   Highest education level: Not on file  Occupational History   Occupation: receptionist    Comment: retired  Tobacco Use   Smoking status: Never   Smokeless tobacco: Never  Vaping Use   Vaping Use: Never used  Substance and Sexual Activity   Alcohol use: Yes    Alcohol/week: 3.0 standard drinks of alcohol    Types: 3 Glasses of wine per week   Drug use: No   Sexual activity: Not Currently  Other Topics Concern   Not on file  Social History Narrative   Not on file   Social Determinants of Health   Financial Resource Strain: Low Risk  (08/02/2021)   Overall Financial Resource Strain (CARDIA)    Difficulty of Paying Living Expenses: Not hard at all  Food Insecurity: No Food Insecurity (11/02/2022)   Hunger Vital Sign    Worried About Running Out of Food in the Last Year: Never true    Ran Out of Food in the Last Year: Never true  Transportation Needs: No Transportation Needs (08/02/2021)   PRAPARE - Administrator, Civil Service (Medical): No    Lack of Transportation (Non-Medical): No  Physical Activity: Sufficiently Active (08/02/2021)   Exercise Vital Sign    Days of Exercise per Week: 3 days    Minutes of Exercise per Session: 50 min  Stress: No Stress Concern Present (08/02/2021)   [employer] of Occupational Health - Occupational Stress Questionnaire    Feeling of Stress : Not at all  Social Connections: Unknown (08/02/2021)   Social Connection and Isolation Panel [NHANES]    Frequency of Communication with Friends and Family: More than three times a week    Frequency of Social Gatherings with Friends and Family: More than three times a week    Attends Religious Services: Not on Marketing executive or Organizations: Not on file    Attends Banker Meetings: Not on file    Marital Status: Not on file    Tobacco Counseling Counseling  given: Not Answered   Clinical Intake:  Pre-visit preparation completed: Yes        Diabetes: No  How often do you need to have someone help you when you read instructions, pamphlets, or other written materials from your doctor or pharmacy?: 1 - Never    Interpreter Needed?: No      Activities of Daily Living    11/02/2022  11:45 AM  In your present state of health, do you have any difficulty performing the following activities:  Hearing? 0  Vision? 0  Difficulty concentrating or making decisions? 0  Walking or climbing stairs? 0  Dressing or bathing? 0  Doing errands, shopping? 0  Preparing Food and eating ? N  Using the Toilet? N  In the past six months, have you accidently leaked urine? N  Do you have problems with loss of bowel control? N  Managing your Medications? N  Managing your Finances? N  Housekeeping or managing your Housekeeping? N    Patient Care Team: Sherlene Shams, MD as PCP - General (Internal Medicine)  Indicate any recent Medical Services you may have received from other than [facility] providers in the past year (date may be approximate).     Assessment:   This is a routine wellness examination for Makinzey.  I connected with  Araeya C Mizer on 11/02/22 by a audio enabled telemedicine application and verified that I am speaking with the correct person using two identifiers.  Patient Location: Home  Provider Location: Office/Clinic  I discussed the limitations of evaluation and management by telemedicine. The patient expressed understanding and agreed to proceed.   Hearing/Vision screen Hearing Screening - Comments:: Patient is able to hear conversational tones without difficulty.  No issues reported.   Vision Screening - Comments:: Followed by [facility], Dr. Inez Pilgrim Wears glasses    Dietary issues and exercise activities discussed: Current Exercise Habits: Home exercise routine, Type of exercise:  calisthenics;stretching;strength training/weights (Silver sneaker program), Time (Minutes): 45, Frequency (Times/Week): 2, Weekly Exercise (Minutes/Week): 90, Intensity: Mild   Goals Addressed               This Visit's Progress     Patient Stated     Weight (lb) < 140 lb (63.5 kg) (pt-stated)   156 lb (70.8 kg)     Intermittent fasting. Stay active. Stay hydrated.        Depression Screen    11/02/2022   11:48 AM 10/18/2022    9:32 AM 07/04/2022    8:43 AM 03/30/2022    9:14 AM 03/30/2022    9:11 AM 09/27/2021   10:50 AM 08/30/2021    1:08 PM  PHQ 2/9 Scores  PHQ - 2 Score 2 2 1  0 0 0 0  PHQ- 9 Score 4 4 2 3   0 0    Fall Risk    11/02/2022   11:46 AM 10/18/2022    9:32 AM 07/04/2022    8:43 AM 03/30/2022    9:11 AM 09/27/2021   10:50 AM  Fall Risk   Falls in the past year? 0 0 0 0 0  Number falls in past yr: 0 0  0   Injury with Fall?  0  0   Risk for fall due to :  No Fall Risks No Fall Risks No Fall Risks No Fall Risks  Follow up Falls evaluation completed;Falls prevention discussed Falls evaluation completed Falls evaluation completed Falls evaluation completed Falls evaluation completed    FALL RISK PREVENTION PERTAINING TO THE HOME: Home free of loose throw rugs in walkways, pet beds, electrical cords, etc? Yes  Adequate lighting in your home to reduce risk of falls? Yes   ASSISTIVE DEVICES UTILIZED TO PREVENT FALLS: Life alert? No  Use of a cane, walker or w/c? No Grab bars in the bathroom? No  Shower chair or bench in shower? No  Elevated toilet seat  or a handicapped toilet? No   TIMED UP AND GO: Was the test performed? No .    Cognitive Function: 6CIT/MMSE declined. Patient is alert and oriented x3.     11/02/2022   11:43 AM 11/28/2015    2:12 PM  MMSE - Mini Mental State Exam  Not completed: Unable to complete   Orientation to time  5  Orientation to Place  5  Registration  3  Attention/ Calculation  5  Recall  3  Language- name 2 objects   2  Language- repeat  1  Language- follow 3 step command  3  Language- read & follow direction  1  Write a sentence  1  Copy design  1  Total score  30        08/02/2021    8:45 AM 06/09/2020   11:00 AM 06/09/2019   10:57 AM  6CIT Screen  What Year? 0 points 0 points 0 points  What month? 0 points 0 points 0 points  What time? 0 points 0 points 0 points  Count back from 20 0 points 0 points 0 points  Months in reverse 0 points 0 points 0 points  Repeat phrase 0 points  0 points  Total Score 0 points  0 points    Immunizations Immunization History  Administered Date(s) Administered   Fluad Quad(high Dose 65+) 04/15/2019, 04/10/2021, 03/30/2022   Influenza Nasal 04/11/2015   Influenza, High Dose Seasonal PF 05/28/2016, 06/02/2018, 05/24/2020, 05/24/2020   Influenza-Unspecified 04/20/2013, 04/16/2014   PFIZER(Purple Top)SARS-COV-2 Vaccination 07/30/2019, 08/20/2019, 05/24/2020   Pneumococcal Conjugate-13 04/23/2013   Pneumococcal Polysaccharide-23 04/23/2006, 06/14/2014   Tdap 07/09/2009, 03/22/2021   Zoster Recombinat (Shingrix) 03/22/2021, 07/17/2021   Screening Tests Health Maintenance  Topic Date Due   COVID-19 Vaccine (4 - 2023-24 season) 11/03/2022 (Originally 03/09/2022)   INFLUENZA VACCINE  02/07/2023   MAMMOGRAM  06/12/2023   Medicare Annual Wellness (AWV)  11/02/2023   DTaP/Tdap/Td (3 - Td or Tdap) 03/23/2031   Pneumonia Vaccine 27+ Years old  Completed   DEXA SCAN  Completed   Zoster Vaccines- Shingrix  Completed   HPV VACCINES  Aged Out    Health Maintenance There are no preventive care reminders to display for this patient.  Lung Cancer Screening: (Low Dose CT Chest recommended if Age 39-80 years, 30 pack-year currently smoking OR have quit w/in 15years.) does not qualify.   Hepatitis C Screening: does not qualify  Vision Screening: Recommended annual ophthalmology exams for early detection of glaucoma and other disorders of the eye.    Dental  Screening: Recommended annual dental exams for proper oral hygiene  Community Resource Referral / Chronic Care Management: CRR required this visit?  No   CCM required this visit?  No      Plan:     I have personally reviewed and noted the following in the patient's chart:   Medical and social history Use of alcohol, tobacco or illicit drugs  Current medications and supplements including opioid prescriptions. Patient is not currently taking opioid prescriptions. Functional ability and status Nutritional status Physical activity Advanced directives List of other physicians Hospitalizations, surgeries, and ER visits in previous 12 months Vitals Screenings to include cognitive, depression, and falls Referrals and appointments  In addition, I have reviewed and discussed with patient certain preventive protocols, quality metrics, and best practice recommendations. A written personalized care plan for preventive services as well as general preventive health recommendations were provided to patient.     Thedora Rings L Motley,  LPN   1/61/0960     I have reviewed the above information and agree with above.   Duncan Dull, MD

## 2022-11-02 NOTE — Patient Instructions (Addendum)
Mary Vasquez , Thank you for taking time to come for your Medicare Wellness Visit. I appreciate your ongoing commitment to your health goals. Please review the following plan we discussed and let me know if I can assist you in the future.   These are the goals we discussed:  Goals       Patient Stated     Weight (lb) < 140 lb (63.5 kg) (pt-stated)      Intermittent fasting. Stay active. Stay hydrated.         This is a list of the screening recommended for you and due dates:  Health Maintenance  Topic Date Due   COVID-19 Vaccine (4 - 2023-24 season) 11/03/2022*   Flu Shot  02/07/2023   Mammogram  06/12/2023   Medicare Annual Wellness Visit  11/02/2023   DTaP/Tdap/Td vaccine (3 - Td or Tdap) 03/23/2031   Pneumonia Vaccine  Completed   DEXA scan (bone density measurement)  Completed   Zoster (Shingles) Vaccine  Completed   HPV Vaccine  Aged Out  *Topic was postponed. The date shown is not the original due date.    Advanced directives: End of life planning; Advance aging; Advanced directives discussed.  Copy of current HCPOA/Living Will requested.    Conditions/risks identified: none new  Next appointment: Follow up in one year for your annual wellness visit    Preventive Care 65 Years and Older, Female Preventive care refers to lifestyle choices and visits with your health care provider that can promote health and wellness. What does preventive care include? A yearly physical exam. This is also called an annual well check. Dental exams once or twice a year. Routine eye exams. Ask your health care provider how often you should have your eyes checked. Personal lifestyle choices, including: Daily care of your teeth and gums. Regular physical activity. Eating a healthy diet. Avoiding tobacco and drug use. Limiting alcohol use. Practicing safe sex. Taking low-dose aspirin every day. Taking vitamin and mineral supplements as recommended by your health care provider. What  happens during an annual well check? The services and screenings done by your health care provider during your annual well check will depend on your age, overall health, lifestyle risk factors, and family history of disease. Counseling  Your health care provider may ask you questions about your: Alcohol use. Tobacco use. Drug use. Emotional well-being. Home and relationship well-being. Sexual activity. Eating habits. History of falls. Memory and ability to understand (cognition). Work and work Astronomer. Reproductive health. Screening  You may have the following tests or measurements: Height, weight, and BMI. Blood pressure. Lipid and cholesterol levels. These may be checked every 5 years, or more frequently if you are over 51 years old. Skin check. Lung cancer screening. You may have this screening every year starting at age 72 if you have a 30-pack-year history of smoking and currently smoke or have quit within the past 15 years. Fecal occult blood test (FOBT) of the stool. You may have this test every year starting at age 4. Flexible sigmoidoscopy or colonoscopy. You may have a sigmoidoscopy every 5 years or a colonoscopy every 10 years starting at age 46. Hepatitis C blood test. Hepatitis B blood test. Sexually transmitted disease (STD) testing. Diabetes screening. This is done by checking your blood sugar (glucose) after you have not eaten for a while (fasting). You may have this done every 1-3 years. Bone density scan. This is done to screen for osteoporosis. You may have this done starting at  age 69. Mammogram. This may be done every 1-2 years. Talk to your health care provider about how often you should have regular mammograms. Talk with your health care provider about your test results, treatment options, and if necessary, the need for more tests. Vaccines  Your health care provider may recommend certain vaccines, such as: Influenza vaccine. This is recommended every  year. Tetanus, diphtheria, and acellular pertussis (Tdap, Td) vaccine. You may need a Td booster every 10 years. Zoster vaccine. You may need this after age 57. Pneumococcal 13-valent conjugate (PCV13) vaccine. One dose is recommended after age 33. Pneumococcal polysaccharide (PPSV23) vaccine. One dose is recommended after age 10. Talk to your health care provider about which screenings and vaccines you need and how often you need them. This information is not intended to replace advice given to you by your health care provider. Make sure you discuss any questions you have with your health care provider. Document Released: 07/22/2015 Document Revised: 03/14/2016 Document Reviewed: 04/26/2015 Elsevier Interactive Patient Education  2017 [place] Prevention in the Home Falls can cause injuries. They can happen to people of all ages. There are many things you can do to make your home safe and to help prevent falls. What can I do on the outside of my home? Regularly fix the edges of walkways and driveways and fix any cracks. Remove anything that might make you trip as you walk through a door, such as a raised step or threshold. Trim any bushes or trees on the path to your home. Use bright outdoor lighting. Clear any walking paths of anything that might make someone trip, such as rocks or tools. Regularly check to see if handrails are loose or broken. Make sure that both sides of any steps have handrails. Any raised decks and porches should have guardrails on the edges. Have any leaves, snow, or ice cleared regularly. Use sand or salt on walking paths during winter. Clean up any spills in your garage right away. This includes oil or grease spills. What can I do in the bathroom? Use night lights. Install grab bars by the toilet and in the tub and shower. Do not use towel bars as grab bars. Use non-skid mats or decals in the tub or shower. If you need to sit down in the shower, use a  plastic, non-slip stool. Keep the floor dry. Clean up any water that spills on the floor as soon as it happens. Remove soap buildup in the tub or shower regularly. Attach bath mats securely with double-sided non-slip rug tape. Do not have throw rugs and other things on the floor that can make you trip. What can I do in the bedroom? Use night lights. Make sure that you have a light by your bed that is easy to reach. Do not use any sheets or blankets that are too big for your bed. They should not hang down onto the floor. Have a firm chair that has side arms. You can use this for support while you get dressed. Do not have throw rugs and other things on the floor that can make you trip. What can I do in the kitchen? Clean up any spills right away. Avoid walking on wet floors. Keep items that you use a lot in easy-to-reach places. If you need to reach something above you, use a strong step stool that has a grab bar. Keep electrical cords out of the way. Do not use floor polish or wax that makes floors  slippery. If you must use wax, use non-skid floor wax. Do not have throw rugs and other things on the floor that can make you trip. What can I do with my stairs? Do not leave any items on the stairs. Make sure that there are handrails on both sides of the stairs and use them. Fix handrails that are broken or loose. Make sure that handrails are as long as the stairways. Check any carpeting to make sure that it is firmly attached to the stairs. Fix any carpet that is loose or worn. Avoid having throw rugs at the top or bottom of the stairs. If you do have throw rugs, attach them to the floor with carpet tape. Make sure that you have a light switch at the top of the stairs and the bottom of the stairs. If you do not have them, ask someone to add them for you. What else can I do to help prevent falls? Wear shoes that: Do not have high heels. Have rubber bottoms. Are comfortable and fit you  well. Are closed at the toe. Do not wear sandals. If you use a stepladder: Make sure that it is fully opened. Do not climb a closed stepladder. Make sure that both sides of the stepladder are locked into place. Ask someone to hold it for you, if possible. Clearly mark and make sure that you can see: Any grab bars or handrails. First and last steps. Where the edge of each step is. Use tools that help you move around (mobility aids) if they are needed. These include: Canes. Walkers. Scooters. Crutches. Turn on the lights when you go into a dark area. Replace any light bulbs as soon as they burn out. Set up your furniture so you have a clear path. Avoid moving your furniture around. If any of your floors are uneven, fix them. If there are any pets around you, be aware of where they are. Review your medicines with your doctor. Some medicines can make you feel dizzy. This can increase your chance of falling. Ask your doctor what other things that you can do to help prevent falls. This information is not intended to replace advice given to you by your health care provider. Make sure you discuss any questions you have with your health care provider. Document Released: 04/21/2009 Document Revised: 12/01/2015 Document Reviewed: 07/30/2014 Elsevier Interactive Patient Education  2017 [employer].

## 2022-12-25 ENCOUNTER — Other Ambulatory Visit: Payer: Self-pay | Admitting: Internal Medicine

## 2022-12-27 ENCOUNTER — Other Ambulatory Visit: Payer: Self-pay | Admitting: Internal Medicine

## 2023-03-24 ENCOUNTER — Other Ambulatory Visit: Payer: Self-pay | Admitting: Internal Medicine

## 2023-04-19 ENCOUNTER — Ambulatory Visit: Payer: PPO | Admitting: Internal Medicine

## 2023-04-19 VITALS — BP 142/82 | HR 108 | Temp 97.5°F | Ht 61.0 in | Wt 160.8 lb

## 2023-04-19 DIAGNOSIS — R7303 Prediabetes: Secondary | ICD-10-CM | POA: Diagnosis not present

## 2023-04-19 DIAGNOSIS — R030 Elevated blood-pressure reading, without diagnosis of hypertension: Secondary | ICD-10-CM | POA: Diagnosis not present

## 2023-04-19 DIAGNOSIS — F339 Major depressive disorder, recurrent, unspecified: Secondary | ICD-10-CM | POA: Diagnosis not present

## 2023-04-19 DIAGNOSIS — Z1231 Encounter for screening mammogram for malignant neoplasm of breast: Secondary | ICD-10-CM

## 2023-04-19 DIAGNOSIS — E785 Hyperlipidemia, unspecified: Secondary | ICD-10-CM

## 2023-04-19 DIAGNOSIS — E034 Atrophy of thyroid (acquired): Secondary | ICD-10-CM | POA: Diagnosis not present

## 2023-04-19 DIAGNOSIS — E66811 Obesity, class 1: Secondary | ICD-10-CM

## 2023-04-19 DIAGNOSIS — Z6830 Body mass index (BMI) 30.0-30.9, adult: Secondary | ICD-10-CM | POA: Diagnosis not present

## 2023-04-19 DIAGNOSIS — E663 Overweight: Secondary | ICD-10-CM

## 2023-04-19 DIAGNOSIS — Z23 Encounter for immunization: Secondary | ICD-10-CM

## 2023-04-19 LAB — CBC WITH DIFFERENTIAL/PLATELET
Basophils Absolute: 0 10*3/uL (ref 0.0–0.1)
Basophils Relative: 0.9 % (ref 0.0–3.0)
Eosinophils Absolute: 0.1 10*3/uL (ref 0.0–0.7)
Eosinophils Relative: 2.6 % (ref 0.0–5.0)
HCT: 40.5 % (ref 36.0–46.0)
Hemoglobin: 13.3 g/dL (ref 12.0–15.0)
Lymphocytes Relative: 38.2 % (ref 12.0–46.0)
Lymphs Abs: 1.8 10*3/uL (ref 0.7–4.0)
MCHC: 33 g/dL (ref 30.0–36.0)
MCV: 93.7 fL (ref 78.0–100.0)
Monocytes Absolute: 0.4 10*3/uL (ref 0.1–1.0)
Monocytes Relative: 9.1 % (ref 3.0–12.0)
Neutro Abs: 2.3 10*3/uL (ref 1.4–7.7)
Neutrophils Relative %: 49.2 % (ref 43.0–77.0)
Platelets: 194 10*3/uL (ref 150.0–400.0)
RBC: 4.32 Mil/uL (ref 3.87–5.11)
RDW: 13.4 % (ref 11.5–15.5)
WBC: 4.7 10*3/uL (ref 4.0–10.5)

## 2023-04-19 LAB — COMPREHENSIVE METABOLIC PANEL
ALT: 27 U/L (ref 0–35)
AST: 34 U/L (ref 0–37)
Albumin: 4.1 g/dL (ref 3.5–5.2)
Alkaline Phosphatase: 60 U/L (ref 39–117)
BUN: 19 mg/dL (ref 6–23)
CO2: 29 meq/L (ref 19–32)
Calcium: 9.2 mg/dL (ref 8.4–10.5)
Chloride: 102 meq/L (ref 96–112)
Creatinine, Ser: 1.01 mg/dL (ref 0.40–1.20)
GFR: 52.21 mL/min — ABNORMAL LOW (ref >60.00)
Glucose, Bld: 113 mg/dL — ABNORMAL HIGH (ref 70–99)
Potassium: 4.5 meq/L (ref 3.5–5.1)
Sodium: 138 meq/L (ref 135–145)
Total Bilirubin: 0.7 mg/dL (ref 0.2–1.2)
Total Protein: 6.5 g/dL (ref 6.0–8.3)

## 2023-04-19 LAB — LDL CHOLESTEROL, DIRECT: Direct LDL: 83 mg/dL

## 2023-04-19 LAB — LIPID PANEL
Cholesterol: 165 mg/dL (ref 0–200)
HDL: 65.7 mg/dL (ref >39.00)
LDL Cholesterol: 86 mg/dL (ref 0–99)
NonHDL: 99.71
Total CHOL/HDL Ratio: 3
Triglycerides: 69 mg/dL (ref 0.0–149.0)
VLDL: 13.8 mg/dL (ref 0.0–40.0)

## 2023-04-19 LAB — TSH: TSH: 2.33 u[IU]/mL (ref 0.35–5.50)

## 2023-04-19 LAB — MICROALBUMIN / CREATININE URINE RATIO
Creatinine,U: 58.5 mg/dL
Microalb Creat Ratio: 1.2 mg/g (ref 0.0–30.0)
Microalb, Ur: <0.7 mg/dL (ref 0.0–1.9)

## 2023-04-19 LAB — HEMOGLOBIN A1C: Hgb A1c MFr Bld: 5.9 % (ref 4.6–6.5)

## 2023-04-19 NOTE — Progress Notes (Signed)
Subjective:  Patient ID: Mary Vasquez, female    DOB: [birth year]  Age: 82 y.o. MRN: [id number]  CC: The primary encounter diagnosis was Hypothyroidism due to acquired atrophy of thyroid. Diagnoses of Hyperlipidemia with target LDL less than 100, Overweight (BMI 25.0-29.9), Prediabetes, Encounter for screening mammogram for malignant neoplasm of breast, Need for influenza vaccination, Elevated blood pressure reading in office without diagnosis of hypertension, Major depressive disorder, recurrent episode with melancholic features (HCC), and Class 1 obesity without serious comorbidity with body mass index (BMI) of 30.0 to 30.9 in adult, unspecified obesity type were also pertinent to this visit.   HPI Mary Vasquez presents for  Chief Complaint  Patient presents with   Medical Management of Chronic Issues   1) CKD stage 3:  she has been following up with nephrology every 6 months.    2) HTN:  Patient has no history of HTN . Has stopped checking  because home readings have been normal.  Does not use NSAIDS.  Had a high sodium meal yesterday Mary Vasquez)  followed by peanut butter crackers last evening   Home BP readings have been done about once per week and are  generally < 130/80 .  She is avoiding added salt in her diet and walking regularly about 3 times per week for exercise.    3) Grief:  she lost her long time companion several months ago.and her daughter last year     Outpatient Medications Prior to Visit  Medication Sig Dispense Refill   atorvastatin (LIPITOR) 20 MG tablet TAKE 1 TABLET BY MOUTH EVERY DAY 90 tablet 3   cetirizine (ZYRTEC) 10 MG tablet Take 10 mg by mouth daily.     Cholecalciferol (VITAMIN D3) 25 MCG (1000 UT) CAPS Take 1,000 Units by mouth 2 (two) times daily.     cyanocobalamin (VITAMIN B12) 1000 MCG tablet Take 1,000 mcg by mouth 2 (two) times daily.     furosemide (LASIX) 20 MG tablet TAKE 1 TABLET (20 MG TOTAL) BY MOUTH DAILY. AS NEEDED FOR FLUID RETENTION ,  90 tablet 1   levothyroxine (SYNTHROID) 25 MCG tablet TAKE 1 TABLET BY MOUTH DAILY BEFORE BREAKFAST EXCEPT ON SUNDAY AND THURSDAY TAKE 2 TABS 114 tablet 1   Probiotic Product (TRUBIOTICS PO) Take by mouth daily.     sertraline (ZOLOFT) 50 MG tablet TAKE 1 TABLET BY MOUTH EVERY DAY 90 tablet 2   Turmeric Curcumin 500 MG CAPS Take by mouth daily.     No facility-administered medications prior to visit.    Review of Systems;  Patient denies headache, fevers, malaise, unintentional weight loss, skin rash, eye pain, sinus congestion and sinus pain, sore throat, dysphagia,  hemoptysis , cough, dyspnea, wheezing, chest pain, palpitations, orthopnea, edema, abdominal pain, nausea, melena, diarrhea, constipation, flank pain, dysuria, hematuria, urinary  Frequency, nocturia, numbness, tingling, seizures,  Focal weakness, Loss of consciousness,  Tremor, insomnia, depression, anxiety, and suicidal ideation.      Objective:  BP (!) 142/82   Pulse (!) 108   Temp (!) 97.5 F (36.4 C) (Oral)   Ht 5\' 1"  (1.549 m)   Wt 160 lb 12.8 oz (72.9 kg)   SpO2 96%   BMI 30.38 kg/m   BP Readings from Last 3 Encounters:  04/19/23 (!) 142/82  10/18/22 128/71  07/19/22 (!) 145/76    Wt Readings from Last 3 Encounters:  04/19/23 160 lb 12.8 oz (72.9 kg)  11/02/22 156 lb (70.8 kg)  10/18/22 156 lb 9.6  oz (71 kg)    Physical Exam Vitals reviewed.  Constitutional:      General: She is not in acute distress.    Appearance: Normal appearance. She is normal weight. She is not ill-appearing, toxic-appearing or diaphoretic.  HENT:     Head: Normocephalic.  Eyes:     General: No scleral icterus.       Right eye: No discharge.        Left eye: No discharge.     Conjunctiva/sclera: Conjunctivae normal.  Cardiovascular:     Rate and Rhythm: Normal rate and regular rhythm.     Heart sounds: Normal heart sounds.  Pulmonary:     Effort: Pulmonary effort is normal. No respiratory distress.     Breath sounds:  Normal breath sounds.  Musculoskeletal:        General: Normal range of motion.  Skin:    General: Skin is warm and dry.  Neurological:     General: No focal deficit present.     Mental Status: She is alert and oriented to person, place, and time. Mental status is at baseline.  Psychiatric:        Mood and Affect: Mood normal.        Behavior: Behavior normal.        Thought Content: Thought content normal.        Judgment: Judgment normal.    Lab Results  Component Value Date   HGBA1C 5.9 04/19/2023   HGBA1C 6.0 09/27/2021   HGBA1C 5.9 09/21/2020    Lab Results  Component Value Date   CREATININE 1.01 04/19/2023   CREATININE 1.08 10/18/2022   CREATININE 1.08 03/30/2022    Lab Results  Component Value Date   WBC 4.7 04/19/2023   HGB 13.3 04/19/2023   HCT 40.5 04/19/2023   PLT 194.0 04/19/2023   GLUCOSE 113 (H) 04/19/2023   CHOL 165 04/19/2023   TRIG 69.0 04/19/2023   HDL 65.70 04/19/2023   LDLDIRECT 83.0 04/19/2023   LDLCALC 86 04/19/2023   ALT 27 04/19/2023   AST 34 04/19/2023   NA 138 04/19/2023   K 4.5 04/19/2023   CL 102 04/19/2023   CREATININE 1.01 04/19/2023   BUN 19 04/19/2023   CO2 29 04/19/2023   TSH 2.33 04/19/2023   INR 1.11 07/31/2018   HGBA1C 5.9 04/19/2023   MICROALBUR <0.7 04/19/2023    MM 3D SCREEN BREAST BILATERAL  Result Date: 06/12/2022 CLINICAL DATA:  Screening. EXAM: DIGITAL SCREENING BILATERAL MAMMOGRAM WITH TOMOSYNTHESIS AND CAD TECHNIQUE: Bilateral screening digital craniocaudal and mediolateral oblique mammograms were obtained. Bilateral screening digital breast tomosynthesis was performed. The images were evaluated with computer-aided detection. COMPARISON:  Previous exam(s). ACR Breast Density Category c: The breast tissue is heterogeneously dense, which may obscure small masses. FINDINGS: There are no findings suspicious for malignancy. IMPRESSION: No mammographic evidence of malignancy. A result letter of this screening mammogram  will be mailed directly to the patient. RECOMMENDATION: Screening mammogram in one year. (Code:SM-B-01Y) BI-RADS CATEGORY  1: Negative. Electronically Signed   By: Harmon Pier M.D.   On: 06/12/2022 13:42    Assessment & Plan:  .Hypothyroidism due to acquired atrophy of thyroid Assessment & Plan: TSH  is normal.. she is Currently taking 1 25 mcg 5 days per week and 50 mcg  2 days per week for total weekly dose of 225 mcg   Lab Results  Component Value Date   TSH 2.33 04/19/2023     Orders: -  TSH  Hyperlipidemia with target LDL less than 100 Assessment & Plan: Tolerating atorvastatin 20 mg dose   Lab Results  Component Value Date   CHOL 165 04/19/2023   HDL 65.70 04/19/2023   LDLCALC 86 04/19/2023   LDLDIRECT 83.0 04/19/2023   TRIG 69.0 04/19/2023   CHOLHDL 3 04/19/2023   Lab Results  Component Value Date   ALT 27 04/19/2023   AST 34 04/19/2023   ALKPHOS 60 04/19/2023   BILITOT 0.7 04/19/2023      Orders: -     Lipid panel -     LDL cholesterol, direct  Overweight (BMI 25.0-29.9) Assessment & Plan: I have addressed  BMI and recommended a low glycemic index diet utilizing smaller more frequent meals to increase metabolism.  I have also recommended that patient start exercising with a goal of 30 minutes of aerobic exercise a minimum of 5 days per week. Screening for lipid disorders, thyroid and diabetes has been done   Orders: -     CBC with Differential/Platelet  Prediabetes -     Comprehensive metabolic panel -     Hemoglobin A1c -     Microalbumin / creatinine urine ratio  Encounter for screening mammogram for malignant neoplasm of breast -     3D Screening Mammogram, Left and Right; Future  Need for influenza vaccination -     Flu Vaccine Trivalent High Dose (Fluad)  Elevated blood pressure reading in office without diagnosis of hypertension Assessment & Plan: She has no history of hypertension but has had an elevated reading.  She has been asked to  check her pressures at home and submit readings for evaluation. Renal function will be checked today   Lab Results  Component Value Date   CREATININE 1.01 04/19/2023   Lab Results  Component Value Date   NA 138 04/19/2023   K 4.5 04/19/2023   CL 102 04/19/2023   CO2 29 04/19/2023      Major depressive disorder, recurrent episode with melancholic features (HCC) Assessment & Plan: Symptoms improved despite grief following 1) loss of daughter and 2) her companions'' death due to advanced dementia.   Continue sertraline for now. She did not tolerate wellbutrin in the past.    Class 1 obesity without serious comorbidity with body mass index (BMI) of 30.0 to 30.9 in adult, unspecified obesity type Assessment & Plan: I have addressed  BMI and recommended a low glycemic index diet utilizing smaller more frequent meals to increase metabolism.  I have also recommended that patient start exercising with a goal of 30 minutes of aerobic exercise a minimum of 5 days per week. Screening for lipid disorders, thyroid and diabetes has been done      Follow-up: No follow-ups on file.   Sherlene Shams, MD

## 2023-04-19 NOTE — Patient Instructions (Addendum)
Your blood pressure was elevated today.  The current "normal" BP is 130/80 or less.   Please check your blood pressure ONCE DAY AT VARIABLE TIMES FOR THE NEXT 7 DAYS and send me the readings so I can determine if you need  to start  medication    Here are some numbers to remember:   60 ounces of water daily 60 grams of protein daily   Cut starches back to 2 servings daily (NO LONGER 4 )

## 2023-04-19 NOTE — Assessment & Plan Note (Addendum)
Tolerating atorvastatin 20 mg dose   Lab Results  Component Value Date   CHOL 165 04/19/2023   HDL 65.70 04/19/2023   LDLCALC 86 04/19/2023   LDLDIRECT 83.0 04/19/2023   TRIG 69.0 04/19/2023   CHOLHDL 3 04/19/2023   Lab Results  Component Value Date   ALT 27 04/19/2023   AST 34 04/19/2023   ALKPHOS 60 04/19/2023   BILITOT 0.7 04/19/2023

## 2023-04-21 NOTE — Assessment & Plan Note (Signed)
She has no history of hypertension but has had an elevated reading.  She has been asked to check her pressures at home and submit readings for evaluation. Renal function will be checked today   Lab Results  Component Value Date   CREATININE 1.01 04/19/2023   Lab Results  Component Value Date   NA 138 04/19/2023   K 4.5 04/19/2023   CL 102 04/19/2023   CO2 29 04/19/2023

## 2023-04-21 NOTE — Assessment & Plan Note (Signed)
Symptoms improved despite grief following 1) loss of daughter and 2) her companions'' death due to advanced dementia.   Continue sertraline for now. She did not tolerate wellbutrin in the past.

## 2023-04-21 NOTE — Assessment & Plan Note (Signed)
TSH  is normal.. she is Currently taking 1 25 mcg 5 days per week and 50 mcg  2 days per week for total weekly dose of 225 mcg   Lab Results  Component Value Date   TSH 2.33 04/19/2023

## 2023-04-21 NOTE — Assessment & Plan Note (Signed)
I have addressed  BMI and recommended a low glycemic index diet utilizing smaller more frequent meals to increase metabolism.  I have also recommended that patient start exercising with a goal of 30 minutes of aerobic exercise a minimum of 5 days per week. Screening for lipid disorders, thyroid and diabetes has  been done

## 2023-04-25 DIAGNOSIS — N1831 Chronic kidney disease, stage 3a: Secondary | ICD-10-CM | POA: Diagnosis not present

## 2023-04-25 DIAGNOSIS — R600 Localized edema: Secondary | ICD-10-CM | POA: Diagnosis not present

## 2023-04-25 DIAGNOSIS — N2581 Secondary hyperparathyroidism of renal origin: Secondary | ICD-10-CM | POA: Diagnosis not present

## 2023-04-29 ENCOUNTER — Telehealth: Payer: Self-pay | Admitting: Internal Medicine

## 2023-04-29 DIAGNOSIS — N2581 Secondary hyperparathyroidism of renal origin: Secondary | ICD-10-CM | POA: Diagnosis not present

## 2023-04-29 DIAGNOSIS — R600 Localized edema: Secondary | ICD-10-CM | POA: Diagnosis not present

## 2023-04-29 DIAGNOSIS — I890 Lymphedema, not elsewhere classified: Secondary | ICD-10-CM | POA: Diagnosis not present

## 2023-04-29 DIAGNOSIS — N1831 Chronic kidney disease, stage 3a: Secondary | ICD-10-CM | POA: Diagnosis not present

## 2023-04-29 NOTE — Telephone Encounter (Signed)
Patient called and had some concerns regarding  furosemide (LASIX) 20 MG tablet and her BP . Please call patient.

## 2023-04-29 NOTE — Telephone Encounter (Signed)
Spoke with pt and she stated that she saw Dr. Thedore Mins today. He advised pt to just keep monitoring her bp for now because he wanted to get her edema under control first. Pt stated that he has advised her to increase her Lasix to daily instead of every other day. Pt wanted to know if you were in agreement with that since you were the one that told to take the Lasix every other day.   3 most recent bp readings:  148/83  60 126/65  61 138/72  59

## 2023-04-30 NOTE — Telephone Encounter (Signed)
Pt is aware and gave a verbal understanding.  

## 2023-06-19 ENCOUNTER — Other Ambulatory Visit: Payer: Self-pay | Admitting: Internal Medicine

## 2023-06-20 ENCOUNTER — Other Ambulatory Visit: Payer: Self-pay | Admitting: Internal Medicine

## 2023-07-29 ENCOUNTER — Ambulatory Visit
Admission: RE | Admit: 2023-07-29 | Discharge: 2023-07-29 | Disposition: A | Discharge status: Home / Self Care | Payer: PPO | Source: Ambulatory Visit | Attending: Internal Medicine | Admitting: Internal Medicine

## 2023-07-29 DIAGNOSIS — Z1231 Encounter for screening mammogram for malignant neoplasm of breast: Secondary | ICD-10-CM | POA: Diagnosis not present

## 2023-09-12 ENCOUNTER — Telehealth: Payer: Self-pay | Admitting: Internal Medicine

## 2023-09-12 NOTE — Telephone Encounter (Signed)
 Copied from CRM [id number]. Topic: Medicare AWV >> Sep 12, 2023 11:08 AM Payton Doughty wrote: Reason for CRM: Called LVM 09/12/2023 to schedule AWV. Please schedule office or virtual visits.  Verlee Rossetti; Care Guide Ambulatory Clinical Support [place] [facility] Direct Dial: [phone]

## 2023-09-16 ENCOUNTER — Other Ambulatory Visit: Payer: Self-pay | Admitting: Internal Medicine

## 2023-10-11 DIAGNOSIS — H04123 Dry eye syndrome of bilateral lacrimal glands: Secondary | ICD-10-CM | POA: Diagnosis not present

## 2023-10-11 DIAGNOSIS — Z961 Presence of intraocular lens: Secondary | ICD-10-CM | POA: Diagnosis not present

## 2023-10-11 DIAGNOSIS — H43813 Vitreous degeneration, bilateral: Secondary | ICD-10-CM | POA: Diagnosis not present

## 2023-11-15 ENCOUNTER — Ambulatory Visit

## 2023-12-12 ENCOUNTER — Other Ambulatory Visit: Payer: Self-pay | Admitting: Internal Medicine

## 2023-12-13 ENCOUNTER — Ambulatory Visit

## 2023-12-13 ENCOUNTER — Other Ambulatory Visit: Payer: Self-pay | Admitting: Internal Medicine

## 2024-01-01 ENCOUNTER — Ambulatory Visit: Admitting: Internal Medicine

## 2024-01-01 ENCOUNTER — Encounter: Payer: Self-pay | Admitting: Internal Medicine

## 2024-01-01 VITALS — BP 142/70 | HR 71 | Ht 61.0 in | Wt 160.6 lb

## 2024-01-01 DIAGNOSIS — R600 Localized edema: Secondary | ICD-10-CM | POA: Diagnosis not present

## 2024-01-01 DIAGNOSIS — R030 Elevated blood-pressure reading, without diagnosis of hypertension: Secondary | ICD-10-CM | POA: Diagnosis not present

## 2024-01-01 DIAGNOSIS — N1831 Chronic kidney disease, stage 3a: Secondary | ICD-10-CM

## 2024-01-01 DIAGNOSIS — E034 Atrophy of thyroid (acquired): Secondary | ICD-10-CM

## 2024-01-01 DIAGNOSIS — E785 Hyperlipidemia, unspecified: Secondary | ICD-10-CM

## 2024-01-01 LAB — COMPREHENSIVE METABOLIC PANEL WITH GFR
ALT: 27 U/L (ref 0–35)
AST: 35 U/L (ref 0–37)
Albumin: 4.1 g/dL (ref 3.5–5.2)
Alkaline Phosphatase: 57 U/L (ref 39–117)
BUN: 20 mg/dL (ref 6–23)
CO2: 29 meq/L (ref 19–32)
Calcium: 9.6 mg/dL (ref 8.4–10.5)
Chloride: 103 meq/L (ref 96–112)
Creatinine, Ser: 1.05 mg/dL (ref 0.40–1.20)
GFR: 49.59 mL/min — ABNORMAL LOW (ref >60.00)
Glucose, Bld: 99 mg/dL (ref 70–99)
Potassium: 3.8 meq/L (ref 3.5–5.1)
Sodium: 139 meq/L (ref 135–145)
Total Bilirubin: 0.7 mg/dL (ref 0.2–1.2)
Total Protein: 7.2 g/dL (ref 6.0–8.3)

## 2024-01-01 LAB — LIPID PANEL
Cholesterol: 170 mg/dL (ref 0–200)
HDL: 70.7 mg/dL (ref >39.00)
LDL Cholesterol: 83 mg/dL (ref 0–99)
NonHDL: 99.74
Total CHOL/HDL Ratio: 2
Triglycerides: 82 mg/dL (ref 0.0–149.0)
VLDL: 16.4 mg/dL (ref 0.0–40.0)

## 2024-01-01 LAB — LDL CHOLESTEROL, DIRECT: Direct LDL: 82 mg/dL

## 2024-01-01 LAB — TSH: TSH: 1.86 u[IU]/mL (ref 0.35–5.50)

## 2024-01-01 NOTE — Assessment & Plan Note (Signed)
 Referring to [facility] to rule out lymphedema

## 2024-01-01 NOTE — Patient Instructions (Addendum)
 Dr Dennise and I want Your BP to  be kept under 130/80 to preserve your kidney function .  If your home readings are higher than that,   I agree with Dr Dennise about starting losartan and will prescribe it for you    I have made the referral to [facility] who have the circulation in your legs evaluated,  If you have lymphedema this can be managed with a home pneumatic sysem that pumps your legs to push fluid out of them

## 2024-01-01 NOTE — Assessment & Plan Note (Signed)
 She has no history of hypertension but has had several elevated readings.  She has been asked to check her pressures at home and submit readings for evaluation. Renal function will be checked today .  If home readings are 130/80 or higher,  will start losartan

## 2024-01-01 NOTE — Progress Notes (Signed)
 Subjective:  Patient ID: Mary Vasquez, female    DOB: [birth year]  Age: 83 y.o. MRN: [id number]  CC: The primary encounter diagnosis was Hypothyroidism due to acquired atrophy of thyroid . Diagnoses of Hyperlipidemia with target LDL less than 100, Elevated blood pressure reading in office without diagnosis of hypertension, Bilateral lower extremity edema, and Stage 3a chronic kidney disease (HCC) were also pertinent to this visit.   HPI [name] presents for  Chief Complaint  Patient presents with   Medical Management of Chronic Issues    6 month follow up     1) CKD 3a:  [name] was seen by Dennise in October ; her dose of lasix  was increased to daily for mgmt of new onset LE edema.  (Likely lymphedema )  and advised to consider losartan for elevated BP   2): HTN:   new onset (elevated in October as well) . Home reaings between 130/80  and 150/90  3) Grief:  lost her oldest brother in May.  One remaining sibling 4 yrs younger struggling with COPD and DM .recently hospitalized for metapneuomvirus   Outpatient Medications Prior to Visit  Medication Sig Dispense Refill   atorvastatin  (LIPITOR) 20 MG tablet TAKE 1 TABLET BY MOUTH EVERY DAY 90 tablet 3   cetirizine (ZYRTEC) 10 MG tablet Take 10 mg by mouth daily.     Cholecalciferol  (VITAMIN D3) 25 MCG (1000 UT) CAPS Take 1,000 Units by mouth 2 (two) times daily.     cyanocobalamin  (VITAMIN B12) 1000 MCG tablet Take 1,000 mcg by mouth 2 (two) times daily.     furosemide  (LASIX ) 20 MG tablet TAKE 1 TABLET (20 MG TOTAL) BY MOUTH DAILY. AS NEEDED FOR FLUID RETENTION , 90 tablet 1   levothyroxine  (SYNTHROID ) 25 MCG tablet TAKE 1 TABLET BY MOUTH DAILY BEFORE BREAKFAST EXCEPT ON SUNDAY AND THURSDAY TAKE 2 TABS 114 tablet 1   Probiotic Product (TRUBIOTICS PO) Take by mouth daily.     sertraline  (ZOLOFT ) 50 MG tablet TAKE 1 TABLET BY MOUTH EVERY DAY 90 tablet 1   Turmeric Curcumin 500 MG CAPS Take by mouth daily.     No facility-administered  medications prior to visit.    Review of Systems;  Patient denies headache, fevers, malaise, unintentional weight loss, skin rash, eye pain, sinus congestion and sinus pain, sore throat, dysphagia,  hemoptysis , cough, dyspnea, wheezing, chest pain, palpitations, orthopnea, edema, abdominal pain, nausea, melena, diarrhea, constipation, flank pain, dysuria, hematuria, urinary  Frequency, nocturia, numbness, tingling, seizures,  Focal weakness, Loss of consciousness,  Tremor, insomnia, depression, anxiety, and suicidal ideation.      Objective:  BP (!) 142/70   Pulse 71   Ht 5' 1 (1.549 m)   Wt 160 lb 9.6 oz (72.8 kg)   SpO2 97%   BMI 30.35 kg/m   BP Readings from Last 3 Encounters:  01/01/24 (!) 142/70  04/19/23 (!) 142/82  10/18/22 128/71    Wt Readings from Last 3 Encounters:  01/01/24 160 lb 9.6 oz (72.8 kg)  04/19/23 160 lb 12.8 oz (72.9 kg)  11/02/22 156 lb (70.8 kg)    Physical Exam Vitals reviewed.  Constitutional:      General: She is not in acute distress.    Appearance: Normal appearance. She is normal weight. She is not ill-appearing, toxic-appearing or diaphoretic.  HENT:     Head: Normocephalic.   Eyes:     General: No scleral icterus.       Right eye: No  discharge.        Left eye: No discharge.     Conjunctiva/sclera: Conjunctivae normal.    Cardiovascular:     Rate and Rhythm: Normal rate and regular rhythm.     Heart sounds: Normal heart sounds.  Pulmonary:     Effort: Pulmonary effort is normal. No respiratory distress.     Breath sounds: Normal breath sounds.   Musculoskeletal:        General: Normal range of motion.   Skin:    General: Skin is warm and dry.   Neurological:     General: No focal deficit present.     Mental Status: She is alert and oriented to person, place, and time. Mental status is at baseline.   Psychiatric:        Mood and Affect: Mood normal.        Behavior: Behavior normal.        Thought Content: Thought  content normal.        Judgment: Judgment normal.    Lab Results  Component Value Date   HGBA1C 5.9 04/19/2023   HGBA1C 6.0 09/27/2021   HGBA1C 5.9 09/21/2020    Lab Results  Component Value Date   CREATININE 1.05 01/01/2024   CREATININE 1.01 04/19/2023   CREATININE 1.08 10/18/2022    Lab Results  Component Value Date   WBC 4.7 04/19/2023   HGB 13.3 04/19/2023   HCT 40.5 04/19/2023   PLT 194.0 04/19/2023   GLUCOSE 99 01/01/2024   CHOL 170 01/01/2024   TRIG 82.0 01/01/2024   HDL 70.70 01/01/2024   LDLDIRECT 82.0 01/01/2024   LDLCALC 83 01/01/2024   ALT 27 01/01/2024   AST 35 01/01/2024   NA 139 01/01/2024   K 3.8 01/01/2024   CL 103 01/01/2024   CREATININE 1.05 01/01/2024   BUN 20 01/01/2024   CO2 29 01/01/2024   TSH 1.86 01/01/2024   INR 1.11 07/31/2018   HGBA1C 5.9 04/19/2023   MICROALBUR <0.7 04/19/2023    MM 3D SCREENING MAMMOGRAM BILATERAL BREAST Result Date: 07/30/2023 CLINICAL DATA:  Screening. EXAM: DIGITAL SCREENING BILATERAL MAMMOGRAM WITH TOMOSYNTHESIS AND CAD TECHNIQUE: Bilateral screening digital craniocaudal and mediolateral oblique mammograms were obtained. Bilateral screening digital breast tomosynthesis was performed. The images were evaluated with computer-aided detection. COMPARISON:  Previous exam(s). ACR Breast Density Category c: The breasts are heterogeneously dense, which may obscure small masses. FINDINGS: There are no findings suspicious for malignancy. IMPRESSION: No mammographic evidence of malignancy. A result letter of this screening mammogram will be mailed directly to the patient. RECOMMENDATION: Screening mammogram in one year. (Code:SM-B-01Y) BI-RADS CATEGORY  1: Negative. Electronically Signed   By: Delon Music M.D.   On: 07/30/2023 13:48    Assessment & Plan:  .Hypothyroidism due to acquired atrophy of thyroid  -     TSH  Hyperlipidemia with target LDL less than 100 -     Comprehensive metabolic panel with GFR -     Lipid  panel -     LDL cholesterol, direct  Elevated blood pressure reading in office without diagnosis of hypertension Assessment & Plan: She has no history of hypertension but has had several elevated readings.  She has been asked to check her pressures at home and submit readings for evaluation. Renal function will be checked today .  If home readings are 130/80 or higher,  will start losartan    Bilateral lower extremity edema Assessment & Plan: Referring to [facility] to rule out lymphedema   Orders: -  Ambulatory referral to Vascular Surgery  Stage 3a chronic kidney disease [facility]) Assessment & Plan: Reviewed Dr Aureliano last note  She is nadvised to consider adding ARB and has been using diuretic daily . CR is stable  Lab Results  Component Value Date   CREATININE 1.05 01/01/2024   Lab Results  Component Value Date   NA 139 01/01/2024   K 3.8 01/01/2024   CL 103 01/01/2024   CO2 29 01/01/2024         I spent 34 minutes on the day of this face to face encounter reviewing patient's  most recent visit with nephrology ,  prior relevant surgical and non surgical procedures, recent  labs and imaging studies, counseling on hypertension  management,  reviewing the assessment and plan with patient, and post visit ordering and reviewing of  diagnostics and therapeutics with patient  .   Follow-up: No follow-ups on file.   Verneita LITTIE Kettering, MD

## 2024-01-01 NOTE — Assessment & Plan Note (Signed)
 Reviewed Dr Aureliano last note  She is nadvised to consider adding ARB and has been using diuretic daily . CR is stable  Lab Results  Component Value Date   CREATININE 1.05 01/01/2024   Lab Results  Component Value Date   NA 139 01/01/2024   K 3.8 01/01/2024   CL 103 01/01/2024   CO2 29 01/01/2024

## 2024-01-03 ENCOUNTER — Ambulatory Visit: Payer: Self-pay | Admitting: Internal Medicine

## 2024-01-07 ENCOUNTER — Ambulatory Visit

## 2024-01-23 ENCOUNTER — Ambulatory Visit (INDEPENDENT_AMBULATORY_CARE_PROVIDER_SITE_OTHER): Payer: Self-pay | Admitting: Vascular Surgery

## 2024-01-23 ENCOUNTER — Encounter (INDEPENDENT_AMBULATORY_CARE_PROVIDER_SITE_OTHER): Payer: Self-pay | Admitting: Vascular Surgery

## 2024-01-23 VITALS — BP 140/82 | HR 65 | Ht 61.0 in | Wt 161.5 lb

## 2024-01-23 DIAGNOSIS — E785 Hyperlipidemia, unspecified: Secondary | ICD-10-CM | POA: Diagnosis not present

## 2024-01-23 DIAGNOSIS — I890 Lymphedema, not elsewhere classified: Secondary | ICD-10-CM

## 2024-01-23 DIAGNOSIS — N1831 Chronic kidney disease, stage 3a: Secondary | ICD-10-CM

## 2024-01-25 ENCOUNTER — Encounter (INDEPENDENT_AMBULATORY_CARE_PROVIDER_SITE_OTHER): Payer: Self-pay | Admitting: Vascular Surgery

## 2024-01-25 DIAGNOSIS — I890 Lymphedema, not elsewhere classified: Secondary | ICD-10-CM | POA: Insufficient documentation

## 2024-01-25 NOTE — Progress Notes (Signed)
 MRN : [id number]  STEPHAINE Vasquez is a 83 y.o. (11-Aug-1940) female who presents with chief complaint of legs hurt and swell.  History of Present Illness:   Patient is seen for evaluation of leg swelling. The patient first noticed the swelling remotely but is now concerned because of a significant increase in the overall edema. The swelling isn't associated with significant pain.  There has been an increasing amount of  discoloration noted by the patient. The patient notes that in the morning the legs are improved but they steadily worsened throughout the course of the day. Elevation seems to make the swelling of the legs better, dependency makes them much worse.   She does have CKD stage III which has been stable.  There is no history of ulcerations associated with the swelling.   The patient denies any recent changes in their medications.  The patient has not been wearing graduated compression.  The patient has no had any past angiography, interventions or vascular surgery.  The patient denies a history of DVT or PE. There is no prior history of phlebitis. There is no history of primary lymphedema.  There is no history of radiation treatment to the groin or pelvis No history of malignancies. No history of trauma or groin or pelvic surgery. No history of foreign travel or parasitic infections area   Current Meds  Medication Sig   atorvastatin  (LIPITOR) 20 MG tablet TAKE 1 TABLET BY MOUTH EVERY DAY   cetirizine (ZYRTEC) 10 MG tablet Take 10 mg by mouth daily.   Cholecalciferol  (VITAMIN D3) 25 MCG (1000 UT) CAPS Take 1,000 Units by mouth 2 (two) times daily.   cyanocobalamin  (VITAMIN B12) 1000 MCG tablet Take 1,000 mcg by mouth 2 (two) times daily.   furosemide  (LASIX ) 20 MG tablet TAKE 1 TABLET (20 MG TOTAL) BY MOUTH DAILY. AS NEEDED FOR FLUID RETENTION ,   levothyroxine  (SYNTHROID ) 25 MCG tablet TAKE 1 TABLET BY MOUTH DAILY BEFORE BREAKFAST EXCEPT ON SUNDAY AND THURSDAY TAKE  2 TABS   Probiotic Product (TRUBIOTICS PO) Take by mouth daily.   sertraline  (ZOLOFT ) 50 MG tablet TAKE 1 TABLET BY MOUTH EVERY DAY   Turmeric Curcumin 500 MG CAPS Take by mouth daily.    Past Medical History:  Diagnosis Date   Arthritis    Depression    situational   Family history of adverse reaction to anesthesia    Mother - altered mental state for a day or 2(age 40)   History of kidney stones    Hyperlipidemia    Hypertension    Hypothyroidism    Vertigo    none for several years    Past Surgical History:  Procedure Laterality Date   BREAST BIOPSY Left 2009   CORE - NEG...marker in breast   BREAST SURGERY  2009   stereotactic,  left,  benign    CATARACT EXTRACTION W/PHACO Left 11/25/2019   Procedure: CATARACT EXTRACTION PHACO AND INTRAOCULAR LENS PLACEMENT (IOC) LEFT 9.58 01:13.8 12.9%;  Surgeon: Mary Gaskin, MD;  Location: [facility];  Service: Ophthalmology;  Laterality: Left;   CATARACT EXTRACTION W/PHACO Right 12/23/2019   Procedure: CATARACT EXTRACTION PHACO AND INTRAOCULAR LENS PLACEMENT (IOC) RIGHT 12.74  01:08.4  18.6%;  Surgeon: Mary Gaskin, MD;  Location: [facility];  Service: Ophthalmology;  Laterality: Right;   HYSTEROSCOPY  2008   ROTATOR CUFF REPAIR Right 2009   Mary Vasquez   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  Procedure: TOTAL HIP ARTHROPLASTY ANTERIOR APPROACH;  Surgeon: Mary Lynwood SAUNDERS, MD;  Location: [facility] OR[facility];  Service: Orthopedics;  Laterality: Right;    Social History Social History   Tobacco Use   Smoking status: Never   Smokeless tobacco: Never  Vaping Use   Vaping status: Never Used  Substance Use Topics   Alcohol use: Yes    Alcohol/week: 3.0 standard drinks of alcohol    Types: 3 Glasses of wine per week   Drug use: No    Family History Family History  Problem Relation Age of Onset   Heart disease Mother 60       ami   Heart disease Father        ami   Diabetes Brother    Cancer Brother         prostate ca   Breast cancer Paternal Aunt        50'S   Breast cancer Cousin     Allergies  Allergen Reactions   Hydrocodone Nausea And Vomiting   Penicillin G Hives and Other (See Comments)    Only had reaction to Augmentin DID THE REACTION INVOLVE: Swelling of the face/tongue/throat, SOB, or low BP? Unknown Sudden or severe rash/hives, skin peeling, or the inside of the mouth or nose? Unknown Did it require medical treatment? Unknown When did it last happen? Unknown If all above answers are NO, may proceed with cephalosporin use.    Lexapro [Escitalopram]     Made her feel like a zombie    Wellbutrin [Bupropion]     Made hyperactive    Augmentin [Amoxicillin-Pot Clavulanate] Hives     REVIEW OF SYSTEMS (Negative unless checked)  Constitutional: [] Weight loss  [] Fever  [] Chills Cardiac: [] Chest pain   [] Chest pressure   [] Palpitations   [] Shortness of breath when laying flat   [] Shortness of breath with exertion. Vascular:  [] Pain in legs with walking   [x] Pain in legs at rest  [] History of DVT   [] Phlebitis   [x] Swelling in legs   [] Varicose veins   [] Non-healing ulcers Pulmonary:   [] Uses home oxygen   [] Productive cough   [] Hemoptysis   [] Wheeze  [] COPD   [] Asthma Neurologic:  [] Dizziness   [] Seizures   [] History of stroke   [] History of TIA  [] Aphasia   [] Vissual changes   [] Weakness or numbness in arm   [] Weakness or numbness in leg Musculoskeletal:   [] Joint swelling   [] Joint pain   [] Low back pain Hematologic:  [] Easy bruising  [] Easy bleeding   [] Hypercoagulable state   [] Anemic Gastrointestinal:  [] Diarrhea   [] Vomiting  [] Gastroesophageal reflux/heartburn   [] Difficulty swallowing. Genitourinary:  [] Chronic kidney disease   [] Difficult urination  [] Frequent urination   [] Blood in urine Skin:  [] Rashes   [] Ulcers  Psychological:  [] History of anxiety   []  History of major depression.  Physical Examination  Vitals:   01/23/24 1403  BP: (!) 140/82  Pulse: 65   Weight: 161 lb 8 oz (73.3 kg)  Height: 5' 1 (1.549 m)   Body mass index is 30.52 kg/m. Gen: WD/WN, NAD Head: /AT, No temporalis wasting.  Ear/Nose/Throat: Hearing grossly intact, nares w/o erythema or drainage, pinna without lesions Eyes: PER, EOMI, sclera nonicteric.  Neck: Supple, no gross masses.  No JVD.  Pulmonary:  Good air movement, no audible wheezing, no use of accessory muscles.  Cardiac: RRR, precordium not hyperdynamic. Vascular:  scattered varicosities present bilaterally.  Mild venous stasis changes to the legs bilaterally.  3+ soft  pitting edema. CEAP [id number]   Vessel Right Left  Radial Palpable Palpable  Gastrointestinal: soft, non-distended. No guarding/no peritoneal signs.  Musculoskeletal: M/S 5/5 throughout.  No deformity.  Neurologic: CN 2-12 intact. Pain and light touch intact in extremities.  Symmetrical.  Speech is fluent. Motor exam as listed above. Psychiatric: Judgment intact, Mood & affect appropriate for pt's clinical situation. Dermatologic: Venous rashes no ulcers noted.  No changes consistent with cellulitis. Lymph : No lichenification or skin changes of chronic lymphedema.  CBC Lab Results  Component Value Date   WBC 4.7 04/19/2023   HGB 13.3 04/19/2023   HCT 40.5 04/19/2023   MCV 93.7 04/19/2023   PLT 194.0 04/19/2023    BMET    Component Value Date/Time   NA 139 01/01/2024 1034   K 3.8 01/01/2024 1034   CL 103 01/01/2024 1034   CO2 29 01/01/2024 1034   GLUCOSE 99 01/01/2024 1034   BUN 20 01/01/2024 1034   CREATININE 1.05 01/01/2024 1034   CALCIUM  9.6 01/01/2024 1034   GFRNONAA >60 08/14/2018 0323   GFRAA >60 08/14/2018 0323   CrCl cannot be calculated (Patient's most recent lab result is older than the maximum 21 days allowed.).  COAG Lab Results  Component Value Date   INR 1.11 07/31/2018    Radiology No results found.   Assessment/Plan 1. Lymphedema (Primary) Recommend:  I have had a long discussion with the  patient regarding swelling and why it  causes symptoms.  Patient will begin wearing graduated compression on a daily basis a prescription was given. The patient will  wear the stockings first thing in the morning and removing them in the evening. The patient is instructed specifically not to sleep in the stockings.   In addition, behavioral modification will be initiated.  This will include frequent elevation, use of over the counter pain medications and exercise such as walking.  Consideration for a lymph pump will also be made based upon the effectiveness of conservative therapy.  This would help to improve the edema control and prevent sequela such as ulcers and infections   Patient should undergo duplex ultrasound of the venous system to ensure that DVT or reflux is not present.  The patient will follow-up with me after the ultrasound.  - VAS US  LOWER EXTREMITY VENOUS (DVT); Future  2. Stage 3a chronic kidney disease (HCC) The patient has advanced renal disease.  However, at the present time the patient is not yet on dialysis.  Nevertheless it is likely contributing to her chronic edema  Avoid nephrotoxic medications and dehydration.  Further plans per nephrology  3. Hyperlipidemia with target LDL less than 100 Continue pulmonary medications and aerosols as already ordered, these medications have been reviewed and there are no changes at this time.     Cordella Shawl, MD  01/25/2024 12:21 PM

## 2024-02-24 ENCOUNTER — Ambulatory Visit (INDEPENDENT_AMBULATORY_CARE_PROVIDER_SITE_OTHER)

## 2024-02-24 VITALS — Ht 61.0 in | Wt 160.0 lb

## 2024-02-24 DIAGNOSIS — Z0000 Encounter for general adult medical examination without abnormal findings: Secondary | ICD-10-CM

## 2024-02-24 DIAGNOSIS — Z780 Asymptomatic menopausal state: Secondary | ICD-10-CM | POA: Diagnosis not present

## 2024-02-24 NOTE — Progress Notes (Signed)
 Subjective:   Mary Vasquez is a 83 y.o. who presents for a Medicare Wellness preventive visit.  As a reminder, Annual Wellness Visits don't include a physical exam, and some assessments may be limited, especially if this visit is performed virtually. We may recommend an in-person follow-up visit with your provider if needed.  Visit Complete: Virtual I connected with  Mary Vasquez on 02/24/24 by a audio enabled telemedicine application and verified that I am speaking with the correct person using two identifiers.  Patient Location: Home  Provider Location: Home Office  I discussed the limitations of evaluation and management by telemedicine. The patient expressed understanding and agreed to proceed.  Vital Signs: Because this visit was a virtual/telehealth visit, some criteria may be missing or patient reported. Any vitals not documented were not able to be obtained and vitals that have been documented are patient reported.  VideoDeclined- This patient declined Librarian, academic. Therefore the visit was completed with audio only.  Persons Participating in Visit: Patient.  AWV Questionnaire: Yes: Patient Medicare AWV questionnaire was completed by the patient on 02/23/24; I have confirmed that all information answered by patient is correct and no changes since this date.  Cardiac Risk Factors include: advanced age (>64men, >57 women);dyslipidemia;obesity (BMI >30kg/m2)     Objective:    Today's Vitals   02/24/24 1343  Weight: 160 lb (72.6 kg)  Height: 5' 1 (1.549 m)   Body mass index is 30.23 kg/m.     02/24/2024    1:54 PM 11/02/2022   11:46 AM 08/02/2021    8:39 AM 06/09/2020   10:52 AM 12/23/2019    8:13 AM 11/25/2019   11:07 AM 06/09/2019   10:45 AM  Advanced Directives  Does Patient Have a Medical Advance Directive? No No No No No No Yes  Type of Advance Directive       Healthcare Power of Attorney  Does patient want to make changes to  medical advance directive?       No - Patient declined  Copy of Healthcare Power of Attorney in Chart?       No - copy requested  Would patient like information on creating a medical advance directive? No - Patient declined No - Patient declined No - Patient declined No - Patient declined No - Patient declined No - Patient declined     Current Medications (verified) Outpatient Encounter Medications as of 02/24/2024  Medication Sig   atorvastatin  (LIPITOR) 20 MG tablet TAKE 1 TABLET BY MOUTH EVERY DAY   cetirizine (ZYRTEC) 10 MG tablet Take 10 mg by mouth daily.   Cholecalciferol  (VITAMIN D3) 25 MCG (1000 UT) CAPS Take 1,000 Units by mouth 2 (two) times daily.   cyanocobalamin  (VITAMIN B12) 1000 MCG tablet Take 1,000 mcg by mouth 2 (two) times daily.   furosemide  (LASIX ) 20 MG tablet TAKE 1 TABLET (20 MG TOTAL) BY MOUTH DAILY. AS NEEDED FOR FLUID RETENTION ,   levothyroxine  (SYNTHROID ) 25 MCG tablet TAKE 1 TABLET BY MOUTH DAILY BEFORE BREAKFAST EXCEPT ON SUNDAY AND THURSDAY TAKE 2 TABS   Probiotic Product (TRUBIOTICS PO) Take by mouth daily.   sertraline  (ZOLOFT ) 50 MG tablet TAKE 1 TABLET BY MOUTH EVERY DAY   Turmeric Curcumin 500 MG CAPS Take by mouth daily.   No facility-administered encounter medications on file as of 02/24/2024.    Allergies (verified) Hydrocodone, Penicillin g, Lexapro [escitalopram], Wellbutrin [bupropion], and Augmentin [amoxicillin-pot clavulanate]   History: Past Medical History:  Diagnosis  Date   Arthritis    Depression    situational   Family history of adverse reaction to anesthesia    Mother - altered mental state for a day or 2(age 11)   History of kidney stones    Hyperlipidemia    Hypertension    Hypothyroidism    Vertigo    none for several years   Past Surgical History:  Procedure Laterality Date   BREAST BIOPSY Left 2009   CORE - NEG...marker in breast   BREAST SURGERY  2009   stereotactic,  left,  benign    CATARACT EXTRACTION W/PHACO  Left 11/25/2019   Procedure: CATARACT EXTRACTION PHACO AND INTRAOCULAR LENS PLACEMENT (IOC) LEFT 9.58 01:13.8 12.9%;  Surgeon: Mittie Gaskin, MD;  Location: [facility];  Service: Ophthalmology;  Laterality: Left;   CATARACT EXTRACTION W/PHACO Right 12/23/2019   Procedure: CATARACT EXTRACTION PHACO AND INTRAOCULAR LENS PLACEMENT (IOC) RIGHT 12.74  01:08.4  18.6%;  Surgeon: Mittie Gaskin, MD;  Location: [facility];  Service: Ophthalmology;  Laterality: Right;   HYSTEROSCOPY  2008   ROTATOR CUFF REPAIR Right 2009   Ted Armour   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   Procedure: TOTAL HIP ARTHROPLASTY ANTERIOR APPROACH;  Surgeon: Leora Lynwood SAUNDERS, MD;  Location: [facility] OR[facility];  Service: Orthopedics;  Laterality: Right;   Family History  Problem Relation Age of Onset   Heart disease Mother 18       ami   Heart disease Father        ami   Diabetes Brother    Cancer Brother        prostate ca   Breast cancer Paternal Aunt        50'S   Breast cancer Cousin    Social History   Socioeconomic History   Marital status: Divorced    Spouse name: Not on file   Number of children: Not on file   Years of education: Not on file   Highest education level: Some college, no degree  Occupational History   Occupation: receptionist    Comment: retired  Tobacco Use   Smoking status: Never   Smokeless tobacco: Never  Vaping Use   Vaping status: Never Used  Substance and Sexual Activity   Alcohol use: Yes    Alcohol/week: 3.0 standard drinks of alcohol    Types: 3 Glasses of wine per week   Drug use: No   Sexual activity: Not Currently  Other Topics Concern   Not on file  Social History Narrative   Not on file   Social Drivers of Health   Financial Resource Strain: Low Risk  (02/23/2024)   Overall Financial Resource Strain (CARDIA)    Difficulty of Paying Living Expenses: Not hard at all  Food Insecurity: No Food Insecurity (02/23/2024)   Hunger Vital Sign     Worried About Running Out of Food in the Last Year: Never true    Ran Out of Food in the Last Year: Never true  Transportation Needs: No Transportation Needs (02/23/2024)   PRAPARE - Administrator, Civil Service (Medical): No    Lack of Transportation (Non-Medical): No  Physical Activity: Insufficiently Active (02/23/2024)   Exercise Vital Sign    Days of Exercise per Week: 3 days    Minutes of Exercise per Session: 40 min  Stress: No Stress Concern Present (02/23/2024)   [employer] of Occupational Health - Occupational Stress Questionnaire    Feeling of Stress: Not at all  Social Connections: Socially Isolated (02/23/2024)   Social Connection and Isolation Panel    Frequency of Communication with Friends and Family: More than three times a week    Frequency of Social Gatherings with Friends and Family: More than three times a week    Attends Religious Services: Never    Database administrator or Organizations: No    Attends Engineer, structural: Never    Marital Status: Divorced    Tobacco Counseling Counseling given: Not Answered    Clinical Intake:  Pre-visit preparation completed: Yes  Pain : No/denies pain     BMI - recorded: 30.23 Nutritional Status: BMI > 30  Obese Nutritional Risks: None Diabetes: No  Lab Results  Component Value Date   HGBA1C 5.9 04/19/2023   HGBA1C 6.0 09/27/2021   HGBA1C 5.9 09/21/2020     How often do you need to have someone help you when you read instructions, pamphlets, or other written materials from your doctor or pharmacy?: 1 - Never  Interpreter Needed?: No  Information entered by :: R. Marlin Jarrard LPN   Activities of Daily Living     02/24/2024    1:45 PM  In your present state of health, do you have any difficulty performing the following activities:  Hearing? 0  Vision? 0  Difficulty concentrating or making decisions? 0  Walking or climbing stairs? 0  Dressing or bathing? 0  Doing errands,  shopping? 0  Preparing Food and eating ? N  Using the Toilet? N  In the past six months, have you accidently leaked urine? N  Do you have problems with loss of bowel control? N  Managing your Medications? N  Managing your Finances? N  Housekeeping or managing your Housekeeping? N    Patient Care Team: Marylynn Verneita CROME, MD as PCP - General (Internal Medicine) Jama, Cordella MATSU, MD (Vascular Surgery) Dennise Capri, MD as Consulting Physician (Nephrology)  I have updated your Care Teams any recent Medical Services you may have received from other providers in the past year.     Assessment:   This is a routine wellness examination for Adelai.  Hearing/Vision screen Hearing Screening - Comments:: No issues Vision Screening - Comments:: glasses   Goals Addressed             This Visit's Progress    Patient Stated       Wants to work on losing some weight       Depression Screen     02/24/2024    1:49 PM 01/01/2024    9:56 AM 04/19/2023   10:35 AM 11/02/2022   11:48 AM 10/18/2022    9:32 AM 07/04/2022    8:43 AM 03/30/2022    9:14 AM  PHQ 2/9 Scores  PHQ - 2 Score 0 0 2 2 2 1  0  PHQ- 9 Score 0 2 5 4 4 2 3     Fall Risk     02/24/2024    1:46 PM 01/01/2024    9:56 AM 04/19/2023   10:34 AM 11/02/2022   11:46 AM 10/18/2022    9:32 AM  Fall Risk   Falls in the past year? 0 0 0 0 0  Number falls in past yr: 0 0 0 0 0  Injury with Fall? 0 0 0  0  Risk for fall due to : No Fall Risks No Fall Risks No Fall Risks  No Fall Risks  Follow up Falls evaluation completed;Falls prevention discussed Falls evaluation completed Falls  evaluation completed Falls evaluation completed;Falls prevention discussed Falls evaluation completed    MEDICARE RISK AT HOME:  Medicare Risk at Home Any stairs in or around the home?: Yes If so, are there any without handrails?: No Home free of loose throw rugs in walkways, pet beds, electrical cords, etc?: Yes Adequate lighting in your home to  reduce risk of falls?: Yes Life alert?: No Use of a cane, walker or w/c?: No Grab bars in the bathroom?: Yes Shower chair or bench in shower?: Yes Elevated toilet seat or a handicapped toilet?: No  TIMED UP AND GO:  Was the test performed?  No  Cognitive Function: 6CIT completed    11/02/2022   11:43 AM 11/28/2015    2:12 PM  MMSE - Mini Mental State Exam  Not completed: Unable to complete   Orientation to time  5   Orientation to Place  5   Registration  3   Attention/ Calculation  5   Recall  3   Language- name 2 objects  2   Language- repeat  1  Language- follow 3 step command  3   Language- read & follow direction  1   Write a sentence  1   Copy design  1   Total score  30      Data saved with a previous flowsheet row definition        02/24/2024    1:56 PM 08/02/2021    8:45 AM 06/09/2020   11:00 AM 06/09/2019   10:57 AM  6CIT Screen  What Year? 0 points 0 points 0 points 0 points  What month? 0 points 0 points 0 points 0 points  What time? 0 points 0 points 0 points 0 points  Count back from 20 0 points 0 points 0 points 0 points  Months in reverse 0 points 0 points 0 points 0 points  Repeat phrase 2 points 0 points  0 points  Total Score 2 points 0 points  0 points    Immunizations Immunization History  Administered Date(s) Administered   Fluad Quad(high Dose 65+) 04/15/2019, 04/10/2021, 03/30/2022   Fluad Trivalent(High Dose 65+) 04/19/2023   Influenza Nasal 04/11/2015   Influenza, High Dose Seasonal PF 05/28/2016, 06/02/2018, 05/24/2020, 05/24/2020   Influenza-Unspecified 04/20/2013, 04/16/2014   PFIZER(Purple Top)SARS-COV-2 Vaccination 07/30/2019, 08/20/2019, 05/24/2020   Pneumococcal Conjugate-13 04/23/2013   Pneumococcal Polysaccharide-23 04/23/2006, 06/14/2014   Tdap 07/09/2009, 03/22/2021   Zoster Recombinant(Shingrix) 03/22/2021, 07/17/2021    Screening Tests Health Maintenance  Topic Date Due   COVID-19 Vaccine (4 - 2024-25 season)  03/10/2023   Medicare Annual Wellness (AWV)  11/02/2023   INFLUENZA VACCINE  02/07/2024   MAMMOGRAM  07/28/2024   DTaP/Tdap/Td (3 - Td or Tdap) 03/23/2031   Pneumococcal Vaccine: 50+ Years  Completed   DEXA SCAN  Completed   Zoster Vaccines- Shingrix  Completed   HPV VACCINES  Aged Out   Meningococcal B Vaccine  Aged Out   Pneumococcal Vaccine  Discontinued   Hepatitis C Screening  Discontinued    Health Maintenance  Health Maintenance Due  Topic Date Due   COVID-19 Vaccine (4 - 2024-25 season) 03/10/2023   Medicare Annual Wellness (AWV)  11/02/2023   INFLUENZA VACCINE  02/07/2024   Health Maintenance Items Addressed: DEXA ordered Discussed the need to update annual flu vaccine. Patient declines covid vaccine  Additional Screening:  Vision Screening: Recommended annual ophthalmology exams for early detection of glaucoma and other disorders of the eye. Up to date [employer]  Would you like a referral to an eye doctor? No    Dental Screening: Recommended annual dental exams for proper oral hygiene  Community Resource Referral / Chronic Care Management: CRR required this visit?  No   CCM required this visit?  No   Plan:    I have personally reviewed and noted the following in the patient's chart:   Medical and social history Use of alcohol, tobacco or illicit drugs  Current medications and supplements including opioid prescriptions. Patient is not currently taking opioid prescriptions. Functional ability and status Nutritional status Physical activity Advanced directives List of other physicians Hospitalizations, surgeries, and ER visits in previous 12 months Vitals Screenings to include cognitive, depression, and falls Referrals and appointments  In addition, I have reviewed and discussed with patient certain preventive protocols, quality metrics, and best practice recommendations. A written personalized care plan for preventive services as well as general  preventive health recommendations were provided to patient.   Angeline Fredericks, LPN   [id number]   After Visit Summary: (MyChart) Due to this being a telephonic visit, the after visit summary with patients personalized plan was offered to patient via MyChart   Notes: Nothing significant to report at this time.

## 2024-02-24 NOTE — Patient Instructions (Signed)
 Mary Vasquez , Thank you for taking time out of your busy schedule to complete your Annual Wellness Visit with me. I enjoyed our conversation and look forward to speaking with you again next year. I, as well as your care team,  appreciate your ongoing commitment to your health goals. Please review the following plan we discussed and let me know if I can assist you in the future. Your Game plan/ To Do List    Referrals: If you haven't heard from the office you've been referred to, please reach out to them at the phone provided.  Your Dexa has been ordered. Remember to update your flu vaccine.  You have an order for:  []   2D Mammogram  []   3D Mammogram  [x]   Bone Density     Please call for appointment:  [facility]  [address] [place] [address] [phone]     Make sure to wear two-piece clothing.  No lotions, powders, or deodorants the day of the appointment. Make sure to bring picture ID and insurance card.  Bring list of medications you are currently taking including any supplements.    Follow up Visits: We will see or speak with you next year for your Next Medicare AWV with our clinical staff 02/24/25 @ 1:00 Have you seen your provider in the last 6 months (3 months if uncontrolled diabetes)? Yes  Clinician Recommendations:  Aim for 30 minutes of exercise or brisk walking, 6-8 glasses of water, and 5 servings of fruits and vegetables each day.       This is a list of the screenings recommended for you:  Health Maintenance  Topic Date Due   COVID-19 Vaccine (4 - 2024-25 season) 03/10/2023   Flu Shot  02/07/2024   Mammogram  07/28/2024   Medicare Annual Wellness Visit  02/23/2025   DTaP/Tdap/Td vaccine (3 - Td or Tdap) 03/23/2031   Pneumococcal Vaccine for age over 60  Completed   DEXA scan (bone density measurement)  Completed   Zoster (Shingles) Vaccine  Completed   HPV Vaccine  Aged Out   Meningitis B Vaccine   Aged Out   Pneumococcal Vaccine  Discontinued   Hepatitis C Screening  Discontinued    Advanced directives: (Declined) Advance directive discussed with you today. Even though you declined this today, please call our office should you change your mind, and we can give you the proper paperwork for you to fill out. Advance Care Planning is important because it:  [x]  Makes sure you receive the medical care that is consistent with your values, goals, and preferences  [x]  It provides guidance to your family and loved ones and reduces their decisional burden about whether or not they are making the right decisions based on your wishes.

## 2024-03-12 ENCOUNTER — Other Ambulatory Visit: Payer: Self-pay | Admitting: Internal Medicine

## 2024-04-21 ENCOUNTER — Other Ambulatory Visit (INDEPENDENT_AMBULATORY_CARE_PROVIDER_SITE_OTHER): Payer: Self-pay | Admitting: Vascular Surgery

## 2024-04-21 DIAGNOSIS — M7989 Other specified soft tissue disorders: Secondary | ICD-10-CM

## 2024-04-21 DIAGNOSIS — E785 Hyperlipidemia, unspecified: Secondary | ICD-10-CM

## 2024-04-21 DIAGNOSIS — N1831 Chronic kidney disease, stage 3a: Secondary | ICD-10-CM

## 2024-04-21 DIAGNOSIS — I890 Lymphedema, not elsewhere classified: Secondary | ICD-10-CM

## 2024-04-23 ENCOUNTER — Other Ambulatory Visit (INDEPENDENT_AMBULATORY_CARE_PROVIDER_SITE_OTHER)

## 2024-04-23 ENCOUNTER — Ambulatory Visit (INDEPENDENT_AMBULATORY_CARE_PROVIDER_SITE_OTHER): Admitting: Vascular Surgery

## 2024-04-23 DIAGNOSIS — M7989 Other specified soft tissue disorders: Secondary | ICD-10-CM | POA: Diagnosis not present

## 2024-04-23 DIAGNOSIS — I890 Lymphedema, not elsewhere classified: Secondary | ICD-10-CM

## 2024-04-29 DIAGNOSIS — R600 Localized edema: Secondary | ICD-10-CM | POA: Diagnosis not present

## 2024-04-29 DIAGNOSIS — N2581 Secondary hyperparathyroidism of renal origin: Secondary | ICD-10-CM | POA: Diagnosis not present

## 2024-05-04 DIAGNOSIS — N2581 Secondary hyperparathyroidism of renal origin: Secondary | ICD-10-CM | POA: Diagnosis not present

## 2024-05-04 DIAGNOSIS — I129 Hypertensive chronic kidney disease with stage 1 through stage 4 chronic kidney disease, or unspecified chronic kidney disease: Secondary | ICD-10-CM | POA: Diagnosis not present

## 2024-05-04 DIAGNOSIS — N189 Chronic kidney disease, unspecified: Secondary | ICD-10-CM | POA: Diagnosis not present

## 2024-05-04 DIAGNOSIS — R600 Localized edema: Secondary | ICD-10-CM | POA: Diagnosis not present

## 2024-05-04 DIAGNOSIS — N1831 Chronic kidney disease, stage 3a: Secondary | ICD-10-CM | POA: Diagnosis not present

## 2024-05-04 NOTE — Progress Notes (Addendum)
 Patient Name: Mary Vasquez, female   Patient DOB: 02-25-1941 Date of Service: 05/04/2024  Patient MRN: [id number] Provider Creating Note: Saralee Stank, MD  [phone] Primary Care Physician: Marylynn Boyer, MD  [address] [place] [address] Additional Physicians/ Providers:   Chief Complaint   Chief Complaint  Patient presents with  . Follow-up    History of Present Illness Mary Vasquez is a 83 y.o. 1-White female who is being evaluated today for CKD She has medical problems of hypothyroidism, depression, hyperlipidemia, remote history of kidney stone 20 years ago Chronic lower extremity edema/lymphedema Prediabetes ===========================  Patient presents for follow-up of chronic kidney disease, hypertension, edema and secondary hyperparathyroidism. She was recently evaluated at vascular surgery office for lymphedema and was prescribed graduated compression stockings. She is also trying to keep her legs elevated.  Taking furosemide  once a day. Review of labs from 04/29/2024 show creatinine of 1.14/GFR 48.  Creatinine ranges between 1.04-1.19 so this appears to be at her baseline.  PTH mildly elevated at 137.  Hemoglobin was in the normal range.  Urinalysis is normal and urine protein to creatinine ratio is normal. Blood pressure today is 145/86 sitting and 132/87 standing. Weight is essentially unchanged since last year. Patient is a non-smoker. For exercise, she goes to strength training on Tuesday.  On Thursday she does a pound class.  She also does chair yoga sometimes. Currently her granddaughter is living with her temporarily.  Vitals BP 132/87 (BP Location: Right upper arm, Patient Position: Standing)   Pulse 75   Temp 97.9 F   Wt 161 lb (73 kg)   SpO2 98%   BMI 29.93 kg/m   Vitals reviewed. Constitutional: She appears well-developed. No distress.  Cardiovascular:  Normal rate, regular rhythm and normal heart sounds.          She exhibits  edema.  Pulmonary/Chest: Effort normal and breath sounds normal. No respiratory distress.  Abdominal: Soft. There is no abdominal tenderness. No hernia.  Skin: Skin is warm and dry.  Psychiatric: She has a normal mood and affect. Her behavior is normal.       Medications   Current Outpatient Medications:  .  atorvastatin  (LIPITOR) 20 MG tablet, Take 20 mg by mouth 1 (one) time each day, Disp: , Rfl:  .  cetirizine (ZyrTEC) 10 MG tablet, Take 10 mg by mouth 1 (one) time each day, Disp: , Rfl:  .  cholecalciferol  (VITAMIN D -3) 25 MCG (1000 UT) capsule, Take 1,000 Units by mouth in the morning and 1,000 Units in the evening., Disp: , Rfl:  .  cyanocobalamin  (VITAMIN B-12) 1000 MCG tablet, Take 1,000 mcg by mouth in the morning and 1,000 mcg in the evening., Disp: , Rfl:  .  furosemide  (LASIX ) 20 MG tablet, Take 20 mg by mouth 1 (one) time each day, Disp: , Rfl:  .  levothyroxine  (SYNTHROID , LEVOTHROID) 25 MCG tablet, Take 25 mcg by mouth 1 (one) time each day, Disp: , Rfl:  .  saccharomyces boulardii (FLORASTOR) 250 MG capsule, Take 250 mg by mouth daily, Disp: , Rfl:  .  sertraline  (ZOLOFT ) 50 MG tablet, Take 1 tablet by mouth 1 (one) time each day, Disp: , Rfl:  .  Turmeric Curcumin 500 MG capsule, Take by mouth 1 (one) time each day, Disp: , Rfl:    Allergies Hydrocodone, Penicillin g, Bupropion, Escitalopram, and Amoxicillin-pot clavulanate  Imaging and Other Studies  CLINICAL DATA:  Initial evaluation for stage III A chronic kidney  disease.   EXAM:  RENAL / URINARY TRACT ULTRASOUND COMPLETE   COMPARISON:  None available.   FINDINGS:  Right Kidney:   Renal measurements: 10.4 x 5.3 x 5.5 cm = volume: 159.3 mL.  Increased echogenicity seen within the renal parenchyma. No  nephrolithiasis or hydronephrosis. No focal renal mass.   Left Kidney:   Renal measurements: 10.5 x 4.6 x 4.4 cm = volume: 113.4 mL.  Increased echogenicity seen within the renal parenchyma. No   nephrolithiasis or hydronephrosis. No focal renal mass.   Bladder:   Appears normal for degree of bladder distention.   Other:   None.   IMPRESSION:  1. Increased echogenicity within the renal parenchyma, compatible  with medical renal disease.  2. No hydronephrosis or other significant finding.    Electronically Signed    By: Morene Hoard M.D.    On: 04/25/2020 17:22   Laboratory Studies   04/17/2022-creatinine 1.19, GFR 46, PTH 126, urine protein to creatinine ratio 0.086 04/25/2023-creatinine 1.04, GFR 54, PTH 102, hemoglobin 13.8, urine protein to creatinine ratio 0.087 04/29/2024-creatinine 1.14, GFR 48, magnesium  2.1, uric acid 6.8, PTH 137, phosphorus 4.1, hemoglobin 13.2, urinalysis normal, urine protein to creatinine ratio normal.  Impression/Recommendations   Patient is a 83 y.o. 1-White female   1. Chronic kidney disease due to hypertension   2. Stage 3a chronic kidney disease (HCC)   3. Edema of lower extremity   4. Hyperparathyroidism due to renal insufficiency (HCC)      Stage IIIa CKD-likely secondary to hypertension, atherosclerosis, advanced age.  Renal function is stable.  No changes made today.  Continue heart healthy lifestyle. Lower extremity edema-chronic edema, likely component of lymphedema.  Managed with Compression socks.  Low-salt diet.  Recommended to continue furosemide  once daily.  Hypertension-sitting blood pressure 145/86, standing blood pressure 132/87.  It is acceptable for age and general condition.   Patient is monitoring her blood pressure at home. If BP is still high, consider, low dose Losartan. Secondary hyperparathyroidism.  Continue over-the-counter vitamin D3.  PTH level has improved.   Return in about 10 months (around 03/04/2025).   Saralee Stank, MD [facility] [address] [place] [address] Ph: [phone] Fax: [phone] 05/04/2024  The following portions of  the patient's chart were reviewed in this encounter and updated as appropriate:  Allergies  Meds  Problems  Med Hx  Surg Hx  Fam Hx       Orders Placed This Encounter  . CBC and Differential  . PTH, Intact  . Renal Function Panel  . Uric Acid  . Urinalysis with microscopic  . Urine Albumin / Creatinine Ratio  . Magnesium 

## 2024-05-17 NOTE — Progress Notes (Unsigned)
 MRN : [id number]  [name] is a 83 y.o. (Jan 14, 1941) female who presents with chief complaint of legs swell.  History of Present Illness:   The patient returns to the office for followup evaluation regarding leg swelling.  The swelling has persisted and the pain associated with swelling continues. There have not been any interval development of a ulcerations or wounds.  Since the previous visit the patient has been wearing graduated compression stockings and has noted little if any improvement in the lymphedema. The patient has been using compression routinely morning until night.  The patient also states elevation during the day and exercise is being done too.  No outpatient medications have been marked as taking for the 05/18/24 encounter (Appointment) with Mary, Cordella MATSU, MD.    Past Medical History:  Diagnosis Date   Arthritis    Depression    situational   Family history of adverse reaction to anesthesia    Mother - altered mental state for a day or 2(age 34)   History of kidney stones    Hyperlipidemia    Hypertension    Hypothyroidism    Vertigo    none for several years    Past Surgical History:  Procedure Laterality Date   BREAST BIOPSY Left 2009   CORE - NEG...marker in breast   BREAST SURGERY  2009   stereotactic,  left,  benign    CATARACT EXTRACTION W/PHACO Left 11/25/2019   Procedure: CATARACT EXTRACTION PHACO AND INTRAOCULAR LENS PLACEMENT (IOC) LEFT 9.58 01:13.8 12.9%;  Surgeon: Mittie Gaskin, MD;  Location: 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   Procedure: TOTAL HIP ARTHROPLASTY ANTERIOR APPROACH;  Surgeon: Mary Lynwood SAUNDERS, MD;  Location: [facility];  Service: Orthopedics;  Laterality: Right;    Social History Social History   Tobacco Use   Smoking status: Never   Smokeless tobacco: Never  Vaping Use   Vaping status: Never Used  Substance Use Topics   Alcohol use: Yes    Alcohol/week: 3.0 standard drinks of alcohol    Types: 3 Glasses of wine per week   Drug use: No    Family History Family History  Problem Relation Age of Onset   Heart disease Mother 19       ami   Heart disease Father        ami   Diabetes Brother    Cancer Brother        prostate ca   Breast cancer Paternal Aunt        50'S   Breast cancer Cousin     Allergies  Allergen Reactions   Hydrocodone Nausea And Vomiting   Penicillin G Hives and Other (See Comments)    Only had reaction to Augmentin DID THE REACTION INVOLVE: Swelling of the face/tongue/throat, SOB, or low BP? Unknown Sudden or severe rash/hives, skin peeling, or the inside of the mouth or nose? Unknown Did it require medical treatment? Unknown When  did it last happen? Unknown If all above answers are NO, may proceed with cephalosporin use.    Lexapro [Escitalopram]     Made her feel like a zombie    Wellbutrin [Bupropion]     Made hyperactive    Augmentin [Amoxicillin-Pot Clavulanate] Hives     REVIEW OF SYSTEMS (Negative unless checked)  Constitutional: [] Weight loss  [] Fever  [] Chills Cardiac: [] Chest pain   [] Chest pressure   [] Palpitations   [] Shortness of breath when laying flat   [] Shortness of breath with exertion. Vascular:  [] Pain in legs with walking   [x] Pain in legs with standing  [] History of DVT   [] Phlebitis   [x] Swelling in legs   [] Varicose veins   [] Non-healing ulcers Pulmonary:   [] Uses home oxygen   [] Productive cough   [] Hemoptysis   [] Wheeze  [] COPD   [] Asthma Neurologic:  [] Dizziness   [] Seizures   [] History of stroke    [] History of TIA  [] Aphasia   [] Vissual changes   [] Weakness or numbness in arm   [] Weakness or numbness in leg Musculoskeletal:   [] Joint swelling   [] Joint pain   [] Low back pain Hematologic:  [] Easy bruising  [] Easy bleeding   [] Hypercoagulable state   [] Anemic Gastrointestinal:  [] Diarrhea   [] Vomiting  [] Gastroesophageal reflux/heartburn   [] Difficulty swallowing. Genitourinary:  [] Chronic kidney disease   [] Difficult urination  [] Frequent urination   [] Blood in urine Skin:  [] Rashes   [] Ulcers  Psychological:  [] History of anxiety   []  History of major depression.  Physical Examination  There were no vitals filed for this visit. There is no height or weight on file to calculate BMI. Gen: WD/WN, NAD Head: [place]/AT, No temporalis wasting.  Ear/Nose/Throat: Hearing grossly intact, nares w/o erythema or drainage, pinna without lesions Eyes: PER, EOMI, sclera nonicteric.  Neck: Supple, no gross masses.  No JVD.  Pulmonary:  Good air movement, no audible wheezing, no use of accessory muscles.  Cardiac: RRR, precordium not hyperdynamic. Vascular:  scattered varicosities present bilaterally.  Mild venous stasis changes to the legs bilaterally.  3-4+ soft pitting edema, CEAP [id number]  Vessel Right Left  Radial Palpable Palpable  Gastrointestinal: soft, non-distended. No guarding/no peritoneal signs.  Musculoskeletal: M/S 5/5 throughout.  No deformity.  Neurologic: CN 2-12 intact. Pain and light touch intact in extremities.  Symmetrical.  Speech is fluent. Motor exam as listed above. Psychiatric: Judgment intact, Mood & affect appropriate for pt's clinical situation. Dermatologic: Venous rashes no ulcers noted.  No changes consistent with cellulitis. Lymph : No lichenification or skin changes of chronic lymphedema.  CBC Lab Results  Component Value Date   WBC 4.7 04/19/2023   HGB 13.3 04/19/2023   HCT 40.5 04/19/2023   MCV 93.7 04/19/2023   PLT 194.0 04/19/2023    BMET     Component Value Date/Time   NA 139 01/01/2024 1034   K 3.8 01/01/2024 1034   CL 103 01/01/2024 1034   CO2 29 01/01/2024 1034   GLUCOSE 99 01/01/2024 1034   BUN 20 01/01/2024 1034   CREATININE 1.05 01/01/2024 1034   CALCIUM  9.6 01/01/2024 1034   GFRNONAA >60 08/14/2018 0323   GFRAA >60 08/14/2018 0323   CrCl cannot be calculated (Patient's most recent lab result is older than the maximum 21 days allowed.).  COAG Lab Results  Component Value Date   INR 1.11 07/31/2018    Radiology VAS US  LOWER EXTREMITY VENOUS REFLUX Result Date: 04/27/2024  Lower Venous Reflux Study Patient Name:  Dayami C  Mummert  Date of Exam:   04/23/2024 Medical Rec #: [id number]      Accession #:    [id number] Date of Birth: 1941-01-31      Patient Gender: F Patient Age:   77 years Exam Location:  [facility] Procedure:      VAS US  LOWER EXTREMITY VENOUS REFLUX Referring Phys: CORDELLA SHAWL --------------------------------------------------------------------------------  Indications: Swelling.  Performing Technologist: Leafy Gibes RVS  Examination Guidelines: A complete evaluation includes B-mode imaging, spectral Doppler, color Doppler, and power Doppler as needed of all accessible portions of each vessel. Bilateral testing is considered an integral part of a complete examination. Limited examinations for reoccurring indications may be performed as noted. The reflux portion of the exam is performed with the patient in reverse Trendelenburg. Significant venous reflux is defined as >500 ms in the superficial venous system, and >1 second in the deep venous system.  Venous Reflux Times +--------------+---------+------+-----------+------------+--------+ RIGHT         Reflux NoRefluxReflux TimeDiameter cmsComments                         Yes                                  +--------------+---------+------+-----------+------------+--------+ CFV           no                                              +--------------+---------+------+-----------+------------+--------+ FV prox       no                                             +--------------+---------+------+-----------+------------+--------+ FV mid        no                                             +--------------+---------+------+-----------+------------+--------+ FV dist       no                                             +--------------+---------+------+-----------+------------+--------+ Popliteal     no                                             +--------------+---------+------+-----------+------------+--------+ GSV at SFJ    no                            .34              +--------------+---------+------+-----------+------------+--------+ GSV prox thighno                            .47              +--------------+---------+------+-----------+------------+--------+ GSV mid  thigh no                            .36              +--------------+---------+------+-----------+------------+--------+ GSV dist thighno                            .42              +--------------+---------+------+-----------+------------+--------+ GSV at knee   no                            .38              +--------------+---------+------+-----------+------------+--------+ GSV prox calf no                            .26              +--------------+---------+------+-----------+------------+--------+ SSV at [place]    no                            .24              +--------------+---------+------+-----------+------------+--------+  +--------------+---------+------+-----------+------------+--------+ LEFT          Reflux NoRefluxReflux TimeDiameter cmsComments                         Yes                                  +--------------+---------+------+-----------+------------+--------+ CFV           no                                              +--------------+---------+------+-----------+------------+--------+ FV prox       no                                             +--------------+---------+------+-----------+------------+--------+ FV mid        no                                             +--------------+---------+------+-----------+------------+--------+ FV dist       no                                             +--------------+---------+------+-----------+------------+--------+ Popliteal     no                                             +--------------+---------+------+-----------+------------+--------+ GSV at SFJ    no                            .  46              +--------------+---------+------+-----------+------------+--------+ GSV prox thighno                            .40              +--------------+---------+------+-----------+------------+--------+ GSV mid thigh no                            .42              +--------------+---------+------+-----------+------------+--------+ GSV dist thighno                            .54              +--------------+---------+------+-----------+------------+--------+ GSV at knee   no                            .45              +--------------+---------+------+-----------+------------+--------+ GSV prox calf no                            .33              +--------------+---------+------+-----------+------------+--------+ SSV at [facility]    no                            .28              +--------------+---------+------+-----------+------------+--------+   Summary: Bilateral: - No evidence of deep vein thrombosis seen in the lower extremities, bilaterally, from the common femoral through the popliteal veins. - No evidence of superficial venous thrombosis in the lower extremities, bilaterally. - No evidence of deep venous insufficiency seen bilaterally in the lower extremity. - No evidence of superficial venous reflux seen in the  greater saphenous veins bilaterally. - No evidence of superficial venous reflux seen in the short saphenous veins bilaterally.  *See table(s) above for measurements and observations. Electronically signed by Cordella Shawl MD on 04/27/2024 at 11:17:10 AM.    Final      Assessment/Plan There are no diagnoses linked to this encounter.   Cordella Shawl, MD  05/17/2024 7:48 PM

## 2024-05-18 ENCOUNTER — Ambulatory Visit (INDEPENDENT_AMBULATORY_CARE_PROVIDER_SITE_OTHER): Admitting: Vascular Surgery

## 2024-05-18 ENCOUNTER — Encounter (INDEPENDENT_AMBULATORY_CARE_PROVIDER_SITE_OTHER): Payer: Self-pay | Admitting: Vascular Surgery

## 2024-05-18 VITALS — BP 151/89 | HR 64 | Resp 18 | Wt 162.4 lb

## 2024-05-18 DIAGNOSIS — N1831 Chronic kidney disease, stage 3a: Secondary | ICD-10-CM | POA: Diagnosis not present

## 2024-05-18 DIAGNOSIS — E785 Hyperlipidemia, unspecified: Secondary | ICD-10-CM | POA: Diagnosis not present

## 2024-05-18 DIAGNOSIS — E034 Atrophy of thyroid (acquired): Secondary | ICD-10-CM

## 2024-05-18 DIAGNOSIS — I890 Lymphedema, not elsewhere classified: Secondary | ICD-10-CM | POA: Diagnosis not present

## 2024-06-05 ENCOUNTER — Other Ambulatory Visit: Payer: Self-pay | Admitting: Internal Medicine

## 2024-06-14 ENCOUNTER — Other Ambulatory Visit: Payer: Self-pay | Admitting: Internal Medicine

## 2024-07-06 ENCOUNTER — Ambulatory Visit: Admitting: Internal Medicine

## 2024-08-12 ENCOUNTER — Ambulatory Visit: Admitting: Internal Medicine

## 2024-09-10 ENCOUNTER — Ambulatory Visit: Admitting: Internal Medicine

## 2024-11-16 ENCOUNTER — Ambulatory Visit (INDEPENDENT_AMBULATORY_CARE_PROVIDER_SITE_OTHER): Admitting: Vascular Surgery

## 2025-02-24 ENCOUNTER — Ambulatory Visit
